# Patient Record
Sex: Male | Born: 1955 | Race: White | Hispanic: No | Smoking: Never smoker
Health system: Southern US, Community
[De-identification: ages and names within clinical notes are randomized; demographics above are authoritative.]

## PROBLEM LIST (undated history)

## (undated) DIAGNOSIS — Z8669 Personal history of other diseases of the nervous system and sense organs: Secondary | ICD-10-CM

## (undated) DIAGNOSIS — C73 Malignant neoplasm of thyroid gland: Secondary | ICD-10-CM

## (undated) DIAGNOSIS — I712 Thoracic aortic aneurysm, without rupture: Secondary | ICD-10-CM

## (undated) DIAGNOSIS — E119 Type 2 diabetes mellitus without complications: Secondary | ICD-10-CM

## (undated) DIAGNOSIS — K2970 Gastritis, unspecified, without bleeding: Secondary | ICD-10-CM

## (undated) DIAGNOSIS — B9681 Helicobacter pylori [H. pylori] as the cause of diseases classified elsewhere: Secondary | ICD-10-CM

## (undated) DIAGNOSIS — I10 Essential (primary) hypertension: Secondary | ICD-10-CM

## (undated) DIAGNOSIS — K259 Gastric ulcer, unspecified as acute or chronic, without hemorrhage or perforation: Secondary | ICD-10-CM

## (undated) DIAGNOSIS — L817 Pigmented purpuric dermatosis: Secondary | ICD-10-CM

## (undated) DIAGNOSIS — F410 Panic disorder [episodic paroxysmal anxiety] without agoraphobia: Secondary | ICD-10-CM

## (undated) DIAGNOSIS — M359 Systemic involvement of connective tissue, unspecified: Secondary | ICD-10-CM

## (undated) HISTORY — DX: Gastric ulcer, unspecified as acute or chronic, without hemorrhage or perforation: K25.9

## (undated) HISTORY — DX: Personal history of other diseases of the nervous system and sense organs: Z86.69

## (undated) HISTORY — DX: Panic disorder (episodic paroxysmal anxiety): F41.0

## (undated) HISTORY — DX: Type 2 diabetes mellitus without complications: E11.9

## (undated) HISTORY — DX: Malignant neoplasm of thyroid gland: C73

## (undated) HISTORY — DX: Essential (primary) hypertension: I10

## (undated) HISTORY — DX: Hypocalcemia: E83.51

## (undated) HISTORY — DX: Helicobacter pylori (H. pylori) as the cause of diseases classified elsewhere: B96.81

## (undated) HISTORY — DX: Gastritis, unspecified, without bleeding: K29.70

## (undated) HISTORY — DX: Thoracic aortic aneurysm, without rupture: I71.2

## (undated) HISTORY — DX: Pigmented purpuric dermatosis: L81.7

## (undated) HISTORY — PX: MENISCUS REPAIR: SHX5179

## (undated) HISTORY — PX: COLONOSCOPY: SHX174

---

## 2005-04-23 ENCOUNTER — Ambulatory Visit: Payer: Self-pay | Admitting: Internal Medicine

## 2005-09-22 ENCOUNTER — Ambulatory Visit: Payer: Self-pay | Admitting: Family Medicine

## 2006-11-22 DIAGNOSIS — Z720 Tobacco use: Secondary | ICD-10-CM | POA: Insufficient documentation

## 2006-11-26 DIAGNOSIS — K219 Gastro-esophageal reflux disease without esophagitis: Secondary | ICD-10-CM | POA: Insufficient documentation

## 2006-11-26 DIAGNOSIS — G43909 Migraine, unspecified, not intractable, without status migrainosus: Secondary | ICD-10-CM | POA: Insufficient documentation

## 2006-12-26 DIAGNOSIS — E119 Type 2 diabetes mellitus without complications: Secondary | ICD-10-CM | POA: Insufficient documentation

## 2007-08-30 ENCOUNTER — Ambulatory Visit: Payer: Self-pay | Admitting: Pulmonary Disease

## 2007-08-30 DIAGNOSIS — G610 Guillain-Barre syndrome: Secondary | ICD-10-CM

## 2007-08-30 DIAGNOSIS — Z8669 Personal history of other diseases of the nervous system and sense organs: Secondary | ICD-10-CM | POA: Insufficient documentation

## 2007-08-30 DIAGNOSIS — R51 Headache: Secondary | ICD-10-CM

## 2007-08-30 DIAGNOSIS — M129 Arthropathy, unspecified: Secondary | ICD-10-CM | POA: Insufficient documentation

## 2007-08-30 DIAGNOSIS — E785 Hyperlipidemia, unspecified: Secondary | ICD-10-CM

## 2007-08-30 DIAGNOSIS — E119 Type 2 diabetes mellitus without complications: Secondary | ICD-10-CM

## 2007-08-30 DIAGNOSIS — I10 Essential (primary) hypertension: Secondary | ICD-10-CM

## 2007-08-31 DIAGNOSIS — G2581 Restless legs syndrome: Secondary | ICD-10-CM

## 2007-09-15 DIAGNOSIS — C73 Malignant neoplasm of thyroid gland: Secondary | ICD-10-CM

## 2007-09-15 HISTORY — DX: Malignant neoplasm of thyroid gland: C73

## 2007-09-15 HISTORY — PX: THYROIDECTOMY: SHX17

## 2007-09-21 ENCOUNTER — Ambulatory Visit: Payer: Self-pay | Admitting: Pulmonary Disease

## 2008-01-12 ENCOUNTER — Ambulatory Visit: Payer: Self-pay | Admitting: Otolaryngology

## 2008-01-31 ENCOUNTER — Ambulatory Visit: Payer: Self-pay | Admitting: Unknown Physician Specialty

## 2008-01-31 ENCOUNTER — Other Ambulatory Visit: Payer: Self-pay

## 2008-02-03 ENCOUNTER — Other Ambulatory Visit: Payer: Self-pay

## 2008-02-03 ENCOUNTER — Inpatient Hospital Stay: Payer: Self-pay | Admitting: Internal Medicine

## 2008-02-12 ENCOUNTER — Other Ambulatory Visit: Payer: Self-pay

## 2008-02-12 ENCOUNTER — Emergency Department: Payer: Self-pay | Admitting: Emergency Medicine

## 2009-11-26 IMAGING — CT CT NECK WITH CONTRAST
1 of 2 series · 9 of 14 positions shown, 12 images · non-contrast
Comparison: none

REASON FOR EXAM: thyroid nodule
COMMENTS:

[Series 2: soft tissue · axial · 0.49mm/px · z∈[-178,+92]mm · 9 of 114 slices shown, 12 images]
[im 12/114  soft-tissue]
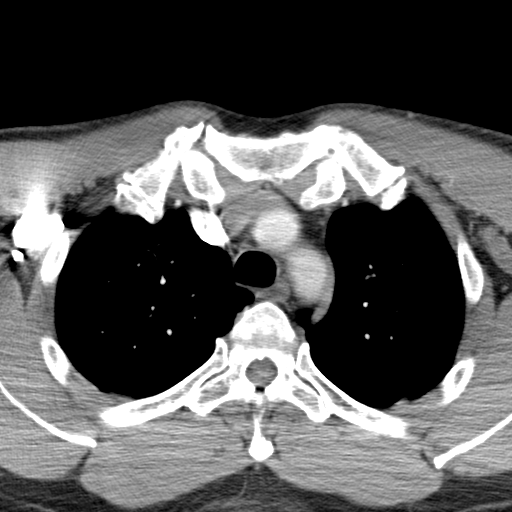
[im 12/114  bone]
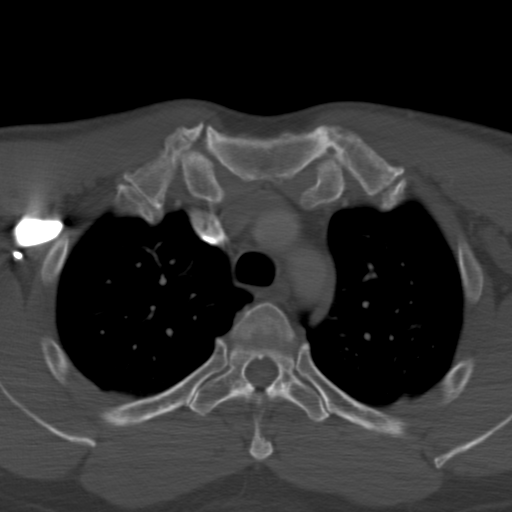
[im 23/114  bone]
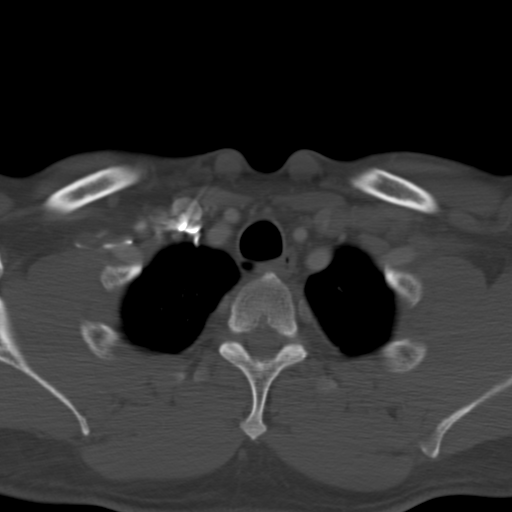
[im 34/114  bone]
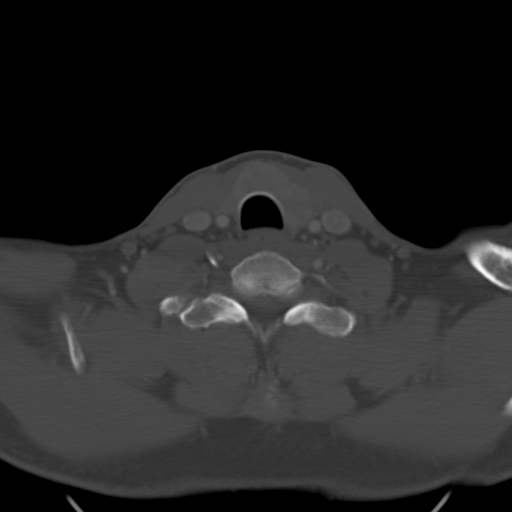
[im 46/114  bone]
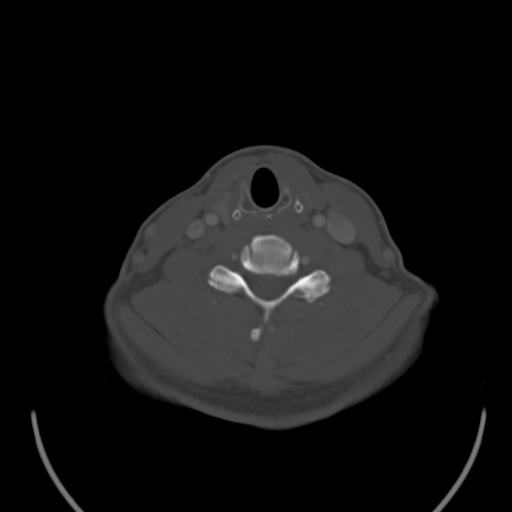
[im 57/114  soft-tissue]
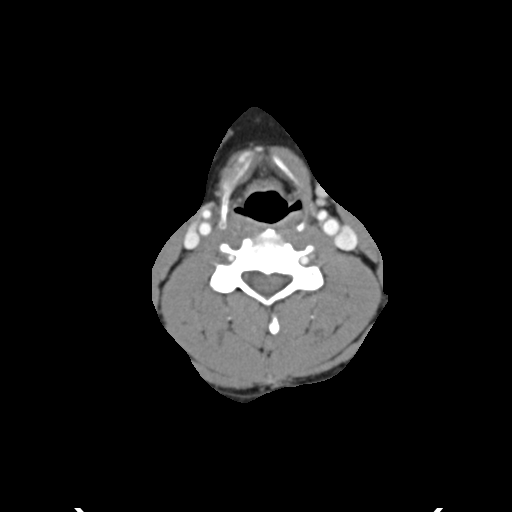
[im 57/114  bone]
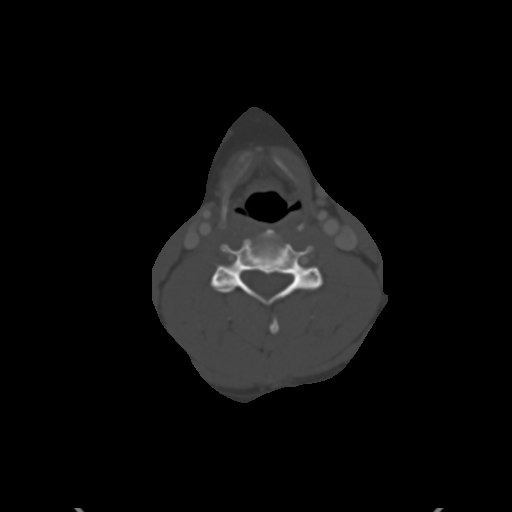
[im 68/114  bone]
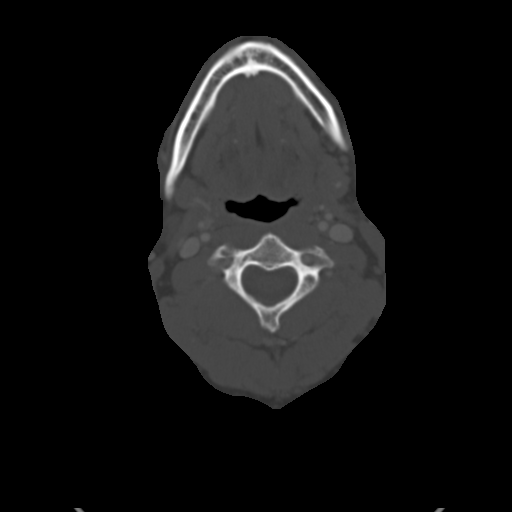
[im 80/114  bone]
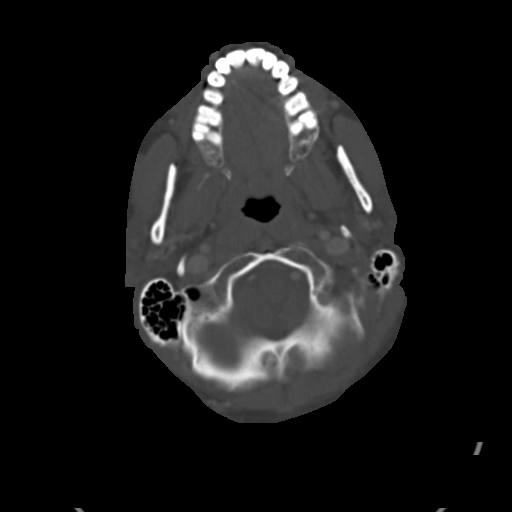
[im 91/114  bone]
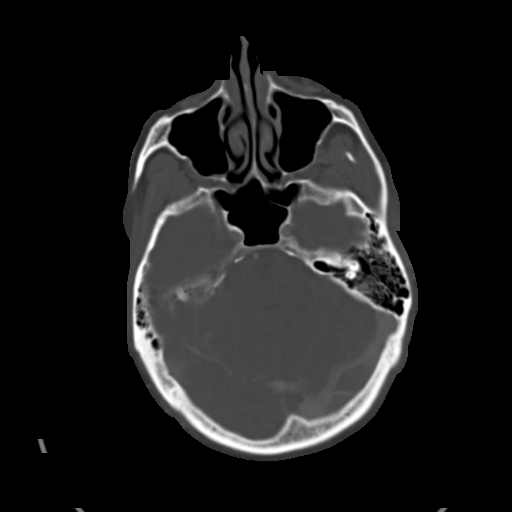
[im 102/114  soft-tissue]
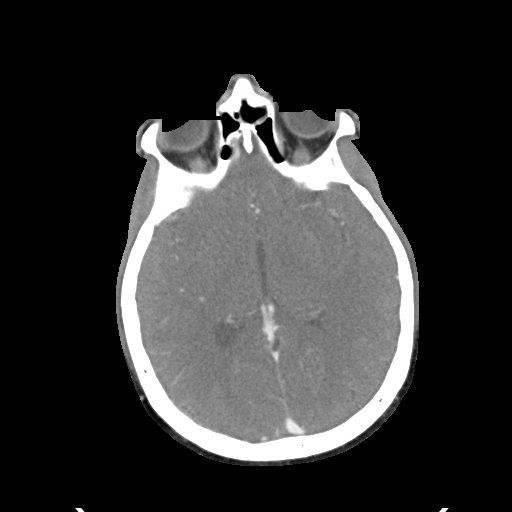
[im 102/114  bone]
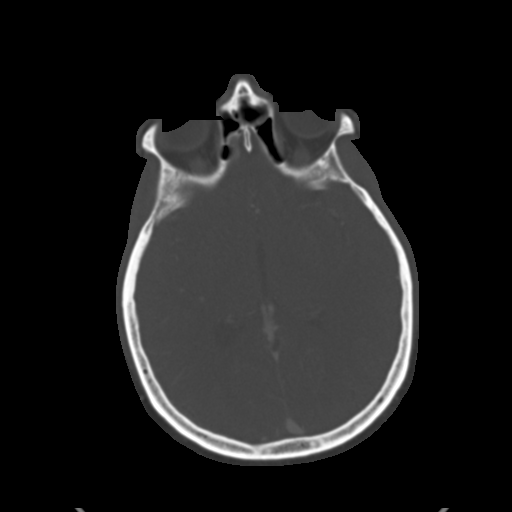

[9 of 14 positions shown; findings below may reference images not displayed]

PROCEDURE:     CT  - CT NECK WITH CONTRAST  - January 12, 2008  [DATE]

RESULT:     A radiodense marker has been placed in the lower anterior neck.
The patient received 75 ml of Wsovue-PYL.

Deep to the marker, the thyroid isthmus is seen to be enlarged. The isthmus
measures approximately 2.5 cm transversely x 1.4 cm AP. The LEFT thyroid
lobe is slightly larger than the RIGHT. There is no intrathoracic goiter.
There is no deviation of the trachea nor of the adjacent vasculature. The
density of the thyroid gland is heterogeneous. I do not see evidence of
cervical lymphadenopathy. The sternocleidomastoid muscles adjacent to the
thyroid gland are symmetric in appearance.

The parotid and submandibular glands exhibit no acute abnormality. The
nasopharyngeal structures and oropharyngeal structures are normal in
appearance. There is a small amount of mucoperiosteal thickening involving
the ethmoid sinuses. The laryngeal structures are normal in appearance.

The pulmonary apices are clear.
IMPRESSION: 1.     There is enlargement of the thyroid isthmus and an overall
heterogeneous appearance of the density of the thyroid gland. Correlation
with the patient's clinical and laboratory values would be of value. Thyroid
ultrasound may be useful as well,especially if biopsy is contemplated. No
discrete nodule is identified.
2.     I see no acute abnormality elsewhere within the neck.
3.     There is a small amount of mucoperiosteal thickening of the ethmoid
sinus cells.

## 2011-07-10 ENCOUNTER — Ambulatory Visit: Payer: Self-pay | Admitting: Orthopedic Surgery

## 2011-07-24 ENCOUNTER — Ambulatory Visit: Payer: Self-pay | Admitting: Orthopedic Surgery

## 2011-07-28 ENCOUNTER — Ambulatory Visit: Payer: Self-pay | Admitting: Orthopedic Surgery

## 2011-07-31 ENCOUNTER — Inpatient Hospital Stay: Payer: Self-pay | Admitting: Internal Medicine

## 2012-02-22 ENCOUNTER — Observation Stay: Payer: Self-pay | Admitting: Internal Medicine

## 2012-02-22 LAB — CBC
HCT: 40.8 % (ref 40.0–52.0)
MCHC: 33.5 g/dL (ref 32.0–36.0)
Platelet: 254 10*3/uL (ref 150–440)
RBC: 4.74 10*6/uL (ref 4.40–5.90)

## 2012-02-22 LAB — CK TOTAL AND CKMB (NOT AT ARMC): CK-MB: 2 ng/mL (ref 0.5–3.6)

## 2012-02-22 LAB — BASIC METABOLIC PANEL
BUN: 12 mg/dL (ref 7–18)
Co2: 29 mmol/L (ref 21–32)
Creatinine: 1.03 mg/dL (ref 0.60–1.30)
EGFR (Non-African Amer.): >60
Glucose: 133 mg/dL — ABNORMAL HIGH (ref 65–99)
Osmolality: 285 (ref 275–301)
Potassium: 4 mmol/L (ref 3.5–5.1)

## 2012-02-23 LAB — BASIC METABOLIC PANEL
Anion Gap: 10 (ref 7–16)
BUN: 16 mg/dL (ref 7–18)
Calcium, Total: 8.4 mg/dL — ABNORMAL LOW (ref 8.5–10.1)
Chloride: 104 mmol/L (ref 98–107)
EGFR (Non-African Amer.): >60
Osmolality: 284 (ref 275–301)
Potassium: 4.3 mmol/L (ref 3.5–5.1)
Sodium: 141 mmol/L (ref 136–145)

## 2012-02-23 LAB — LIPID PANEL
Cholesterol: 220 mg/dL — ABNORMAL HIGH (ref 0–200)
HDL Cholesterol: 36 mg/dL — ABNORMAL LOW (ref 40–60)
Ldl Cholesterol, Calc: 163 mg/dL — ABNORMAL HIGH (ref 0–100)
Triglycerides: 104 mg/dL (ref 0–200)
VLDL Cholesterol, Calc: 21 mg/dL (ref 5–40)

## 2012-02-23 LAB — TSH: Thyroid Stimulating Horm: 1.88 u[IU]/mL

## 2012-02-23 LAB — CK TOTAL AND CKMB (NOT AT ARMC)
CK, Total: 154 U/L (ref 35–232)
CK-MB: 1.9 ng/mL (ref 0.5–3.6)

## 2012-08-31 LAB — HM HEPATITIS C SCREENING LAB: HM Hepatitis Screen: NEGATIVE

## 2013-06-13 IMAGING — CR DG CHEST 1V PORT
1 series · 1 of 1 positions shown · non-contrast
Comparison: none

REASON FOR EXAM: Chest Pain
COMMENTS:

PROCEDURE:     DXR - DXR PORTABLE CHEST SINGLE VIEW  - July 30, 2011 [DATE]
RESULT:     Comparison is made to the prior exam of 02/05/2008. The lung
fields are clear. The heart, mediastinal and osseous structures reveal no
significant abnormalities.

[view not recorded]
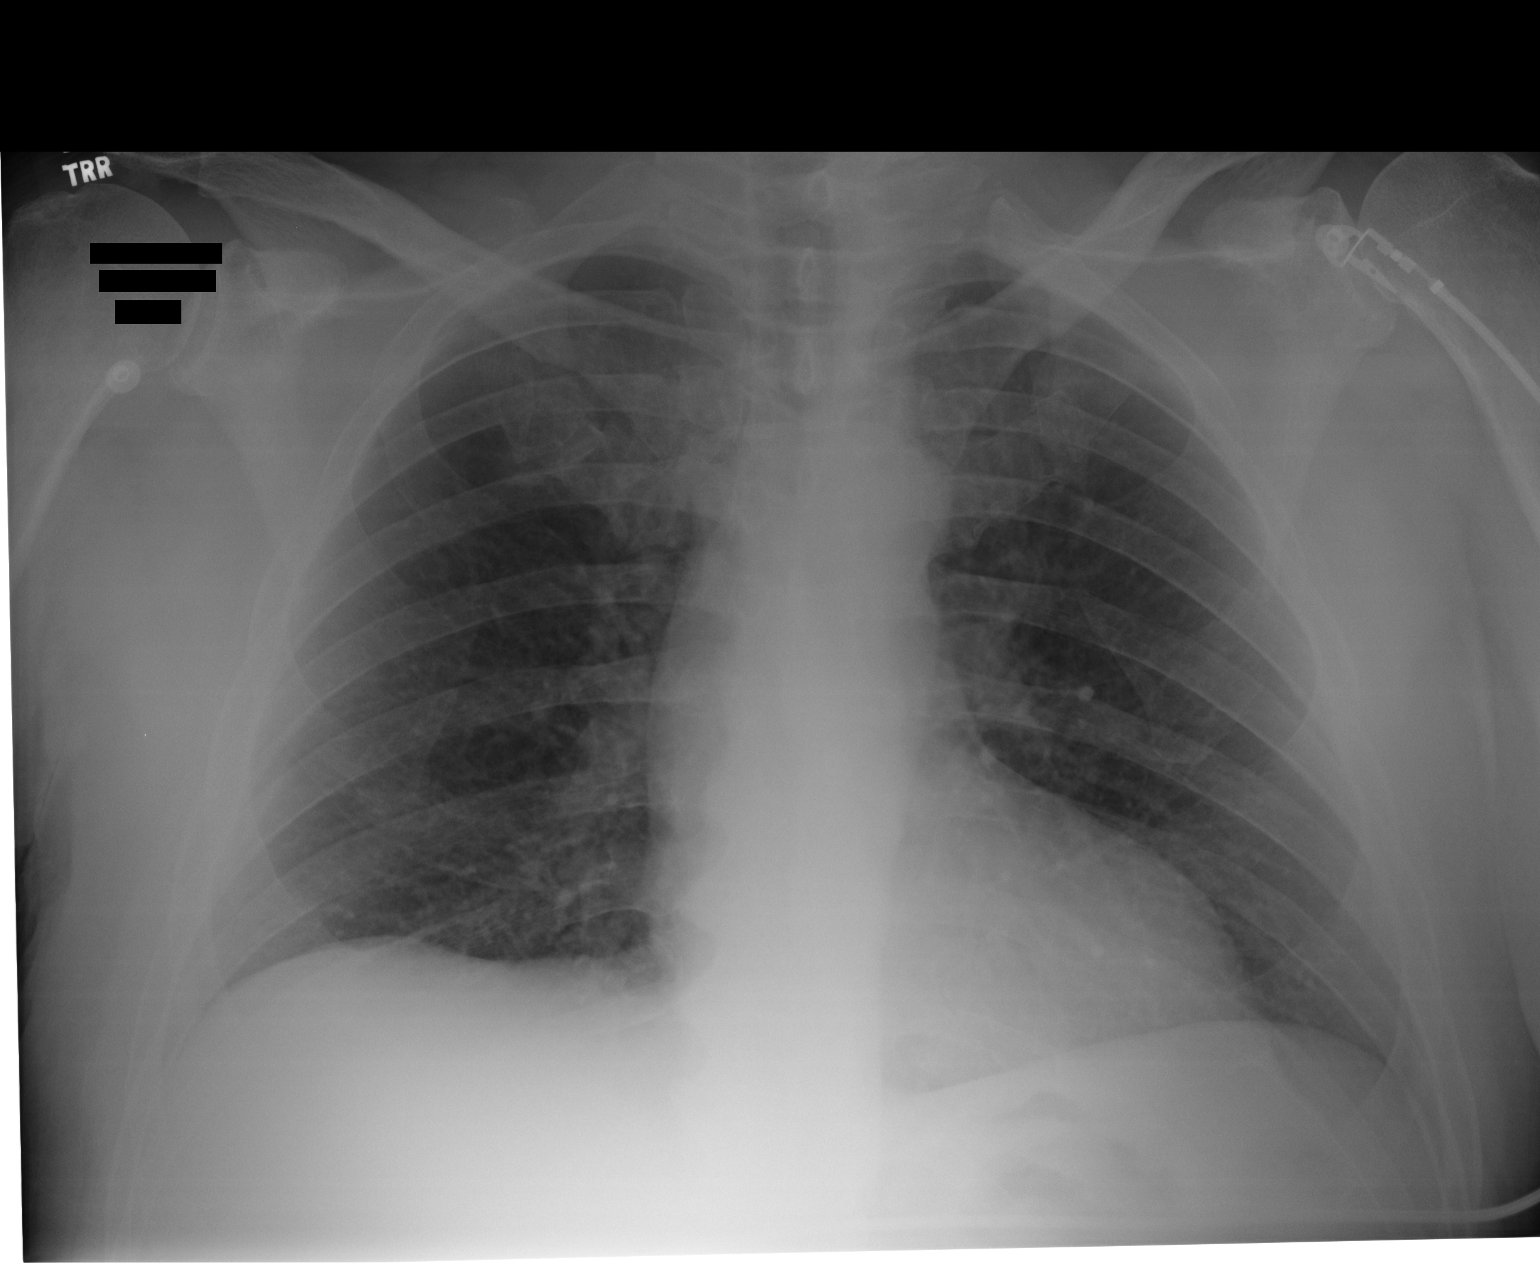

[1 of 1 positions shown; findings below may reference images not displayed]

IMPRESSION: 1.     No significant abnormalities are identified.

## 2014-04-14 DIAGNOSIS — I712 Thoracic aortic aneurysm, without rupture, unspecified: Secondary | ICD-10-CM

## 2014-04-14 HISTORY — DX: Thoracic aortic aneurysm, without rupture, unspecified: I71.20

## 2014-04-14 HISTORY — DX: Thoracic aortic aneurysm, without rupture: I71.2

## 2014-04-18 DIAGNOSIS — I7121 Aneurysm of the ascending aorta, without rupture: Secondary | ICD-10-CM | POA: Insufficient documentation

## 2014-04-18 DIAGNOSIS — R739 Hyperglycemia, unspecified: Secondary | ICD-10-CM | POA: Insufficient documentation

## 2014-04-18 DIAGNOSIS — I712 Thoracic aortic aneurysm, without rupture: Secondary | ICD-10-CM | POA: Insufficient documentation

## 2014-06-04 ENCOUNTER — Ambulatory Visit (INDEPENDENT_AMBULATORY_CARE_PROVIDER_SITE_OTHER): Payer: BC Managed Care – PPO | Admitting: Cardiovascular Disease

## 2014-06-04 ENCOUNTER — Encounter: Payer: Self-pay | Admitting: *Deleted

## 2014-06-04 ENCOUNTER — Encounter (INDEPENDENT_AMBULATORY_CARE_PROVIDER_SITE_OTHER): Payer: Self-pay

## 2014-06-04 ENCOUNTER — Encounter: Payer: Self-pay | Admitting: Cardiovascular Disease

## 2014-06-04 VITALS — BP 145/86 | HR 69 | Ht 71.0 in | Wt 236.8 lb

## 2014-06-04 DIAGNOSIS — R55 Syncope and collapse: Secondary | ICD-10-CM

## 2014-06-04 DIAGNOSIS — I208 Other forms of angina pectoris: Secondary | ICD-10-CM | POA: Insufficient documentation

## 2014-06-04 DIAGNOSIS — I7121 Aneurysm of the ascending aorta, without rupture: Secondary | ICD-10-CM | POA: Insufficient documentation

## 2014-06-04 DIAGNOSIS — I712 Thoracic aortic aneurysm, without rupture, unspecified: Secondary | ICD-10-CM

## 2014-06-04 DIAGNOSIS — I2089 Other forms of angina pectoris: Secondary | ICD-10-CM | POA: Insufficient documentation

## 2014-06-04 DIAGNOSIS — R079 Chest pain, unspecified: Secondary | ICD-10-CM

## 2014-06-04 DIAGNOSIS — Z01812 Encounter for preprocedural laboratory examination: Secondary | ICD-10-CM

## 2014-06-04 DIAGNOSIS — I209 Angina pectoris, unspecified: Secondary | ICD-10-CM

## 2014-06-04 DIAGNOSIS — I10 Essential (primary) hypertension: Secondary | ICD-10-CM

## 2014-06-04 MED ORDER — ASPIRIN EC 81 MG PO TBEC: 81.0000 mg | DELAYED_RELEASE_TABLET | Freq: Every day | ORAL | Status: DC

## 2014-06-04 NOTE — Assessment & Plan Note (Signed)
This is currently being followed at [facility].

## 2014-06-04 NOTE — Assessment & Plan Note (Signed)
The patient's symptoms are highly suggestive of class III angina which started in August. This has been progressive. Given his symptoms, I recommend proceeding with cardiac catheterization and possible coronary intervention. Risks, benefits and alternatives including stress testing were discussed with him.

## 2014-06-04 NOTE — Assessment & Plan Note (Signed)
Blood pressure is reasonably controlled but we might need to be more aggressive given his diagnosis of thoracic aortic aneurysm. I might consider increasing lisinopril or switching metoprolol to carvedilol.

## 2014-06-04 NOTE — Progress Notes (Signed)
Primary care physician: Dr. Venia Minks  HPI  This is a 58 year old man who was referred for evaluation of chest pain and shortness of breath. He reports no previous cardiac history. He has known history of hypertension, hyperlipidemia, newly diagnosed diabetes and Guillain -Barre syndrome. He was hospitalized at [facility] in early August with chest pain and syncope. He was found to be severely hypertensive on presentation with blood pressure 220/138 .  These records are not available but it appears that he ruled out for myocardial infarction. Echocardiogram showed dilated aorta and he was later confirmed to have a thoracic aortic aneurysm measuring 4.4 cm. He is now following up with Dr. Sammuel Hines for this. Since his hospitalization, he reports continued symptoms of substernal chest tightness radiating to his left arm and associated with significant shortness of breath. This is currently happening with any minimal activities and has been progressive. This is unusual for him as he has been active throughout his life. He is a Careers adviser. He denies any previous similar symptoms. He does have family history of coronary artery disease. He is not a smoker.   Allergies  Allergen Reactions  . Declomycin [Demeclocycline]   . Ginger   . Remicade [Infliximab]      No current outpatient prescriptions on file prior to visit.   No current facility-administered medications on file prior to visit.     Past Medical History  Diagnosis Date  . Schamberg disease   . Paresthesia of foot   . Nausea   . Hypocalcemia   . H/O Guillain-Barre syndrome   . Dysthymic disorder   . Psoriatic arthritis   . Hyperlipidemia   . Hypothyroidism   . Gastric ulcer   . Esophageal reflux   . Genital disorder, male   . Insomnia   . Panic disorder   . Colonic constipation   . Vitamin D deficiency   . Syncope and collapse   . Diabetes mellitus without complication   . Thoracic aortic aneurysm     4.4 cm followed at [facility]  .  Essential hypertension      Past Surgical History  Procedure Laterality Date  . Thyroidectomy    . Meniscus repair Right      Family History  Problem Relation Age of Onset  . Hyperlipidemia Mother   . Heart attack Father 4  . Hypertension Father   . Hyperlipidemia Father   . Heart failure Father      History   Social History  . Marital Status: Married    Spouse Name: N/A    Number of Children: N/A  . Years of Education: N/A   Occupational History  . Not on file.   Social History Main Topics  . Smoking status: Never Smoker   . Smokeless tobacco: Not on file  . Alcohol Use: No  . Drug Use: No  . Sexual Activity: Not on file   Other Topics Concern  . Not on file   Social History Narrative  . No narrative on file     ROS A 10 point review of system was performed. It is negative other than that mentioned in the history of present illness.   PHYSICAL EXAM   BP 145/86  Pulse 69  Ht 5\' 11"  (1.803 m)  Wt 236 lb 12 oz (107.389 kg)  BMI 33.03 kg/m2 Constitutional: He is oriented to person, place, and time. He appears well-developed and well-nourished. No distress.  HENT: No nasal discharge.  Head: Normocephalic and atraumatic.  Eyes: Pupils  are equal and round.  No discharge. Neck: Normal range of motion. Neck supple. No JVD present. No thyromegaly present.  Cardiovascular: Normal rate, regular rhythm, normal heart sounds. Exam reveals no gallop and no friction rub. No murmur heard.  Pulmonary/Chest: Effort normal and breath sounds normal. No stridor. No respiratory distress. He has no wheezes. He has no rales. He exhibits no tenderness.  Abdominal: Soft. Bowel sounds are normal. He exhibits no distension. There is no tenderness. There is no rebound and no guarding.  Musculoskeletal: Normal range of motion. He exhibits no edema and no tenderness.  Neurological: He is alert and oriented to person, place, and time. Coordination normal.  Skin: Skin is warm and  dry. No rash noted. He is not diaphoretic. No erythema. No pallor.  Psychiatric: He has a normal mood and affect. His behavior is normal. Judgment and thought content normal.       FXJ:OITGP  Rhythm  WITHIN NORMAL LIMITS   ASSESSMENT AND PLAN

## 2014-06-04 NOTE — Patient Instructions (Addendum)
[facility] Cardiac Cath Instructions   You are scheduled for a Cardiac Cath on:_________________________  Please arrive at _______am on the day of your procedure  You will need to pre-register prior to the day of your procedure.  Enter through the [employer] at [facility].  Registration is the first desk on your right.  Please take the procedure order we have given you in order to be registered appropriately  Do not eat/drink anything after midnight  Someone will need to drive you home  It is recommended someone be with you for the first 24 hours after your procedure  Wear clothes that are easy to get on/off and wear slip on shoes if possible   Medications bring a current list of all medications with you    Day of your procedure: Arrive at the [facility] entrance.  Free valet service is available.  After entering the [facility] please check-in at the registration desk (1st desk on your right) to receive your armband. After receiving your armband someone will escort you to the cardiac cath/special procedures waiting area.  The usual length of stay after your procedure is about 2 to 3 hours.  This can vary.  If you have any questions, please call our office at [phone], or you may call the cardiac cath lab at [facility] directly at [phone]  Your physician recommends that you have labs today:  BMP CBC INR  Your physician has recommended you make the following change in your medication:  Aspirin 81 mg once daily

## 2014-06-05 LAB — BASIC METABOLIC PANEL
BUN/Creatinine Ratio: 20 (ref 9–20)
BUN: 18 mg/dL (ref 6–24)
CHLORIDE: 96 mmol/L — AB (ref 97–108)
CO2: 24 mmol/L (ref 18–29)
Calcium: 8.9 mg/dL (ref 8.7–10.2)
Creatinine, Ser: 0.92 mg/dL (ref 0.76–1.27)
GFR calc Af Amer: 107 mL/min/{1.73_m2} (ref >59)
GFR calc non Af Amer: 93 mL/min/{1.73_m2} (ref >59)
Glucose: 222 mg/dL — ABNORMAL HIGH (ref 65–99)
Potassium: 5 mmol/L (ref 3.5–5.2)
SODIUM: 139 mmol/L (ref 134–144)

## 2014-06-05 LAB — CBC WITH DIFFERENTIAL
Basophils Absolute: 0 10*3/uL (ref 0.0–0.2)
Basos: 0 %
Eos: 3 %
Eosinophils Absolute: 0.2 10*3/uL (ref 0.0–0.4)
HCT: 40.7 % (ref 37.5–51.0)
Hemoglobin: 14.4 g/dL (ref 12.6–17.7)
IMMATURE GRANS (ABS): 0 10*3/uL (ref 0.0–0.1)
IMMATURE GRANULOCYTES: 0 %
Lymphocytes Absolute: 2.4 10*3/uL (ref 0.7–3.1)
Lymphs: 26 %
MCH: 29.6 pg (ref 26.6–33.0)
MCHC: 35.4 g/dL (ref 31.5–35.7)
MCV: 84 fL (ref 79–97)
MONOS ABS: 0.5 10*3/uL (ref 0.1–0.9)
Monocytes: 5 %
NEUTROS PCT: 66 %
Neutrophils Absolute: 6.2 10*3/uL (ref 1.4–7.0)
PLATELETS: 283 10*3/uL (ref 150–379)
RBC: 4.87 x10E6/uL (ref 4.14–5.80)
RDW: 13.3 % (ref 12.3–15.4)
WBC: 9.3 10*3/uL (ref 3.4–10.8)

## 2014-06-05 LAB — PROTIME-INR
INR: 1 (ref 0.8–1.2)
Prothrombin Time: 10.6 s (ref 9.1–12.0)

## 2014-06-06 ENCOUNTER — Telehealth: Payer: Self-pay | Admitting: *Deleted

## 2014-06-06 NOTE — Telephone Encounter (Signed)
Cardiac cath orders faxed  Ebony Hail confirmed receipt

## 2014-06-07 ENCOUNTER — Ambulatory Visit: Payer: Self-pay | Admitting: Cardiovascular Disease

## 2014-06-07 DIAGNOSIS — I209 Angina pectoris, unspecified: Secondary | ICD-10-CM

## 2014-06-07 HISTORY — PX: CARDIAC CATHETERIZATION: SHX172

## 2014-06-12 ENCOUNTER — Encounter: Payer: Self-pay | Admitting: Cardiovascular Disease

## 2014-06-21 ENCOUNTER — Encounter: Payer: Self-pay | Admitting: Cardiovascular Disease

## 2014-06-21 ENCOUNTER — Ambulatory Visit (INDEPENDENT_AMBULATORY_CARE_PROVIDER_SITE_OTHER): Payer: BC Managed Care – PPO | Admitting: Cardiovascular Disease

## 2014-06-21 VITALS — BP 113/76 | HR 61 | Ht 71.0 in | Wt 241.2 lb

## 2014-06-21 DIAGNOSIS — I712 Thoracic aortic aneurysm, without rupture, unspecified: Secondary | ICD-10-CM

## 2014-06-21 DIAGNOSIS — I10 Essential (primary) hypertension: Secondary | ICD-10-CM

## 2014-06-21 DIAGNOSIS — Z9889 Other specified postprocedural states: Secondary | ICD-10-CM

## 2014-06-21 DIAGNOSIS — R079 Chest pain, unspecified: Secondary | ICD-10-CM

## 2014-06-21 NOTE — Assessment & Plan Note (Signed)
This is being followed at [facility].

## 2014-06-21 NOTE — Progress Notes (Signed)
Primary care physician: Dr. Venia Minks  HPI  This is a 58 year old man who was dizzy today for a followup visit regarding  chest pain and shortness of breath.  He has known history of hypertension, hyperlipidemia, newly diagnosed diabetes and Guillain -Barre syndrome. He was hospitalized at [facility] in early August with chest pain and syncope. He was found to be severely hypertensive on presentation with blood pressure 220/138 .    Echocardiogram showed dilated aorta and he was later confirmed to have a thoracic aortic aneurysm measuring 4.4 cm. He is now following up with Dr. Sammuel Hines for this. He continued to have substernal chest pain and shortness of breath with activities. Thus, I proceeded with cardiac catheterization which showed normal coronary arteries and ejection fraction. He actually reports improvement in symptoms since that time with resolution of chest pain and significant improvement in dyspnea.   Allergies  Allergen Reactions  . Declomycin [Demeclocycline]   . Ginger   . Remicade [Infliximab]      Current Outpatient Prescriptions on File Prior to Visit  Medication Sig Dispense Refill  . amLODipine (NORVASC) 5 MG tablet Take 5 mg by mouth daily.      Marland Kitchen aspirin EC 81 MG tablet Take 1 tablet (81 mg total) by mouth daily.  90 tablet  3  . atorvastatin (LIPITOR) 10 MG tablet Take 10 mg by mouth daily.      . DULoxetine (CYMBALTA) 60 MG capsule Take 60 mg by mouth daily.      . fentaNYL (DURAGESIC - DOSED MCG/HR) 50 MCG/HR Place 50 mcg onto the skin every 3 (three) days.      Marland Kitchen gabapentin (NEURONTIN) 600 MG tablet Take 600 mg by mouth at bedtime.      Marland Kitchen HYDROcodone-acetaminophen (NORCO/VICODIN) 5-325 MG per tablet Take 1 tablet by mouth 2 (two) times daily as needed for moderate pain.      Marland Kitchen levothyroxine (SYNTHROID, LEVOTHROID) 137 MCG tablet Take 137 mcg by mouth daily before breakfast.      . lisinopril (PRINIVIL,ZESTRIL) 10 MG tablet Take 10 mg by mouth daily.      . metoprolol  (LOPRESSOR) 50 MG tablet Take 50 mg by mouth 2 (two) times daily.      . mirtazapine (REMERON) 30 MG tablet Take 30 mg by mouth at bedtime.      . nabumetone (RELAFEN) 500 MG tablet Take 500 mg by mouth 2 (two) times daily as needed.      . zolpidem (AMBIEN) 10 MG tablet Take 10 mg by mouth at bedtime.       No current facility-administered medications on file prior to visit.     Past Medical History  Diagnosis Date  . Schamberg disease   . Paresthesia of foot   . Nausea   . Hypocalcemia   . H/O Guillain-Barre syndrome   . Dysthymic disorder   . Psoriatic arthritis   . Hyperlipidemia   . Hypothyroidism   . Gastric ulcer   . Esophageal reflux   . Genital disorder, male   . Insomnia   . Panic disorder   . Colonic constipation   . Vitamin D deficiency   . Syncope and collapse   . Diabetes mellitus without complication   . Thoracic aortic aneurysm     4.4 cm followed at [facility]  . Essential hypertension      Past Surgical History  Procedure Laterality Date  . Thyroidectomy    . Meniscus repair Right   . Cardiac catheterization  06/07/2014  [facility]     Family History  Problem Relation Age of Onset  . Hyperlipidemia Mother   . Heart attack Father 64  . Hypertension Father   . Hyperlipidemia Father   . Heart failure Father      History   Social History  . Marital Status: Married    Spouse Name: N/A    Number of Children: N/A  . Years of Education: N/A   Occupational History  . Not on file.   Social History Main Topics  . Smoking status: Never Smoker   . Smokeless tobacco: Not on file  . Alcohol Use: No  . Drug Use: No  . Sexual Activity: Not on file   Other Topics Concern  . Not on file   Social History Narrative  . No narrative on file     ROS A 10 point review of system was performed. It is negative other than that mentioned in the history of present illness.   PHYSICAL EXAM   BP 113/76  Pulse 61  Ht 5\' 11"  (1.803 m)  Wt 241 lb 4 oz  (109.43 kg)  BMI 33.66 kg/m2 Constitutional: He is oriented to person, place, and time. He appears well-developed and well-nourished. No distress.  HENT: No nasal discharge.  Head: Normocephalic and atraumatic.  Eyes: Pupils are equal and round.  No discharge. Neck: Normal range of motion. Neck supple. No JVD present. No thyromegaly present.  Cardiovascular: Normal rate, regular rhythm, normal heart sounds. Exam reveals no gallop and no friction rub. No murmur heard.  Pulmonary/Chest: Effort normal and breath sounds normal. No stridor. No respiratory distress. He has no wheezes. He has no rales. He exhibits no tenderness.  Abdominal: Soft. Bowel sounds are normal. He exhibits no distension. There is no tenderness. There is no rebound and no guarding.  Musculoskeletal: Normal range of motion. He exhibits no edema and no tenderness.  Neurological: He is alert and oriented to person, place, and time. Coordination normal.  Skin: Skin is warm and dry. No rash noted. He is not diaphoretic. No erythema. No pallor.  Psychiatric: He has a normal mood and affect. His behavior is normal. Judgment and thought content normal.  Right radial pulse is normal with no hematoma     APO:LIDCV  Rhythm  -First degree A-V block  PRi = 224 -Anterolateral ST-elevation -repolarization variant.   BORDERLINE RHYTHM   ASSESSMENT AND PLAN

## 2014-06-21 NOTE — Assessment & Plan Note (Signed)
Cardiac catheterization showed normal coronary arteries. He reports complete resolution in symptoms. No further cardiac workup is recommended. He can followup as needed.

## 2014-06-21 NOTE — Assessment & Plan Note (Signed)
His blood pressure seems to be optimally controlled at the present time which is important given the presence of aortic aneurysm.

## 2014-06-21 NOTE — Patient Instructions (Signed)
Continue same medications.   Follow up as needed.  

## 2014-07-03 ENCOUNTER — Encounter: Payer: Self-pay | Admitting: Cardiovascular Disease

## 2014-07-17 ENCOUNTER — Telehealth: Payer: Self-pay | Admitting: Cardiovascular Disease

## 2014-07-17 NOTE — Telephone Encounter (Signed)
Pt had a aortic aneurism, wanted to know if they can do dental treatment on him, such as using epinephrine. Please advise

## 2014-07-17 NOTE — Telephone Encounter (Signed)
Yes, that's fine 

## 2014-07-18 NOTE — Telephone Encounter (Signed)
LVM 11/04

## 2014-07-18 NOTE — Telephone Encounter (Signed)
Informed patient of Dr. Jacklynn Ganong response  Patient verbalized understanding

## 2014-07-23 ENCOUNTER — Telehealth: Payer: Self-pay

## 2014-07-23 NOTE — Telephone Encounter (Signed)
Dental office needs fax clearance note for pt to have dental procedure. Please fax [phone]

## 2014-07-24 ENCOUNTER — Encounter: Payer: Self-pay | Admitting: *Deleted

## 2014-07-24 NOTE — Telephone Encounter (Signed)
He is low risk from a cardiac standpoint. No restrictions. No need for antibiotics.

## 2014-07-24 NOTE — Telephone Encounter (Signed)
Clearance faxed as ordered

## 2014-08-30 LAB — HEPATIC FUNCTION PANEL
ALT: 39 U/L (ref 10–40)
AST: 25 U/L (ref 14–40)

## 2014-08-30 LAB — BASIC METABOLIC PANEL
BUN: 17 mg/dL (ref 4–21)
Creatinine: 1 mg/dL (ref <=1.3)
Glucose: 128 mg/dL
Potassium: 4.9 mmol/L (ref 3.4–5.3)
SODIUM: 139 mmol/L (ref 137–147)

## 2014-12-05 LAB — HEMOGLOBIN A1C: Hgb A1c MFr Bld: 7.6 % — AB (ref 4.0–6.0)

## 2015-01-06 NOTE — H&P (Signed)
PATIENT NAME:  Keith Carpenter, Keith Carpenter MR#:  [id number] DATE OF BIRTH:  08/09/1956  DATE OF ADMISSION:  02/22/2012  REFERRING PHYSICIAN: Alger Simons, MD   PRIMARY CARE PHYSICIAN: Margarita Rana, MD   PRESENTING COMPLAINT: Chest pressure and elevated blood pressure.   HISTORY OF PRESENT ILLNESS: Keith Carpenter is a 59 year old gentleman with history of Guillain-Barre syndrome, psoriasis and psoriatic arthritis, history of hypothyroidism, hypertension, chronic pain, depression, anxiety, and panic disorder who presents from [facility] with reports of chest pressure, diaphoresis, nausea, and elevated blood pressure. He apparently reports that his symptoms of chest pressure began approximately one week ago, would occur at rest or on exertion. It would be intermittent. Symptoms would wax and wane. He had associated diaphoresis but thought that that was his usual hot flashes. He presented to Dr. Selina Cooley office today for evaluation of his feet arthritis and felt chest pressure, nausea, and diaphoresis and asked for his blood pressure to be checked. He was found to have an elevated blood pressure of 180's over 90's, was sent over to the ED for further evaluation. On presentation here, he was hypertensive with peak systolic pressure in the 086'V and peak diastolic in the 784'O. He reports still having some intermittent chest pressure. He also endorsed near syncopal spell but no syncope. No palpitations. Denies any lower extremity edema. No similar episodes in the past.   PAST MEDICAL HISTORY:  1. Admitted in May 2009 in the setting of acute respiratory failure requiring intubation with tetany hypercalcemia, hypomagnesemia.  2. Guillain-Barre syndrome with admission in November of 2012 for suspected flare.  3. Psoriasis with psoriatic arthritis.  4. Hypothyroidism. History of multinodular goiter status post thyroidectomy.  5. Hypertension.  6. Depression, anxiety, and panic disorder. 7. Neuropathy.  8. Chronic  pain.    PAST SURGICAL HISTORY:  1. Thyroidectomy.  2. Left thumb surgery.  3. Left foot surgery.  4. Right knee surgery.   ALLERGIES: Declomycin, Remicade, and anesthesia.   MEDICATIONS:  1. Acetaminophen/hydrocodone 5/500 1 tablet daily as needed.  2. Cymbalta 60 mg in the morning.  3. Fentanyl patch 50 mcg per hour every two days.  4. Lisinopril 5 mg daily.  5. Neurontin 600 mg at bedtime.  6. Relafen 1000 mg q.a.m.  7. Remeron 30 mg at bedtime.  8. Synthroid 150 mcg q.a.m.   SOCIAL HISTORY: Lives in [place] alone. Denies any tobacco, alcohol, or drug use. He is employed as a Pharmacist, hospital.   FAMILY HISTORY: Father died in his 4's of heart disease and diabetes. Mother died in her 67's, had brain cancer. Sister recently had a stroke and was in her 33's.   REVIEW OF SYSTEMS: CONSTITUTIONAL: No fever. He endorses nausea. EYES: No cataracts. ENT: No epistaxis, discharge. RESPIRATORY: No hemoptysis or cough. CARDIOVASCULAR: As per history of present illness. GI: Endorses nausea but no vomiting. No diarrhea, abdominal pain, hematemesis, or melena. GU: Reports loss of sensation for urination but has cues with sweating in his lower spine when he has to go to the bathroom. ENDOCRINE: No polyuria or polydipsia. HEME: No bleeding. SKIN: He has rash from his psoriasis on his elbow, buttocks, and groin. MUSCULOSKELETAL: He has chronic pain with his arthritis. NEUROLOGIC: No history of stroke or seizure. PSYCH: Denies any suicidal ideation. He does have history of depression and anxiety.   PERTINENT LABS AND STUDIES: Troponin less than 0.02. WBC 6.9, hemoglobin 13.7, hematocrit 40.8, platelets 254, MCV 86, glucose 133, BUN 12, creatinine 1.03, sodium 142, potassium 4, chloride 104,  carbon dioxide 29, calcium 8.6. CK 163. MB 2.   EKG with sinus rate of 74. No ST elevation or depression. There is an incomplete right bundle branch.   ASSESSMENT AND PLAN: Keith Carpenter is a 59 year old gentleman with history  of Guillain-Barre syndrome, psoriasis arthritis, chronic pain, hypothyroidism, depression, anxiety, hypertension, neuropathy, and esophagitis presenting with complaints of chest pressure, nausea, diaphoresis, elevated blood pressure, and some presyncope.  1. Chest pain with typical and atypical features. EKG without evidence of ischemia. First cardiac enzymes were negative. Will continue on tele. Cycle cardiac enzymes. He does have risk factors. Will obtain a Myoview. Start on beta-blocker, aspirin, and nitro. Continue oxygen. Morphine as needed. Will send fasting lipid panel, A1c, and TSH for risk stratification. His blood sugar is mildly elevated likely in the setting of stress.  2. Hypertension, accelerated, also likely perpetuated by pain, currently improved. Will be adding beta-blocker and continue nitro. Resume his lisinopril.  3. Hypothyroidism. Restart Synthroid. As above, send TSH. 4. Depression/anxiety also likely exacerbating symptoms. Restart his Cymbalta and Remeron.  5. Prophylaxis with aspirin and Protonix.   TIME SPENT: Approximately 50 minutes spent on patient care.   ____________________________ Rita Ohara, MD ap:drc D: 02/22/2012 23:07:14 ET T: 02/23/2012 07:08:15 ET JOB#: [id number]  cc: Brien Few Anne-Marie Genson, MD, <Dictator> Jerrell Belfast, MD Rita Ohara MD ELECTRONICALLY SIGNED 03/08/2012 5:59

## 2015-01-06 NOTE — Discharge Summary (Signed)
PATIENT NAME:  Keith Carpenter, Keith Carpenter MR#:  [id number] DATE OF BIRTH:  Nov 17, 1955  DATE OF ADMISSION:  02/22/2012 DATE OF DISCHARGE:  02/23/2012  ADMITTING DIAGNOSIS: Chest pain and elevated blood pressure.   DISCHARGE DIAGNOSES:  1. Chest pain, noncardiac in origin, status post stress test and Lexi MIBI which is negative.  2. Hyperlipidemia. The patient needs dietary control. If he continues to have elevated cholesterol, needs lipid-lowering regimen.  3. Accelerated hypertension. Blood pressure improved with metoprolol.  4. History of respiratory failure requiring intubation with tetany hypercalcemia and hypomagnesemia.  5. Guillain-Barre syndrome with admission November 2012 for suspected flare.  6. Psoriasis with psoriatic arthritis.  7. Hypothyroidism with history of multinodular goiter status post thyroidectomy.  8. Hypertension.  9. Depression/anxiety/panic disorder.  10. Neuropathy.  11. Chronic pain.  12. Status post thyroidectomy.  13. Status post left thumb surgery.  14. Status post left foot surgery.  15. Status post right knee surgery.   PERTINENT LABS/EVALUATIONS: Troponin was less than 0.02 x3. WBC 6.9, hemoglobin 13.7, AND platelet count 254. CPK 163 and CK-MB 2.   EKG: Sinus rhythm without any ST-T wave changes, incomplete right bundle branch block.  Fasting lipid panel: Total cholesterol 220, triglycerides 104, HDL 36.   Hemoglobin A1c 6.5.   Exercise MIBI shows no evidence of ischemia or infarct, normal ejection fraction.   HOSPITAL COURSE: Please refer to the history and physical done by the admitting physician. The patient is a 59 year old white male with the above stated medical history who presented to Dr. Selina Cooley office today for evaluation of his feet, arthritis, and was having chest pressure and nausea and diaphoresis. His blood pressure was elevated into the 180s over 90s. Therefore, he was referred to the ED. In the ER his evaluation was negative. His blood  pressure was a little elevated. He was placed under observation and a stress MIBI was done. Stress MIBI was negative. The patient's symptoms are atypical, possibly musculoskeletal in nature. His blood pressure is also improved with additional metoprolol. At this time, he is doing well and is stable for discharge.   DISCHARGE MEDICATIONS: 1. Lisinopril 5 mg daily.  2. Fentanyl transdermal patch 50 mcg, change every two days. 3. Synthroid 150 mcg daily.  4. Relafen 500 mg 2 tabs daily.  5. Remeron 30 mg daily.  6. Neurontin 600 mg daily.  7. Cymbalta 60 mg daily.  8. Acetaminophen/hydrocodone 500/5 mg as needed.  9. Prednisone 60 mg daily.  10. Metoprolol tartrate 50 mg p.o. every 12 hours.   HOME OXYGEN: None.   DIET: Low sodium, low fat.   ACTIVITY: As tolerated.   DISCHARGE FOLLOWUP: Follow up with primary MD, Dr. Margarita Rana, in 1 to 2 weeks.   TIME SPENT ON DISCHARGE: 25 minutes. ____________________________ Lafonda Mosses Posey Pronto, MD shp:slb D: 02/23/2012 15:45:54 ET     T: 02/24/2012 10:17:13 ET       JOB#: [id number] cc: Jerrell Belfast, MD Alric Seton MD ELECTRONICALLY SIGNED 03/02/2012 11:41

## 2015-01-06 NOTE — Discharge Summary (Signed)
PATIENT NAME:  Keith Carpenter, Keith Carpenter MR#:  [id number] DATE OF BIRTH:  Sep 24, 1955  DATE OF ADMISSION:  02/22/2012 DATE OF DISCHARGE:  02/23/2012  ADMITTING DIAGNOSES: Chest pain, elevated blood pressure.   DISCHARGE DIAGNOSES:  1. Chest pain, noncardiac in origin, status post stress MIBI with negative evidence of ischemia or infarct. Consultations none.   2. Accelerated hypertension, now improved with antihypertensives.  3. History of acute respiratory failure requiring intubation with tetany, hypercalcemia and hypomagnesium in May 2009.  4. Guillain-Barre syndrome with admission November 2012 for suspected flare.  5. Psoriasis with psoriatic arthritis.  6. Hypothyroidism with history of multinodular goiter status post thyroidectomy.  7. Hypertension.  8. Depression, anxiety, panic disorder.  9. Neuropathy.  10. Chronic pain.  11. Status post thyroidectomy.  12. Status post left thumb surgery.  13. Status post left foot surgery.  14. Status post right knee surgery.   LABORATORY, DIAGNOSTIC AND RADIOLOGICAL DATA: Troponin was less than 0.02 x 3. WBC 6.9, hemoglobin 13.7, platelets 254, BUN 12, creatinine 1.03, sodium 142, potassium 4. CPK 163, CK-MB 2. EKG with sinus rate of 74. No ST elevation or depression. Incomplete right bundle branch block. Stress MIBI showed no evidence of ischemia or infarct.   HOSPITAL COURSE: Please refer to history and physical done by the admitting physician. The patient is a 59 year old white male with history of Gullain-Barre syndrome, psoriasis, psoriatic arthritis, hypothyroidism, hypertension, chronic pain, depression, anxiety, panic disorder who presented from podiatry clinic with report of chest pressure, was noted to have elevated blood pressure in the 180s in the office. We were asked to admit the patient for these symptoms. His evaluation and the EKG was unrevealing due to his symptoms although they were atypical, they were concerning. Therefore, he was placed  on observation overnight, had a stress MIBI in the morning, which was negative for ischemia or infarct. The patient is currently chest pain free and is stable for discharge.   DISCHARGE MEDICATIONS:  1. Lisinopril 5 daily.  2. Fentanyl 50 mcg, change every 72 hours. 3. Synthroid 150 micrograms daily.  4. Relafen 500, two tabs daily.  5. Remeron 30, one tab p.o. at bedtime.  6. Neurontin 600, one tab p.o. at bedtime.  7. Cymbalta 60 daily.  8. Acetaminophen/hydrocodone one tablet daily as needed.  9. Prednisone 60 p.o. daily.  10. Metoprolol tartrate 25 p.o. every 12 hours.   HOME OXYGEN: None.   DIET: Low sodium.   ACTIVITY: As tolerated.   TIMEFRAME FOR FOLLOW-UP: 1 to 2 weeks with primary MD, Dr. Venia Minks.   TIME SPENT: 32 minutes.   ____________________________ Lafonda Mosses Posey Pronto, MD shp:ap D: 02/25/2012 08:11:01 ET T: 02/25/2012 13:17:49 ET JOB#: [id number]  cc: Mande Auvil H. Posey Pronto, MD, <Dictator> Jerrell Belfast, MD Alric Seton MD ELECTRONICALLY SIGNED 03/02/2012 11:41

## 2015-03-01 ENCOUNTER — Other Ambulatory Visit: Payer: Self-pay | Admitting: Family Medicine

## 2015-03-01 DIAGNOSIS — R51 Headache: Principal | ICD-10-CM

## 2015-03-01 DIAGNOSIS — R519 Headache, unspecified: Secondary | ICD-10-CM

## 2015-03-12 ENCOUNTER — Other Ambulatory Visit: Payer: Self-pay

## 2015-03-12 ENCOUNTER — Telehealth: Payer: Self-pay | Admitting: Family Medicine

## 2015-03-12 DIAGNOSIS — L4050 Arthropathic psoriasis, unspecified: Secondary | ICD-10-CM | POA: Insufficient documentation

## 2015-03-12 MED ORDER — FENTANYL 50 MCG/HR TD PT72: 50.0000 ug | MEDICATED_PATCH | TRANSDERMAL | Status: DC

## 2015-03-12 MED ORDER — FENTANYL 50 MCG/HR TD PT72
50.0000 ug | MEDICATED_PATCH | TRANSDERMAL | Status: DC
Start: 2015-03-12 — End: 2015-03-12

## 2015-03-12 NOTE — Telephone Encounter (Signed)
Informed pt as below. Keith Carpenter, CMA  

## 2015-03-12 NOTE — Telephone Encounter (Signed)
LMTCB

## 2015-03-12 NOTE — Telephone Encounter (Signed)
Pt would like a four month rx please.   Thanks,   -Mickel Baas

## 2015-03-15 ENCOUNTER — Other Ambulatory Visit: Payer: Self-pay | Admitting: Family Medicine

## 2015-03-15 DIAGNOSIS — L4050 Arthropathic psoriasis, unspecified: Secondary | ICD-10-CM

## 2015-03-28 ENCOUNTER — Other Ambulatory Visit: Payer: Self-pay

## 2015-03-28 DIAGNOSIS — L4050 Arthropathic psoriasis, unspecified: Secondary | ICD-10-CM

## 2015-03-28 MED ORDER — HYDROCODONE-ACETAMINOPHEN 5-325 MG PO TABS: 1.0000 | ORAL_TABLET | Freq: Two times a day (BID) | ORAL | Status: DC | PRN

## 2015-03-28 NOTE — Telephone Encounter (Signed)
Printed.  Please notify patient. Thanks.  

## 2015-04-05 ENCOUNTER — Other Ambulatory Visit: Payer: Self-pay | Admitting: Family Medicine

## 2015-04-05 DIAGNOSIS — E785 Hyperlipidemia, unspecified: Secondary | ICD-10-CM

## 2015-04-17 ENCOUNTER — Telehealth: Payer: Self-pay | Admitting: Family Medicine

## 2015-04-17 ENCOUNTER — Emergency Department
Admission: EM | Admit: 2015-04-17 | Discharge: 2015-04-17 | Disposition: A | Discharge status: Home / Self Care | Payer: BC Managed Care – PPO | Attending: Student | Admitting: Student

## 2015-04-17 DIAGNOSIS — I10 Essential (primary) hypertension: Secondary | ICD-10-CM | POA: Insufficient documentation

## 2015-04-17 DIAGNOSIS — Z79899 Other long term (current) drug therapy: Secondary | ICD-10-CM | POA: Insufficient documentation

## 2015-04-17 DIAGNOSIS — E119 Type 2 diabetes mellitus without complications: Secondary | ICD-10-CM | POA: Insufficient documentation

## 2015-04-17 DIAGNOSIS — Z7982 Long term (current) use of aspirin: Secondary | ICD-10-CM | POA: Insufficient documentation

## 2015-04-17 DIAGNOSIS — G610 Guillain-Barre syndrome: Secondary | ICD-10-CM | POA: Insufficient documentation

## 2015-04-17 DIAGNOSIS — Z23 Encounter for immunization: Secondary | ICD-10-CM | POA: Insufficient documentation

## 2015-04-17 MED ORDER — RABIES IMMUNE GLOBULIN 150 UNIT/ML IM INJ
20.0000 [IU]/kg | INJECTION | Freq: Once | INTRAMUSCULAR | Status: AC
  Administered 2015-04-17: 2100 [IU] via INTRAMUSCULAR
  Filled 2015-04-17: qty 14

## 2015-04-17 MED ORDER — RABIES VACCINE, PCEC IM SUSR
1.0000 mL | Freq: Once | INTRAMUSCULAR | Status: AC
  Administered 2015-04-17: 1 mL via INTRAMUSCULAR
  Filled 2015-04-17: qty 1

## 2015-04-17 NOTE — Telephone Encounter (Signed)
Please advise. Thanks.  

## 2015-04-17 NOTE — Telephone Encounter (Signed)
[facility]  [facility] Recommended rabies series. Notified patient and ER. Thanks.

## 2015-04-17 NOTE — ED Notes (Signed)
Pt sent here by PCP for rabies exposure, pt reports his dog was bit by a raccoon and pt attempted to do CPR on his pet. Vet called PCP and PCP sent here.

## 2015-04-17 NOTE — Telephone Encounter (Signed)
Pt called stating his dog was attacked by a racoon last night and pt states he did try to give his dog CPR.  The vet at [facility] [phone] advised pt to call and let us know.  Pt is asking if he will need to do any rabies testing at this time?  CB#[phone] or [phone]

## 2015-04-17 NOTE — Discharge Instructions (Signed)
As discussed, return to ER in 3, 7, and 14 days for remaining rabies immunizations.   Return to ER immediately for weakness, decreased sensation or tingling, off-balanced gait, fever, new or worsening concerns.   Rabies Vaccine What You Need to Know WHAT IS RABIES?  Rabies is a serious disease. It is caused by a virus.  Rabies is mainly a disease of animals. Humans get rabies when they are bitten by infected animals.  At first there might not be any symptoms. But weeks, or even years after a bite, rabies can cause pain, fatigue, headaches, fever, and irritability. These are followed by seizures, hallucinations, and paralysis. Human rabies is almost always fatal.  Wild animals, especially bats, are the most common source of human rabies infection in the [place]. Skunks, raccoons, dogs, cats, coyotes, foxes, and other mammals can also transmit the disease.  Human rabies is rare in the [place]. There have been only 16 cases diagnosed since 1990. However, between 16,000 and 39,000 people are vaccinated each year as a precaution after animal bites. Also, rabies is far more common in other parts of the world, with about 40,000 to 70,000 rabies-related deaths worldwide each year. Bites from unvaccinated dogs cause most of these cases. Rabies vaccine can prevent rabies. RABIES VACCINE  Rabies vaccine is given to people at high risk of rabies to protect them if they are exposed. It can also prevent the disease if it is given to a person after they have been exposed.  Rabies vaccine is made from killed rabies virus. It cannot cause rabies. WHO SHOULD GET RABIES VACCINE AND WHEN? Preventive Vaccination (No Exposure)  People at high risk of exposure to rabies, such as veterinarians, Insurance account manager, rabies laboratory workers, spelunkers, and rabies biologics production workers should be offered rabies vaccine.  The vaccine should also be considered for:  People whose activities bring  them into frequent contact with rabies virus or with possibly rabid animals.  International travelers who are likely to come in contact with animals in parts of the world where rabies is common.  The pre-exposure schedule for rabies vaccination is 3 doses, given at the following times:  Dose 1: As appropriate.  Dose 2: 7 days after Dose 1.  Dose 3: 21 days or 28 days after Dose 1.  For laboratory workers and others who may be repeatedly exposed to rabies virus, periodic testing for immunity is recommended and booster doses should be given as needed. (Testing or booster doses are not recommended for travelers). Ask your doctor for details. Vaccination After an Exposure Anyone who has been bitten by an animal, or who otherwise may have been exposed to rabies, should clean the wound and see a doctor immediately. The doctor will determine if they need to be vaccinated. A person who is exposed and has never been vaccinated against rabies should get 4 doses of rabies vaccine: one dose right away and additional doses on the 3rd, 7th, and 14th days. They should also get another shot called Rabies Immune Globulin at the same time as the first dose.  A person who has been previously vaccinated should get 2 doses of rabies vaccine: one right away and another on the 3rd day. Rabies Immune Globulin is not needed. TELL YOUR DOCTOR IF: Talk with a doctor before getting rabies vaccine if you:  Ever had a serious (life-threatening) allergic reaction to a previous dose of rabies vaccine or to any component of the vaccine; tell your doctor if you have  any severe allergies.  Have a weakened immune system because of:  HIV, AIDS, or another disease that affects the immune system.  Treatment with drugs that affect the immune system, such as steroids.  Cancer or cancer treatment with radiation or drugs. If you have a minor illness, such as a cold, you can be vaccinated. If you are moderately or severely ill,  you should probably wait until you recover before getting a routine (non-exposure) dose of rabies vaccine. If you have been exposed to rabies virus, you should get the vaccine regardless of any other illnesses you may have. WHAT ARE THE RISKS FROM RABIES VACCINE? A vaccine, like any medicine, is capable of causing serious problems, such as severe allergic reactions. The risk of a vaccine causing serious harm, or death, is extremely small. Serious problems from rabies vaccine are very rare.  Mild problems:  Soreness, redness, swelling, or itching where the shot was given (30% to 74%).  Headache, nausea, abdominal pain, muscle aches, or dizziness (5% to 40%). Moderate problems:  Hives, pain in the joints, or fever (about 6% of booster doses).  Other nervous system disorders, such as Guillain-Barr Syndrome (GBS), have been reported after rabies vaccine, but this happens so rarely that it is not known whether they are related to the vaccine. Note: Several brands of rabies vaccine are available in the [place], and reactions may vary between brands. Your provider can give you more information about a particular brand. WHAT IF THERE IS A SERIOUS REACTION? What should I look for? Look for anything that concerns you, such as signs of a severe allergic reaction, very high fever, or behavior changes.  Signs of a severe allergic reaction can include hives, swelling of the face and throat, difficulty breathing, a fast heartbeat, dizziness, and weakness. These would start a few minutes to a few hours after the vaccination. What should I do?  If you think it is a severe allergic reaction or other emergency that cannot wait, call 911 or get the person to the nearest hospital. Otherwise, call your doctor.  Afterward, the reaction should be reported to the Vaccine Adverse Event Reporting System (VAERS). Your doctor might file this report, or you can do it yourself through the VAERS website at  www.vaers.SamedayNews.es or by calling 934-884-2990. VAERS is only for reporting reactions. They do not give medical advice. HOW CAN I LEARN MORE?  Ask your doctor.  Call your local or state health department.  Contact the Centers for Disease Control and Prevention (CDC):  Visit the CDC rabies website at [place].no CDC Rabies Vaccine VIS (06/19/08) Document Released: 06/28/2006 Document Revised: 08/17/2012 Document Reviewed: 12/21/2012 [facility] Patient Information 2015 [place]. This information is not intended to replace advice given to you by your health care provider. Make sure you discuss any questions you have with your health care provider.

## 2015-04-17 NOTE — ED Notes (Signed)
Pt sent to ER for eval of rabies exposure.  Pt did CPR on his dog last night after it was attacked by a Presenter, broadcasting.   Pt does not have any bites, abrasions.  Pt has hx of Guillane-Barre Syndrome x 2.  Pt states he is unable to take  vaccines and is unsure what to do.

## 2015-04-17 NOTE — ED Notes (Signed)
Rabies injections given.   Tolerated well.

## 2015-04-17 NOTE — ED Provider Notes (Signed)
[facility] Emergency Department Provider Note  ____________________________________________  Time seen: Approximately 5:07 PM  I have reviewed the triage vital signs and the nursing notes.   HISTORY  Chief Complaint Rabies Injection   HPI Keith Carpenter is a 59 y.o. male presents the ER for potential rabies exposure. Patient reports that last night at approximately 9 PM his dog was attacked unprovoked by a raccoon. Patient reports that there were multiple bites between the raccoon and the dog back and forth and states that the dog sustained a fatal bite and tear to the neck. Patient states that the raccoon then ran away and he and his neighbor was unable to kill the raccoon. Patient states that he then proceeded to do CPR on his dog with mouth-to-mouth. Patient reports that there was blood as well as saliva in the patient in the dog's mouth (both from the dog and the racoon) and then he had blood in saliva at his mouth post CPR.   Patient reports that he was seen by his primary care physician this morning for the same and referred to the ER for rabies immunizations.  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 Patient reports that Guillain-Barr was not induced by immunizations. Patient reports that first episode of Guillain-Barr syndrome was induced due to Remicade and the second was due to general anesthesia. Patient repeated reports that these instances occurred in 2002 and 2012. Reports that he has had no further complications and no longer follows with neurology.   Patient denies any break in skin, cut or bite to himself. Denies recent sickness or other complaints. Denies fever.   Past Medical History  Diagnosis Date  . Schamberg disease   . Paresthesia of foot   . Nausea   . Hypocalcemia   . H/O Guillain-Barre syndrome   . Dysthymic disorder   . Psoriatic arthritis   . Hyperlipidemia   . Hypothyroidism   . Gastric  ulcer   . Esophageal reflux   . Genital disorder, male   . Insomnia   . Panic disorder   . Colonic constipation   . Vitamin D deficiency   . Syncope and collapse   . Diabetes mellitus without complication   . Thoracic aortic aneurysm     4.4 cm followed at [facility]  . Essential hypertension     Patient Active Problem List   Diagnosis Date Noted  . Psoriatic arthritis 03/12/2015  . Chest pain 06/21/2014  . Angina, class III 06/04/2014  . Thoracic aortic aneurysm   . Essential hypertension   . RESTLESS LEGS SYNDROME 08/31/2007  . Type II or unspecified type diabetes mellitus without mention of complication, not stated as uncontrolled 08/30/2007  . Hyperlipemia 08/30/2007  . PERSISTENT DISORDER INITIATING/MAINTAINING SLEEP 08/30/2007  . 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 HYPERTENSION 08/30/2007  . ARTHRITIS 08/30/2007  . Headache 08/30/2007    Past Surgical History  Procedure Laterality Date  . Thyroidectomy    . Meniscus repair Right   . Cardiac catheterization  06/07/2014    [facility]    Current Outpatient Rx  Name  Route  Sig  Dispense  Refill  . amLODipine (NORVASC) 5 MG tablet   Oral   Take 5 mg by mouth daily.         Marland Kitchen aspirin EC 81 MG tablet   Oral   Take 1 tablet (81 mg total) by mouth daily.   90 tablet   3   . atorvastatin (LIPITOR)  10 MG tablet      TAKE ONE (1) TABLET EACH DAY   90 tablet   3   . DULoxetine (CYMBALTA) 60 MG capsule   Oral   Take 60 mg by mouth daily.         . fentaNYL (DURAGESIC - DOSED MCG/HR) 50 MCG/HR   Transdermal   Place 1 patch (50 mcg total) onto the skin every other day.   15 patch   0     To be filled after 05/12/2015   . gabapentin (NEURONTIN) 600 MG tablet      TAKE ONE TABLET BY MOUTH EVERY MORNING, TAKE ONE TABLET BY MOUTH MIDDAY, TAKE TWO TABLETS BY MOUTH AT BEDTIME.   120 tablet   5   . HYDROcodone-acetaminophen (NORCO/VICODIN) 5-325 MG per tablet   Oral   Take 1 tablet by mouth 2 (two) times daily as  needed for moderate pain.   60 tablet   0   . levothyroxine (SYNTHROID, LEVOTHROID) 137 MCG tablet   Oral   Take 137 mcg by mouth daily before breakfast.         . lisinopril (PRINIVIL,ZESTRIL) 10 MG tablet   Oral   Take 10 mg by mouth daily.         . metoprolol (LOPRESSOR) 50 MG tablet   Oral   Take 50 mg by mouth 2 (two) times daily.         . mirtazapine (REMERON) 30 MG tablet   Oral   Take 30 mg by mouth at bedtime.         . nabumetone (RELAFEN) 500 MG tablet   Oral   Take 500 mg by mouth 2 (two) times daily as needed.         . nabumetone (RELAFEN) 500 MG tablet      TAKE 1 TABLET BY MOUTH TWICE A DAY AS NEEDED   60 tablet   5     PT NEEDS REFILLS   . zolpidem (AMBIEN) 10 MG tablet   Oral   Take 10 mg by mouth at bedtime.           Allergies Declomycin; Demeclocycline hcl; Ginger; and Remicade  Family History  Problem Relation Age of Onset  . Hyperlipidemia Mother   . Heart attack Father 76  . Hypertension Father   . Hyperlipidemia Father   . Heart failure Father     Social History History  Substance Use Topics  . Smoking status: Never Smoker   . Smokeless tobacco: Not on file  . Alcohol Use: No    Review of Systems Constitutional: No fever/chills Eyes: No visual changes. ENT: No sore throat. Cardiovascular: Denies chest pain. Respiratory: Denies shortness of breath. Gastrointestinal: No abdominal pain.  No nausea, no vomiting.  No diarrhea.  No constipation. Genitourinary: Negative for dysuria. Musculoskeletal: Negative for back pain. Skin: Negative for rash. Neurological: Negative for headaches, focal weakness or numbness.  10-point ROS otherwise negative.  ____________________________________________   PHYSICAL EXAM:  VITAL SIGNS: ED Triage Vitals  Enc Vitals Group     BP 04/17/15 1555 191/84 mmHg     Pulse Rate 04/17/15 1555 80     Resp 04/17/15 1555 16     Temp 04/17/15 1555 98.1 F (36.7 C)     Temp Source  04/17/15 1555 Oral     SpO2 04/17/15 1555 98 %     Weight 04/17/15 1555 228 lb (103.42 kg)     Height 04/17/15 1555 5\' 11"  (  1.803 m)     Head Cir --      Peak Flow --      Pain Score 04/17/15 1554 0     Pain Loc --      Pain Edu? --      Excl. in GC? --   Blood pressure 145/95, pulse 66, temperature 98.5 F (36.9 C), temperature source Oral, resp. rate 18, height 5\' 11"  (1.803 m), weight 228 lb (103.42 kg), SpO2 98 %.  Constitutional: Alert and oriented. Well appearing and in no acute distress. Eyes: Conjunctivae are normal. PERRL. EOMI. Head: Atraumatic.  Nose: No congestion/rhinnorhea.  Mouth/Throat: Mucous membranes are moist.  Oropharynx non-erythematous. Neck: No stridor.  No cervical spine tenderness to palpation. Hematological/Lymphatic/Immunilogical: No cervical lymphadenopathy. Cardiovascular: Normal rate, regular rhythm. Grossly normal heart sounds.  Good peripheral circulation. Respiratory: Normal respiratory effort.  No retractions. Lungs CTAB. Gastrointestinal: Soft and nontender. No distention. Normal Bowel sounds.  No abdominal bruits. No CVA tenderness. Musculoskeletal: No lower or upper extremity tenderness nor edema.  No joint effusions.  Neurologic:  Normal speech and language. No gross focal neurologic deficits are appreciated. No gait instability. Skin:  Skin is warm, dry and intact. No rash noted. Psychiatric: Mood and affect are normal. Speech and behavior are normal.  ____________________________________________   LABS (all labs ordered are listed, but only abnormal results are displayed)  Labs Reviewed - No data to display ____________________________________________  INITIAL IMPRESSION / ASSESSMENT AND PLAN / ED COURSE  Pertinent labs & imaging results that were available during my care of the patient were reviewed by me and considered in my medical decision making (see chart for details).  Very well-appearing patient. No acute distress. Presents the  ER for rabies immunization post potential rabies exposure. Due to reported exposure patient at high risk for rabies as patient's dog was attacked in an unprovoked attack by a raccoon. Patient then proceeded to do mouth-to-mouth during CPR on his dog with blood and saliva in dog's mouth and then in patient's mouth. Due to patient at high risk for rabies Will go ahead and proceed with rabies immunization at this time.  1700: discussed in detail with patient regarding immunizations and risks. Patient verbalized that he understands the risk as well as he and his primary physician Dr. Venia Minks also had a long discussion regarding need for rabies immunizations. Patient states that he wants to proceed with rabies immunizations due to risk of rabies over [facility].   Also discussed patient and plan with Dr Edd Fabian.    1700: Discussed in detail with Dr. Ola Spurr infectious disease physician regarding exposure as well as patient history. Due to not having raccoon available for testing and high risk exposure, recommends to proceed and initiate rabies immunization including vaccine and immunoglobulin. Recommends immunizations on days 0, 3, 7 and 14.   Also discussed with Dr Irish Elders neurology who also recommends to proceed with rabies immunization as Guillain Barre treatable and rabies not. Neurology does not recommend any further monitoring in ER post immunizations.   Well patient. No acute distress. High risk rabies exposure last night. History of Jossie Ng, per patient non immunization induced. Discussed very strict follow up and return parameters.Patient to return to ER immediately for weakness, gait changes, numbness/tingling or other concerns or changes. Patient verbalized and understanding and agreed to plan.  ____________________________________________   FINAL CLINICAL IMPRESSION(S) / ED DIAGNOSES  Final diagnoses:  Need for prophylactic vaccination against rabies       Marylene Land,  NP 04/17/15 1819  Marylene Land, NP 04/17/15 1911  Joanne Gavel, MD 04/18/15 [phone]

## 2015-04-17 NOTE — Telephone Encounter (Signed)
Patient did go to ER and started his rabies series. Please call and check on patient. Thanks.

## 2015-04-18 NOTE — Telephone Encounter (Signed)
Talked to pt regarding the above information, pt stated that he feels " pretty good".  Pt was advised to call if any questions or concerns.

## 2015-04-20 ENCOUNTER — Emergency Department
Admission: EM | Admit: 2015-04-20 | Discharge: 2015-04-20 | Disposition: A | Discharge status: Home / Self Care | Payer: BC Managed Care – PPO | Attending: Emergency Medicine | Admitting: Emergency Medicine

## 2015-04-20 ENCOUNTER — Encounter: Payer: Self-pay | Admitting: Emergency Medicine

## 2015-04-20 DIAGNOSIS — I10 Essential (primary) hypertension: Secondary | ICD-10-CM | POA: Diagnosis not present

## 2015-04-20 DIAGNOSIS — Z203 Contact with and (suspected) exposure to rabies: Secondary | ICD-10-CM | POA: Diagnosis not present

## 2015-04-20 DIAGNOSIS — Z79899 Other long term (current) drug therapy: Secondary | ICD-10-CM | POA: Diagnosis not present

## 2015-04-20 DIAGNOSIS — Z7982 Long term (current) use of aspirin: Secondary | ICD-10-CM | POA: Diagnosis not present

## 2015-04-20 DIAGNOSIS — Z23 Encounter for immunization: Secondary | ICD-10-CM | POA: Diagnosis present

## 2015-04-20 DIAGNOSIS — E119 Type 2 diabetes mellitus without complications: Secondary | ICD-10-CM | POA: Diagnosis not present

## 2015-04-20 MED ORDER — RABIES VACCINE, PCEC IM SUSR
1.0000 mL | Freq: Once | INTRAMUSCULAR | Status: AC
  Administered 2015-04-20: 1 mL via INTRAMUSCULAR
  Filled 2015-04-20: qty 1

## 2015-04-20 NOTE — ED Notes (Signed)
Patient here for 2nd rabies injection.

## 2015-04-20 NOTE — Discharge Instructions (Signed)
Rabies Vaccine What You Need to Know WHAT IS RABIES?  Rabies is a serious disease. It is caused by a virus.  Rabies is mainly a disease of animals. Humans get rabies when they are bitten by infected animals.  At first there might not be any symptoms. But weeks, or even years after a bite, rabies can cause pain, fatigue, headaches, fever, and irritability. These are followed by seizures, hallucinations, and paralysis. Human rabies is almost always fatal.  Wild animals, especially bats, are the most common source of human rabies infection in the [place]. Skunks, raccoons, dogs, cats, coyotes, foxes, and other mammals can also transmit the disease.  Human rabies is rare in the [place]. There have been only 36 cases diagnosed since 1990. However, between 16,000 and 39,000 people are vaccinated each year as a precaution after animal bites. Also, rabies is far more common in other parts of the world, with about 40,000 to 70,000 rabies-related deaths worldwide each year. Bites from unvaccinated dogs cause most of these cases. Rabies vaccine can prevent rabies. RABIES VACCINE  Rabies vaccine is given to people at high risk of rabies to protect them if they are exposed. It can also prevent the disease if it is given to a person after they have been exposed.  Rabies vaccine is made from killed rabies virus. It cannot cause rabies. WHO SHOULD GET RABIES VACCINE AND WHEN? Preventive Vaccination (No Exposure)  People at high risk of exposure to rabies, such as veterinarians, Insurance account manager, rabies laboratory workers, spelunkers, and rabies biologics production workers should be offered rabies vaccine.  The vaccine should also be considered for:  People whose activities bring them into frequent contact with rabies virus or with possibly rabid animals.  International travelers who are likely to come in contact with animals in parts of the world where rabies is common.  The pre-exposure  schedule for rabies vaccination is 3 doses, given at the following times:  Dose 1: As appropriate.  Dose 2: 7 days after Dose 1.  Dose 3: 21 days or 28 days after Dose 1.  For laboratory workers and others who may be repeatedly exposed to rabies virus, periodic testing for immunity is recommended and booster doses should be given as needed. (Testing or booster doses are not recommended for travelers). Ask your doctor for details. Vaccination After an Exposure Anyone who has been bitten by an animal, or who otherwise may have been exposed to rabies, should clean the wound and see a doctor immediately. The doctor will determine if they need to be vaccinated. A person who is exposed and has never been vaccinated against rabies should get 4 doses of rabies vaccine: one dose right away and additional doses on the 3rd, 7th, and 14th days. They should also get another shot called Rabies Immune Globulin at the same time as the first dose.  A person who has been previously vaccinated should get 2 doses of rabies vaccine: one right away and another on the 3rd day. Rabies Immune Globulin is not needed. TELL YOUR DOCTOR IF: Talk with a doctor before getting rabies vaccine if you:  Ever had a serious (life-threatening) allergic reaction to a previous dose of rabies vaccine or to any component of the vaccine; tell your doctor if you have any severe allergies.  Have a weakened immune system because of:  HIV, AIDS, or another disease that affects the immune system.  Treatment with drugs that affect the immune system, such as steroids.  Cancer  or cancer treatment with radiation or drugs. If you have a minor illness, such as a cold, you can be vaccinated. If you are moderately or severely ill, you should probably wait until you recover before getting a routine (non-exposure) dose of rabies vaccine. If you have been exposed to rabies virus, you should get the vaccine regardless of any other illnesses you may  have. WHAT ARE THE RISKS FROM RABIES VACCINE? A vaccine, like any medicine, is capable of causing serious problems, such as severe allergic reactions. The risk of a vaccine causing serious harm, or death, is extremely small. Serious problems from rabies vaccine are very rare.  Mild problems:  Soreness, redness, swelling, or itching where the shot was given (30% to 74%).  Headache, nausea, abdominal pain, muscle aches, or dizziness (5% to 40%). Moderate problems:  Hives, pain in the joints, or fever (about 6% of booster doses).  Other nervous system disorders, such as Guillain-Barr Syndrome (GBS), have been reported after rabies vaccine, but this happens so rarely that it is not known whether they are related to the vaccine. Note: Several brands of rabies vaccine are available in the [place], and reactions may vary between brands. Your provider can give you more information about a particular brand. WHAT IF THERE IS A SERIOUS REACTION? What should I look for? Look for anything that concerns you, such as signs of a severe allergic reaction, very high fever, or behavior changes.  Signs of a severe allergic reaction can include hives, swelling of the face and throat, difficulty breathing, a fast heartbeat, dizziness, and weakness. These would start a few minutes to a few hours after the vaccination. What should I do?  If you think it is a severe allergic reaction or other emergency that cannot wait, call 911 or get the person to the nearest hospital. Otherwise, call your doctor.  Afterward, the reaction should be reported to the Vaccine Adverse Event Reporting System (VAERS). Your doctor might file this report, or you can do it yourself through the VAERS website at www.vaers.SamedayNews.es or by calling 252-181-1355. VAERS is only for reporting reactions. They do not give medical advice. HOW CAN I LEARN MORE?  Ask your doctor.  Call your local or state health department.  Contact the  Centers for Disease Control and Prevention (CDC):  Visit the CDC rabies website at [place].no CDC Rabies Vaccine VIS (06/19/08) Document Released: 06/28/2006 Document Revised: 08/17/2012 Document Reviewed: 12/21/2012 [facility] Patient Information 2015 [place]. This information is not intended to replace advice given to you by your health care provider. Make sure you discuss any questions you have with your health care provider.  Return as scheduled for vaccine 3 of 4.

## 2015-04-20 NOTE — ED Provider Notes (Signed)
[facility] Emergency Department Provider Note ____________________________________________  Time seen: 1013  I have reviewed the triage vital signs and the nursing notes.  HISTORY  Chief Complaint  Rabies Injection  HPI Keith Carpenter is a 59 y.o. male who reports that 04/16/15 his dog was attacked, unprovoked by a raccoon. Patient reports that there were multiple bites between the raccoon and the dog back and forth and states that the dog sustained a fatal bite and tear to the neck. Patient states that he then proceeded to do CPR on his dog, including mouth-to-mouth. He was begun on the rabies exposure protocol due to blood & saliva contact with his mucous membranes. Of concern, was his history of Guillian-Barre x 2 following Remicade infusion and general anesthesia, respectively. He reports today for rabies vaccine # 2 of 4. He is without complaints from the previous injection, including muscle weakness or paresthesias. He reports only local injection site discomfort.   Past Medical History  Diagnosis Date  . Schamberg disease   . Paresthesia of foot   . Nausea   . Hypocalcemia   . H/O Guillain-Barre syndrome   . Dysthymic disorder   . Psoriatic arthritis   . Hyperlipidemia   . Hypothyroidism   . Gastric ulcer   . Esophageal reflux   . Genital disorder, male   . Insomnia   . Panic disorder   . Colonic constipation   . Vitamin D deficiency   . Syncope and collapse   . Diabetes mellitus without complication   . Thoracic aortic aneurysm     4.4 cm [facility]  . Essential hypertension     Patient Active Problem List   Diagnosis Date Noted  . Psoriatic arthritis 03/12/2015  . Chest pain 06/21/2014  . Angina, class III 06/04/2014  . Thoracic aortic aneurysm   . Essential hypertension   . RESTLESS LEGS SYNDROME 08/31/2007  . Type II or unspecified type diabetes mellitus without mention of complication, not stated as uncontrolled 08/30/2007   . Hyperlipemia 08/30/2007  . PERSISTENT DISORDER INITIATING/MAINTAINING SLEEP 08/30/2007  . 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 HYPERTENSION 08/30/2007  . ARTHRITIS 08/30/2007  . Headache 08/30/2007    Past Surgical History  Procedure Laterality Date  . Thyroidectomy    . Meniscus repair Right   . Cardiac catheterization  06/07/2014    [facility]    Current Outpatient Rx  Name  Route  Sig  Dispense  Refill  . amLODipine (NORVASC) 5 MG tablet   Oral   Take 5 mg by mouth daily.         Marland Kitchen aspirin EC 81 MG tablet   Oral   Take 1 tablet (81 mg total) by mouth daily.   90 tablet   3   . atorvastatin (LIPITOR) 10 MG tablet      TAKE ONE (1) TABLET EACH DAY   90 tablet   3   . DULoxetine (CYMBALTA) 60 MG capsule   Oral   Take 60 mg by mouth daily.         . fentaNYL (DURAGESIC - DOSED MCG/HR) 50 MCG/HR   Transdermal   Place 1 patch (50 mcg total) onto the skin every other day.   15 patch   0     To be filled after 05/12/2015   . gabapentin (NEURONTIN) 600 MG tablet      TAKE ONE TABLET BY MOUTH EVERY MORNING, TAKE ONE TABLET BY MOUTH MIDDAY, TAKE TWO TABLETS BY MOUTH AT  BEDTIME.   120 tablet   5   . HYDROcodone-acetaminophen (NORCO/VICODIN) 5-325 MG per tablet   Oral   Take 1 tablet by mouth 2 (two) times daily as needed for moderate pain.   60 tablet   0   . levothyroxine (SYNTHROID, LEVOTHROID) 137 MCG tablet   Oral   Take 137 mcg by mouth daily before breakfast.         . lisinopril (PRINIVIL,ZESTRIL) 10 MG tablet   Oral   Take 10 mg by mouth daily.         . metoprolol (LOPRESSOR) 50 MG tablet   Oral   Take 50 mg by mouth 2 (two) times daily.         . mirtazapine (REMERON) 30 MG tablet   Oral   Take 30 mg by mouth at bedtime.         . nabumetone (RELAFEN) 500 MG tablet   Oral   Take 500 mg by mouth 2 (two) times daily as needed.         . nabumetone (RELAFEN) 500 MG tablet      TAKE 1 TABLET BY MOUTH TWICE A DAY AS NEEDED   60  tablet   5     PT NEEDS REFILLS   . zolpidem (AMBIEN) 10 MG tablet   Oral   Take 10 mg by mouth at bedtime.          Allergies Declomycin; Demeclocycline hcl; Ginger; and Remicade  Family History  Problem Relation Age of Onset  . Hyperlipidemia Mother   . Heart attack Father 18  . Hypertension Father   . Hyperlipidemia Father   . Heart failure Father     Social History History  Substance Use Topics  . Smoking status: Never Smoker   . Smokeless tobacco: Not on file  . Alcohol Use: No   Review of Systems  Constitutional: Negative for fever. Eyes: Negative for visual changes. ENT: Negative for sore throat. Cardiovascular: Negative for chest pain. Respiratory: Negative for shortness of breath. Gastrointestinal: Negative for abdominal pain, vomiting and diarrhea. Genitourinary: Negative for dysuria. Musculoskeletal: Negative for back pain. Skin: Negative for rash. Neurological: Negative for headaches, focal weakness or numbness. ____________________________________________  PHYSICAL EXAM:  VITAL SIGNS: ED Triage Vitals  Enc Vitals Group     BP 04/20/15 1004 135/76 mmHg     Pulse Rate 04/20/15 1004 66     Resp 04/20/15 1004 18     Temp 04/20/15 1004 98.4 F (36.9 C)     Temp Source 04/20/15 1004 Oral     SpO2 04/20/15 1004 99 %     Weight --      Height --      Head Cir --      Peak Flow --      Pain Score --      Pain Loc --      Pain Edu? --      Excl. in [place]? --    Constitutional: Alert and oriented. Well appearing and in no distress. Eyes: Conjunctivae are normal. PERRL. Normal extraocular movements. ENT   Head: Normocephalic and atraumatic.   Nose: No congestion/rhinnorhea.   Mouth/Throat: Mucous membranes are moist.   Neck: Supple. No thyromegaly. Hematological/Lymphatic/Immunilogical: No cervical lymphadenopathy. Cardiovascular: Normal rate, regular rhythm.  Respiratory: Normal respiratory effort. No  wheezes/rales/rhonchi. Gastrointestinal: Soft and nontender. No distention. Musculoskeletal: Nontender with normal range of motion in all extremities.  Neurologic:  Normal gait without ataxia. Normal speech and language.  No gross focal neurologic deficits are appreciated. Skin:  Skin is warm, dry and intact. No rash noted. Psychiatric: Mood and affect are normal. Patient exhibits appropriate insight and judgment. ____________________________________________  PROCEDURES  Rabavert 1 ml IM ____________________________________________  INITIAL IMPRESSION / ASSESSMENT AND PLAN / ED COURSE  Rabies vaccine # 2 of 4. Return on Wednesday (Day 7) for #3 of 4.  ____________________________________________  FINAL CLINICAL IMPRESSION(S) / ED DIAGNOSES  Final diagnoses:  Rabies exposure     Melvenia Needles, PA-C 04/20/15 1034  Earleen Newport, MD 04/20/15 [phone]

## 2015-04-20 NOTE — ED Notes (Signed)
Here for 2nd rabies vaccine. Denies any complaints

## 2015-04-24 ENCOUNTER — Emergency Department
Admission: EM | Admit: 2015-04-24 | Discharge: 2015-04-24 | Disposition: A | Discharge status: Home / Self Care | Payer: BC Managed Care – PPO | Attending: Emergency Medicine | Admitting: Emergency Medicine

## 2015-04-24 ENCOUNTER — Encounter: Payer: Self-pay | Admitting: Emergency Medicine

## 2015-04-24 DIAGNOSIS — I10 Essential (primary) hypertension: Secondary | ICD-10-CM | POA: Insufficient documentation

## 2015-04-24 DIAGNOSIS — Z79899 Other long term (current) drug therapy: Secondary | ICD-10-CM | POA: Insufficient documentation

## 2015-04-24 DIAGNOSIS — E039 Hypothyroidism, unspecified: Secondary | ICD-10-CM | POA: Diagnosis not present

## 2015-04-24 DIAGNOSIS — E119 Type 2 diabetes mellitus without complications: Secondary | ICD-10-CM | POA: Diagnosis not present

## 2015-04-24 DIAGNOSIS — E876 Hypokalemia: Secondary | ICD-10-CM | POA: Diagnosis not present

## 2015-04-24 DIAGNOSIS — Z23 Encounter for immunization: Secondary | ICD-10-CM | POA: Diagnosis present

## 2015-04-24 DIAGNOSIS — F410 Panic disorder [episodic paroxysmal anxiety] without agoraphobia: Secondary | ICD-10-CM | POA: Diagnosis not present

## 2015-04-24 DIAGNOSIS — Z7982 Long term (current) use of aspirin: Secondary | ICD-10-CM | POA: Diagnosis not present

## 2015-04-24 DIAGNOSIS — E785 Hyperlipidemia, unspecified: Secondary | ICD-10-CM | POA: Diagnosis not present

## 2015-04-24 MED ORDER — RABIES VACCINE, PCEC IM SUSR
1.0000 mL | Freq: Once | INTRAMUSCULAR | Status: AC
  Administered 2015-04-24: 1 mL via INTRAMUSCULAR
  Filled 2015-04-24: qty 1

## 2015-04-24 NOTE — ED Provider Notes (Signed)
[facility] Emergency Department Provider Note  ____________________________________________  Time seen: Approximately 12:25 PM  I have reviewed the triage vital signs and the nursing notes.   HISTORY  Chief Complaint Rabies Injection    HPI Keith Carpenter is a 59 y.o. male patient here today for his third series of rabies shots. Patient status post exposure to blood and fluids are trying to do CPR when his dog which was attacked by a raccoon. Patient is being monitored secondary to his history Guillian-Barre file and Remicade infusion. Patient has tolerated the rabies vaccine and prophylactic without any adverse reactions.  Past Medical History  Diagnosis Date  . Schamberg disease   . Paresthesia of foot   . Nausea   . Hypocalcemia   . H/O Guillain-Barre syndrome   . Dysthymic disorder   . Psoriatic arthritis   . Hyperlipidemia   . Hypothyroidism   . Gastric ulcer   . Esophageal reflux   . Genital disorder, male   . Insomnia   . Panic disorder   . Colonic constipation   . Vitamin D deficiency   . Syncope and collapse   . Diabetes mellitus without complication   . Thoracic aortic aneurysm     4.4 cm followed at [facility]  . Essential hypertension     Patient Active Problem List   Diagnosis Date Noted  . Psoriatic arthritis 03/12/2015  . Chest pain 06/21/2014  . Angina, class III 06/04/2014  . Thoracic aortic aneurysm   . Essential hypertension   . RESTLESS LEGS SYNDROME 08/31/2007  . Type II or unspecified type diabetes mellitus without mention of complication, not stated as uncontrolled 08/30/2007  . Hyperlipemia 08/30/2007  . PERSISTENT DISORDER INITIATING/MAINTAINING SLEEP 08/30/2007  . 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 HYPERTENSION 08/30/2007  . ARTHRITIS 08/30/2007  . Headache 08/30/2007    Past Surgical History  Procedure Laterality Date  . Thyroidectomy    . Meniscus repair Right   . Cardiac catheterization  06/07/2014     [facility]    Current Outpatient Rx  Name  Route  Sig  Dispense  Refill  . amLODipine (NORVASC) 5 MG tablet   Oral   Take 5 mg by mouth daily.         Marland Kitchen aspirin EC 81 MG tablet   Oral   Take 1 tablet (81 mg total) by mouth daily.   90 tablet   3   . atorvastatin (LIPITOR) 10 MG tablet      TAKE ONE (1) TABLET EACH DAY   90 tablet   3   . DULoxetine (CYMBALTA) 60 MG capsule   Oral   Take 60 mg by mouth daily.         . fentaNYL (DURAGESIC - DOSED MCG/HR) 50 MCG/HR   Transdermal   Place 1 patch (50 mcg total) onto the skin every other day.   15 patch   0     To be filled after 05/12/2015   . gabapentin (NEURONTIN) 600 MG tablet      TAKE ONE TABLET BY MOUTH EVERY MORNING, TAKE ONE TABLET BY MOUTH MIDDAY, TAKE TWO TABLETS BY MOUTH AT BEDTIME.   120 tablet   5   . HYDROcodone-acetaminophen (NORCO/VICODIN) 5-325 MG per tablet   Oral   Take 1 tablet by mouth 2 (two) times daily as needed for moderate pain.   60 tablet   0   . levothyroxine (SYNTHROID, LEVOTHROID) 137 MCG tablet   Oral   Take 137  mcg by mouth daily before breakfast.         . lisinopril (PRINIVIL,ZESTRIL) 10 MG tablet   Oral   Take 10 mg by mouth daily.         . metoprolol (LOPRESSOR) 50 MG tablet   Oral   Take 50 mg by mouth 2 (two) times daily.         . mirtazapine (REMERON) 30 MG tablet   Oral   Take 30 mg by mouth at bedtime.         . nabumetone (RELAFEN) 500 MG tablet   Oral   Take 500 mg by mouth 2 (two) times daily as needed.         . nabumetone (RELAFEN) 500 MG tablet      TAKE 1 TABLET BY MOUTH TWICE A DAY AS NEEDED   60 tablet   5     PT NEEDS REFILLS   . zolpidem (AMBIEN) 10 MG tablet   Oral   Take 10 mg by mouth at bedtime.           Allergies Declomycin; Demeclocycline hcl; Ginger; and Remicade  Family History  Problem Relation Age of Onset  . Hyperlipidemia Mother   . Heart attack Father 77  . Hypertension Father   . Hyperlipidemia Father    . Heart failure Father     Social History Social History  Substance Use Topics  . Smoking status: Never Smoker   . Smokeless tobacco: None  . Alcohol Use: No    Review of Systems Constitutional: No fever/chills Eyes: No visual changes. ENT: No sore throat. Cardiovascular: Denies chest pain. Respiratory: Denies shortness of breath. Gastrointestinal: No abdominal pain.  No nausea, no vomiting.  No diarrhea.  No constipation. Genitourinary: Negative for dysuria. Musculoskeletal: Negative for back pain. Skin: Negative for rash. Neurological: Negative for headaches, focal weakness or numbness. Psychiatric:Panic disorder Endocrine:Hypokalemia, hyperlipidemia, hypothyroidism, hypertension, and diabetes. Allergic/Immunilogical: The medication list  10-point ROS otherwise negative.  ____________________________________________   PHYSICAL EXAM:  VITAL SIGNS: ED Triage Vitals  Enc Vitals Group     BP 04/24/15 1140 135/89 mmHg     Pulse Rate 04/24/15 1140 82     Resp 04/24/15 1140 18     Temp 04/24/15 1140 98.5 F (36.9 C)     Temp Source 04/24/15 1140 Oral     SpO2 04/24/15 1140 99 %     Weight --      Height --      Head Cir --      Peak Flow --      Pain Score 04/24/15 1140 0     Pain Loc --      Pain Edu? --      Excl. in [place]? --     Constitutional: Alert and oriented. Well appearing and in no acute distress. Eyes: Conjunctivae are normal. PERRL. EOMI. Head: Atraumatic. Nose: No congestion/rhinnorhea. Mouth/Throat: Mucous membranes are moist.  Oropharynx non-erythematous. Neck: No stridor. No cervical spine tenderness to palpation. Hematological/Lymphatic/Immunilogical: No cervical lymphadenopathy. Cardiovascular: Normal rate, regular rhythm. Grossly normal heart sounds.  Good peripheral circulation. Respiratory: Normal respiratory effort.  No retractions. Lungs CTAB. Gastrointestinal: Soft and nontender. No distention. No abdominal bruits. No CVA  tenderness. Musculoskeletal: No lower extremity tenderness nor edema.  No joint effusions. Neurologic:  Normal speech and language. No gross focal neurologic deficits are appreciated. No gait instability. Skin:  Skin is warm, dry and intact. No rash noted. Psychiatric: Mood and affect are normal. Speech  and behavior are normal.  ____________________________________________   LABS (all labs ordered are listed, but only abnormal results are displayed)  Labs Reviewed - No data to display ____________________________________________  EKG   ____________________________________________  RADIOLOGY   ____________________________________________   PROCEDURES  Procedure(s) performed: None  Critical Care performed: No  ____________________________________________   INITIAL IMPRESSION / ASSESSMENT AND PLAN / ED COURSE  Pertinent labs & imaging results that were available during my care of the patient were reviewed by me and considered in my medical decision making (see chart for details).  Rabies vaccine #3. Advised to continue follow-up for rabies completion series ____________________________________________   FINAL CLINICAL IMPRESSION(S) / ED DIAGNOSES  Final diagnoses:  Need for prophylactic vaccination against rabies      Sable Feil, PA-C 04/24/15 1234  Lisa Roca, MD 04/24/15 [phone]

## 2015-04-24 NOTE — ED Notes (Signed)
Here for 3rd rabies vaccine.

## 2015-05-01 ENCOUNTER — Emergency Department
Admission: EM | Admit: 2015-05-01 | Discharge: 2015-05-01 | Disposition: A | Discharge status: Home / Self Care | Payer: BC Managed Care – PPO | Attending: Emergency Medicine | Admitting: Emergency Medicine

## 2015-05-01 DIAGNOSIS — Z7982 Long term (current) use of aspirin: Secondary | ICD-10-CM | POA: Insufficient documentation

## 2015-05-01 DIAGNOSIS — E119 Type 2 diabetes mellitus without complications: Secondary | ICD-10-CM | POA: Insufficient documentation

## 2015-05-01 DIAGNOSIS — Z23 Encounter for immunization: Secondary | ICD-10-CM | POA: Diagnosis not present

## 2015-05-01 DIAGNOSIS — Z79899 Other long term (current) drug therapy: Secondary | ICD-10-CM | POA: Insufficient documentation

## 2015-05-01 DIAGNOSIS — I10 Essential (primary) hypertension: Secondary | ICD-10-CM | POA: Insufficient documentation

## 2015-05-01 MED ORDER — RABIES VACCINE, PCEC IM SUSR
1.0000 mL | Freq: Once | INTRAMUSCULAR | Status: AC
  Administered 2015-05-01: 1 mL via INTRAMUSCULAR
  Filled 2015-05-01: qty 1

## 2015-05-01 NOTE — ED Notes (Signed)
Patient here for rabies vaccine.

## 2015-05-01 NOTE — ED Provider Notes (Signed)
[facility] Emergency Department Provider Note  ____________________________________________  Time seen: Approximately 11:55 AM  I have reviewed the triage vital signs and the nursing notes.   HISTORY  Chief Complaint Rabies Injection  HPI Keith Carpenter is a 59 y.o. male presents to the ER for the complaint of rabies immunization. Patient reports that he is here for the fourth of 4 rabies immunizations. Patient was seen here on 04/17/2015 and at that time rabies immunizations was initiated as the night before his dog was attacked by a raccoon unprovoked. His his dog was fatally injured and he proceeded to give CPR to his dog with mouth-to-mouth on. At that time patient had saliva as well as blood in his mouth from the dog. Patient reports that the dog had bitten the raccoon as well several times and the concern was that the blood in saliva may have been from the raccoon as well. Patient was receiving rabies immunizations post high risk rabies exposure.   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 Patient reports that first episode of Guillain-Barr syndrome was induced due to Remicade and the second was due to general anesthesia. Patient repeated reports that these instances occurred in 2002 and 2012. Reports that he has had no further complications.   Patient reports that he has been tolerating immunizations well. Denies any complaints. Denies any fever, weakness, urinary or bowel changes, chest pain or shortness of breath. Patient states that he has been tolerating very well and wants to continue with the fourth immunization.  Reports immunocompetent. Reports normal immune system.   Past Medical History  Diagnosis Date  . Schamberg disease   . Paresthesia of foot   . Nausea   . Hypocalcemia   . H/O Guillain-Barre syndrome   . Dysthymic disorder   . Psoriatic arthritis   . Hyperlipidemia   . Hypothyroidism   . Gastric ulcer    . Esophageal reflux   . Genital disorder, male   . Insomnia   . Panic disorder   . Colonic constipation   . Vitamin D deficiency   . Syncope and collapse   . Diabetes mellitus without complication   . Thoracic aortic aneurysm     4.4 cm followed at [facility]  . Essential hypertension     Patient Active Problem List   Diagnosis Date Noted  . Psoriatic arthritis 03/12/2015  . Chest pain 06/21/2014  . Angina, class III 06/04/2014  . Thoracic aortic aneurysm   . Essential hypertension   . RESTLESS LEGS SYNDROME 08/31/2007  . Type II or unspecified type diabetes mellitus without mention of complication, not stated as uncontrolled 08/30/2007  . Hyperlipemia 08/30/2007  . PERSISTENT DISORDER INITIATING/MAINTAINING SLEEP 08/30/2007  . 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 HYPERTENSION 08/30/2007  . ARTHRITIS 08/30/2007  . Headache 08/30/2007    Past Surgical History  Procedure Laterality Date  . Thyroidectomy    . Meniscus repair Right   . Cardiac catheterization  06/07/2014    [facility]    Current Outpatient Rx  Name  Route  Sig  Dispense  Refill  . amLODipine (NORVASC) 5 MG tablet   Oral   Take 5 mg by mouth daily.         Marland Kitchen aspirin EC 81 MG tablet   Oral   Take 1 tablet (81 mg total) by mouth daily.   90 tablet   3   . atorvastatin (LIPITOR) 10 MG tablet      TAKE ONE (  1) TABLET EACH DAY   90 tablet   3   . DULoxetine (CYMBALTA) 60 MG capsule   Oral   Take 60 mg by mouth daily.         . fentaNYL (DURAGESIC - DOSED MCG/HR) 50 MCG/HR   Transdermal   Place 1 patch (50 mcg total) onto the skin every other day.   15 patch   0     To be filled after 05/12/2015   . gabapentin (NEURONTIN) 600 MG tablet      TAKE ONE TABLET BY MOUTH EVERY MORNING, TAKE ONE TABLET BY MOUTH MIDDAY, TAKE TWO TABLETS BY MOUTH AT BEDTIME.   120 tablet   5   . HYDROcodone-acetaminophen (NORCO/VICODIN) 5-325 MG per tablet   Oral   Take 1 tablet by mouth 2 (two) times daily as needed  for moderate pain.   60 tablet   0   . levothyroxine (SYNTHROID, LEVOTHROID) 137 MCG tablet   Oral   Take 137 mcg by mouth daily before breakfast.         . lisinopril (PRINIVIL,ZESTRIL) 10 MG tablet   Oral   Take 10 mg by mouth daily.         . metoprolol (LOPRESSOR) 50 MG tablet   Oral   Take 50 mg by mouth 2 (two) times daily.         . mirtazapine (REMERON) 30 MG tablet   Oral   Take 30 mg by mouth at bedtime.         . nabumetone (RELAFEN) 500 MG tablet   Oral   Take 500 mg by mouth 2 (two) times daily as needed.         . nabumetone (RELAFEN) 500 MG tablet      TAKE 1 TABLET BY MOUTH TWICE A DAY AS NEEDED   60 tablet   5     PT NEEDS REFILLS   . zolpidem (AMBIEN) 10 MG tablet   Oral   Take 10 mg by mouth at bedtime.           Allergies Declomycin; Demeclocycline hcl; Ginger; and Remicade  Family History  Problem Relation Age of Onset  . Hyperlipidemia Mother   . Heart attack Father 60  . Hypertension Father   . Hyperlipidemia Father   . Heart failure Father     Social History Social History  Substance Use Topics  . Smoking status: Never Smoker   . Smokeless tobacco: Not on file  . Alcohol Use: No    Review of Systems Constitutional: No fever/chills Eyes: No visual changes. ENT: No sore throat. Cardiovascular: Denies chest pain. Respiratory: Denies shortness of breath. Gastrointestinal: No abdominal pain.  No nausea, no vomiting.  No diarrhea.  No constipation. Genitourinary: Negative for dysuria. Musculoskeletal: Negative for back pain. Skin: Negative for rash. Neurological: Negative for headaches, focal weakness or numbness.  10-point ROS otherwise negative.  ____________________________________________   PHYSICAL EXAM:  VITAL SIGNS: ED Triage Vitals  Enc Vitals Group     BP 05/01/15 1146 116/66 mmHg     Pulse Rate 05/01/15 1146 58     Resp 05/01/15 1146 20     Temp 05/01/15 1146 98.1 F (36.7 C)     Temp Source  05/01/15 1146 Oral     SpO2 05/01/15 1146 98 %     Weight --      Height --      Head Cir --      Peak Flow --  Pain Score --      Pain Loc --      Pain Edu? --      Excl. in [place]? --     Constitutional: Alert and oriented. Well appearing and in no acute distress. Eyes: Conjunctivae are normal. PERRL. EOMI. Head: Atraumatic.  Nose: No congestion/rhinnorhea.  Mouth/Throat: Mucous membranes are moist.   Neck: No stridor.  No cervical spine tenderness to palpation. Hematological/Lymphatic/Immunilogical: No cervical lymphadenopathy. Cardiovascular: Normal rate, regular rhythm. Grossly normal heart sounds.  Good peripheral circulation. Respiratory: Normal respiratory effort.  No retractions. Lungs CTAB. Gastrointestinal: Soft and nontender. No distention. Normal Bowel sounds.   Musculoskeletal: No lower or upper extremity tenderness nor edema.  No joint effusions. Bilateral pedal pulses equal and easily palpated.  Neurologic:  Normal speech and language. No gross focal neurologic deficits are appreciated. No gait instability. Skin:  Skin is warm, dry and intact. No rash noted. Psychiatric: Mood and affect are normal. Speech and behavior are normal.  ____________________________________________   LABS (all labs ordered are listed, but only abnormal results are displayed)  Labs Reviewed - No data to display   INITIAL IMPRESSION / ASSESSMENT AND PLAN / ED COURSE  Pertinent labs & imaging results that were available during my care of the patient were reviewed by me and considered in my medical decision making (see chart for details).  Very well-appearing patient. No acute distress. Presents to ER for the complaint of rabies immunization. Patient presents for fourth of 4 rabies immunizations. Patient reports tolerating immunizations well. We'll proceed with fourth immunizations. Patient to continue to follow up with primary care physician. Discussed return parameters. Patient  verbalized understanding and agreed to plan. ____________________________________________   FINAL CLINICAL IMPRESSION(S) / ED DIAGNOSES  Final diagnoses:  Need for prophylactic vaccination against rabies       Marylene Land, NP 05/01/15 1207  Orbie Pyo, MD 05/01/15 2241

## 2015-05-01 NOTE — Discharge Instructions (Signed)
Continue to follow up with your primary care physician. Return to the ER for new or worsening concerns.  Rabies Vaccine What You Need to Know WHAT IS RABIES?  Rabies is a serious disease. It is caused by a virus.  Rabies is mainly a disease of animals. Humans get rabies when they are bitten by infected animals.  At first there might not be any symptoms. But weeks, or even years after a bite, rabies can cause pain, fatigue, headaches, fever, and irritability. These are followed by seizures, hallucinations, and paralysis. Human rabies is almost always fatal.  Wild animals, especially bats, are the most common source of human rabies infection in the [place]. Skunks, raccoons, dogs, cats, coyotes, foxes, and other mammals can also transmit the disease.  Human rabies is rare in the [place]. There have been only 57 cases diagnosed since 1990. However, between 16,000 and 39,000 people are vaccinated each year as a precaution after animal bites. Also, rabies is far more common in other parts of the world, with about 40,000 to 70,000 rabies-related deaths worldwide each year. Bites from unvaccinated dogs cause most of these cases. Rabies vaccine can prevent rabies. RABIES VACCINE  Rabies vaccine is given to people at high risk of rabies to protect them if they are exposed. It can also prevent the disease if it is given to a person after they have been exposed.  Rabies vaccine is made from killed rabies virus. It cannot cause rabies. WHO SHOULD GET RABIES VACCINE AND WHEN? Preventive Vaccination (No Exposure)  People at high risk of exposure to rabies, such as veterinarians, Insurance account manager, rabies laboratory workers, spelunkers, and rabies biologics production workers should be offered rabies vaccine.  The vaccine should also be considered for:  People whose activities bring them into frequent contact with rabies virus or with possibly rabid animals.  International travelers who are  likely to come in contact with animals in parts of the world where rabies is common.  The pre-exposure schedule for rabies vaccination is 3 doses, given at the following times:  Dose 1: As appropriate.  Dose 2: 7 days after Dose 1.  Dose 3: 21 days or 28 days after Dose 1.  For laboratory workers and others who may be repeatedly exposed to rabies virus, periodic testing for immunity is recommended and booster doses should be given as needed. (Testing or booster doses are not recommended for travelers). Ask your doctor for details. Vaccination After an Exposure Anyone who has been bitten by an animal, or who otherwise may have been exposed to rabies, should clean the wound and see a doctor immediately. The doctor will determine if they need to be vaccinated. A person who is exposed and has never been vaccinated against rabies should get 4 doses of rabies vaccine: one dose right away and additional doses on the 3rd, 7th, and 14th days. They should also get another shot called Rabies Immune Globulin at the same time as the first dose.  A person who has been previously vaccinated should get 2 doses of rabies vaccine: one right away and another on the 3rd day. Rabies Immune Globulin is not needed. TELL YOUR DOCTOR IF: Talk with a doctor before getting rabies vaccine if you:  Ever had a serious (life-threatening) allergic reaction to a previous dose of rabies vaccine or to any component of the vaccine; tell your doctor if you have any severe allergies.  Have a weakened immune system because of:  HIV, AIDS, or another disease  that affects the immune system.  Treatment with drugs that affect the immune system, such as steroids.  Cancer or cancer treatment with radiation or drugs. If you have a minor illness, such as a cold, you can be vaccinated. If you are moderately or severely ill, you should probably wait until you recover before getting a routine (non-exposure) dose of rabies vaccine. If you  have been exposed to rabies virus, you should get the vaccine regardless of any other illnesses you may have. WHAT ARE THE RISKS FROM RABIES VACCINE? A vaccine, like any medicine, is capable of causing serious problems, such as severe allergic reactions. The risk of a vaccine causing serious harm, or death, is extremely small. Serious problems from rabies vaccine are very rare.  Mild problems:  Soreness, redness, swelling, or itching where the shot was given (30% to 74%).  Headache, nausea, abdominal pain, muscle aches, or dizziness (5% to 40%). Moderate problems:  Hives, pain in the joints, or fever (about 6% of booster doses).  Other nervous system disorders, such as Guillain-Barr Syndrome (GBS), have been reported after rabies vaccine, but this happens so rarely that it is not known whether they are related to the vaccine. Note: Several brands of rabies vaccine are available in the [place], and reactions may vary between brands. Your provider can give you more information about a particular brand. WHAT IF THERE IS A SERIOUS REACTION? What should I look for? Look for anything that concerns you, such as signs of a severe allergic reaction, very high fever, or behavior changes.  Signs of a severe allergic reaction can include hives, swelling of the face and throat, difficulty breathing, a fast heartbeat, dizziness, and weakness. These would start a few minutes to a few hours after the vaccination. What should I do?  If you think it is a severe allergic reaction or other emergency that cannot wait, call 911 or get the person to the nearest hospital. Otherwise, call your doctor.  Afterward, the reaction should be reported to the Vaccine Adverse Event Reporting System (VAERS). Your doctor might file this report, or you can do it yourself through the VAERS website at www.vaers.SamedayNews.es or by calling 931-695-0788. VAERS is only for reporting reactions. They do not give medical advice. HOW  CAN I LEARN MORE?  Ask your doctor.  Call your local or state health department.  Contact the Centers for Disease Control and Prevention (CDC):  Visit the CDC rabies website at [place].no CDC Rabies Vaccine VIS (06/19/08) Document Released: 06/28/2006 Document Revised: 08/17/2012 Document Reviewed: 12/21/2012 [facility] Patient Information 2015 [place]. This information is not intended to replace advice given to you by your health care provider. Make sure you discuss any questions you have with your health care provider.

## 2015-05-17 ENCOUNTER — Other Ambulatory Visit: Payer: Self-pay | Admitting: Family Medicine

## 2015-05-17 DIAGNOSIS — F329 Major depressive disorder, single episode, unspecified: Secondary | ICD-10-CM

## 2015-05-17 DIAGNOSIS — F32A Depression, unspecified: Secondary | ICD-10-CM | POA: Insufficient documentation

## 2015-05-17 DIAGNOSIS — E785 Hyperlipidemia, unspecified: Secondary | ICD-10-CM

## 2015-05-24 ENCOUNTER — Other Ambulatory Visit: Payer: Self-pay | Admitting: Family Medicine

## 2015-05-24 DIAGNOSIS — G4700 Insomnia, unspecified: Secondary | ICD-10-CM

## 2015-05-24 NOTE — Telephone Encounter (Signed)
Last ov was on 12/05/2014. Last prescription refill was on 11/30/2014.  Thanks,

## 2015-05-24 NOTE — Telephone Encounter (Signed)
Ok to call in rx.  Thanks.  

## 2015-05-24 NOTE — Telephone Encounter (Signed)
Called prescription in.  SF

## 2015-05-31 ENCOUNTER — Other Ambulatory Visit: Payer: Self-pay | Admitting: Family Medicine

## 2015-05-31 DIAGNOSIS — I10 Essential (primary) hypertension: Secondary | ICD-10-CM

## 2015-06-13 ENCOUNTER — Other Ambulatory Visit: Payer: Self-pay

## 2015-06-13 ENCOUNTER — Other Ambulatory Visit: Payer: Self-pay | Admitting: Family Medicine

## 2015-06-13 DIAGNOSIS — L4050 Arthropathic psoriasis, unspecified: Secondary | ICD-10-CM

## 2015-06-13 MED ORDER — FENTANYL 50 MCG/HR TD PT72: 50.0000 ug | MEDICATED_PATCH | TRANSDERMAL | Status: DC

## 2015-06-13 NOTE — Telephone Encounter (Signed)
Printed.  Thanks.  

## 2015-06-13 NOTE — Telephone Encounter (Signed)
Pt would like to pick up Three prescriptions.  Contact Number [phone]  Thanks,   -Mickel Baas

## 2015-07-12 ENCOUNTER — Other Ambulatory Visit: Payer: Self-pay | Admitting: Family Medicine

## 2015-07-12 DIAGNOSIS — I719 Aortic aneurysm of unspecified site, without rupture: Secondary | ICD-10-CM | POA: Insufficient documentation

## 2015-07-12 DIAGNOSIS — I10 Essential (primary) hypertension: Secondary | ICD-10-CM

## 2015-07-12 DIAGNOSIS — L817 Pigmented purpuric dermatosis: Secondary | ICD-10-CM | POA: Insufficient documentation

## 2015-07-12 DIAGNOSIS — Z87898 Personal history of other specified conditions: Secondary | ICD-10-CM

## 2015-07-12 DIAGNOSIS — R55 Syncope and collapse: Secondary | ICD-10-CM | POA: Insufficient documentation

## 2015-07-12 DIAGNOSIS — R208 Other disturbances of skin sensation: Secondary | ICD-10-CM

## 2015-07-15 ENCOUNTER — Encounter: Payer: Self-pay | Admitting: Family Medicine

## 2015-07-15 ENCOUNTER — Ambulatory Visit (INDEPENDENT_AMBULATORY_CARE_PROVIDER_SITE_OTHER): Payer: BC Managed Care – PPO | Admitting: Family Medicine

## 2015-07-15 VITALS — BP 116/70 | HR 68 | Temp 98.5°F | Resp 16 | Ht 71.0 in | Wt 226.4 lb

## 2015-07-15 DIAGNOSIS — R55 Syncope and collapse: Secondary | ICD-10-CM | POA: Diagnosis not present

## 2015-07-15 DIAGNOSIS — R0789 Other chest pain: Secondary | ICD-10-CM

## 2015-07-15 DIAGNOSIS — I10 Essential (primary) hypertension: Secondary | ICD-10-CM

## 2015-07-15 DIAGNOSIS — E119 Type 2 diabetes mellitus without complications: Secondary | ICD-10-CM

## 2015-07-15 NOTE — Progress Notes (Signed)
Subjective:    Patient ID: Keith Carpenter, male    DOB: 1955-12-01, 59 y.o.   MRN: [id number]  Dizziness This is a new (Pt has noticed dizziness alongside elevated BP and elevated BS. Had episode of nause, vomtiing,  diarrhea and  syncope. ) problem. The current episode started in the past 7 days. The problem occurs intermittently. The problem has been waxing and waning. Associated symptoms include fatigue, headaches, nausea, vertigo and vomiting. Pertinent negatives include no abdominal pain, anorexia, arthralgias, chills, diaphoresis, fever, myalgias, neck pain, numbness, visual change or weakness. Chest pain: "tight chest" with dizziness. Nothing aggravates the symptoms. He has tried nothing for the symptoms. The treatment provided no relief.  Hypertension The problem has been waxing and waning since onset. The problem is uncontrolled. Associated symptoms include headaches, malaise/fatigue and peripheral edema. Pertinent negatives include no anxiety, blurred vision, neck pain, orthopnea, palpitations or sweats. Chest pain: "tight chest" with dizziness. Shortness of breath: with dizziness. Treatments tried: Amlodipine 5 mg, Metoprolol 50 mg BID, Lisinopril 10 mg. There are no compliance problems.   Highest BP has been was 186/114 on Monday, 07/08/2015. Just not stable for last week.  Balance has been off.   Just not stable. Feels like has to careful when stepping. Room not spinning. Is feeling better.  Is working as a Oceanographer 3 days a week.     Review of Systems  Constitutional: Positive for malaise/fatigue and fatigue. Negative for fever, chills and diaphoresis.  Eyes: Negative for blurred vision.  Respiratory: Shortness of breath: with dizziness.   Cardiovascular: Negative for palpitations and orthopnea. Chest pain: "tight chest" with dizziness.  Gastrointestinal: Positive for nausea, vomiting and diarrhea. Negative for abdominal pain and anorexia.  Musculoskeletal: Negative  for myalgias, arthralgias and neck pain.  Neurological: Positive for dizziness, vertigo, syncope and headaches. Negative for weakness and numbness.   BP 116/70 mmHg  Pulse 68  Temp(Src) 98.5 F (36.9 C) (Oral)  Resp 16  Ht 5\' 11"  (1.803 m)  Wt 226 lb 6.4 oz (102.694 kg)  BMI 31.59 kg/m2   Patient Active Problem List   Diagnosis Date Noted  . AA (aortic aneurysm) ([place]) 07/12/2015  . H/O disease 07/12/2015  . Calcium deficiency disease 07/12/2015  . Burning sensation of the foot 07/12/2015  . 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 Neurocardiogenic syncope 07/12/2015  . Clinical depression 05/17/2015  . Psoriatic arthritis ([place]) 03/12/2015  . Aneurysm of thoracic aorta ([place]) 01/28/2015  . Chest pain 06/21/2014  . Angina, class III ([place]) 06/04/2014  . Angina effort ([place]) 06/04/2014  . Exercise-induced angina ([place]) 06/04/2014  . Thoracic aortic aneurysm ([place])   . Essential hypertension   . Aneurysm of ascending aorta (HCC) 04/18/2014  . Blood glucose elevated 04/18/2014  . Adynamia 04/18/2014  . Ascending aortic aneurysm ([place]) 04/18/2014  . Personal history of other diseases of the nervous system and sense organs 04/18/2014  . Avitaminosis D 07/15/2009  . Colonic constipation 02/24/2008  . Deficiency of parathyrin ([place]) 02/22/2008  . Episodic paroxysmal anxiety disorder 02/22/2008  . RESTLESS LEGS SYNDROME 08/31/2007  . Type II or unspecified type diabetes mellitus without mention of complication, not stated as uncontrolled 08/30/2007  . Hyperlipemia 08/30/2007  . PERSISTENT DISORDER INITIATING/MAINTAINING SLEEP 08/30/2007  . 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 Essential hypertension 08/30/2007  . ARTHRITIS 08/30/2007  . Headache 08/30/2007  . Cannot sleep 08/17/2007  . Disorder of male genital organ 07/08/2007  . Diabetes ([place]) 12/26/2006  . Acid reflux  11/26/2006  . Gastric ulcer 11/26/2006  . Hypercholesteremia 11/26/2006  . Headache, migraine  11/26/2006  . Depression, neurotic 11/25/2006  . Arthritis with psoriasis ([place]) 11/25/2006  . Current tobacco use 11/22/2006   Past Medical History  Diagnosis Date  . Schamberg disease   . Paresthesia of foot   . Nausea   . Hypocalcemia   . H/O Guillain-Barre syndrome   . Dysthymic disorder   . Psoriatic arthritis ([place])   . Hyperlipidemia   . Hypothyroidism   . Gastric ulcer   . Esophageal reflux   . Genital disorder, male   . Insomnia   . Panic disorder   . Colonic constipation   . Vitamin D deficiency   . Syncope and collapse   . Diabetes mellitus without complication ([place])   . Thoracic aortic aneurysm (HCC)     4.4 cm followed at [facility]  . Essential hypertension    Current Outpatient Prescriptions on File Prior to Visit  Medication Sig  . amLODipine (NORVASC) 5 MG tablet TAKE ONE (1) TABLET EACH DAY  . atorvastatin (LIPITOR) 10 MG tablet TAKE ONE (1) TABLET EACH DAY  . DULoxetine (CYMBALTA) 60 MG capsule TAKE ONE CAPSULE BY MOUTH DAILY  . fentaNYL (DURAGESIC - DOSED MCG/HR) 50 MCG/HR Place 1 patch (50 mcg total) onto the skin every other day. To be filled after 08/13/15  . gabapentin (NEURONTIN) 600 MG tablet TAKE ONE TABLET BY MOUTH EVERY MORNING, TAKE ONE TABLET BY MOUTH MIDDAY, TAKE TWO TABLETS BY MOUTH AT BEDTIME.  Marland Kitchen HYDROcodone-acetaminophen (NORCO/VICODIN) 5-325 MG per tablet Take 1 tablet by mouth 2 (two) times daily as needed for moderate pain.  Marland Kitchen levothyroxine (SYNTHROID, LEVOTHROID) 137 MCG tablet Take 137 mcg by mouth daily before breakfast.  . lisinopril (PRINIVIL,ZESTRIL) 10 MG tablet Take 10 mg by mouth daily.  . metoprolol (LOPRESSOR) 50 MG tablet TAKE ONE (1) TABLET BY MOUTH TWO (2) TIMES DAILY  . mirtazapine (REMERON) 30 MG tablet Take 30 mg by mouth at bedtime.  . nabumetone (RELAFEN) 500 MG tablet TAKE 1 TABLET BY MOUTH TWICE A DAY AS NEEDED  . zolpidem (AMBIEN) 10 MG tablet TAKE ONE TABLET AT BEDTIME  . aspirin EC 81 MG tablet Take 1 tablet (81 mg total)  by mouth daily. (Patient not taking: Reported on 07/15/2015)   No current facility-administered medications on file prior to visit.   Allergies  Allergen Reactions  . Declomycin [Demeclocycline]   . Demeclocycline Hcl   . Ginger   . Remicade [Infliximab]   . Sulfa Antibiotics   . Suvorexant Other (See Comments)    vision changes   Past Surgical History  Procedure Laterality Date  . Thyroidectomy    . Meniscus repair Right   . Cardiac catheterization  06/07/2014    [facility]   Social History   Social History  . Marital Status: Divorced    Spouse Name: N/A  . Number of Children: N/A  . Years of Education: N/A   Occupational History  . Not on file.   Social History Main Topics  . Smoking status: Never Smoker   . Smokeless tobacco: Never Used  . Alcohol Use: No  . Drug Use: No  . Sexual Activity: Not on file   Other Topics Concern  . Not on file   Social History Narrative   Family History  Problem Relation Age of Onset  . Hyperlipidemia Mother   . Heart attack Father 71  . Hypertension Father   . Hyperlipidemia Father   .  Heart failure Father       Objective:   Physical Exam  Constitutional: He is oriented to person, place, and time. He appears well-developed and well-nourished.  Cardiovascular: Normal rate and regular rhythm.   Pulmonary/Chest: Effort normal and breath sounds normal.  Neurological: He is alert and oriented to person, place, and time. No cranial nerve deficit. He displays a negative Romberg sign. Coordination normal.  Psychiatric: He has a normal mood and affect. His behavior is normal. Judgment and thought content normal.   BP 116/70 mmHg  Pulse 68  Temp(Src) 98.5 F (36.9 C) (Oral)  Resp 16  Ht 5\' 11"  (1.803 m)  Wt 226 lb 6.4 oz (102.694 kg)  BMI 31.59 kg/m2     Assessment & Plan:  1. Atypical chest pain New problem. Has had negative work up in past. Does have aneurysm. EKG unchanged. Will refer to cardiology to evaluate and treat.  Continue ASA and statin.  ER if has pain that does not resolve.  - EKG 12-Lead - CBC with Differential/Platelet - Comprehensive metabolic panel - Ambulatory referral to Cardiology  2. Type 2 diabetes mellitus without complication, without long-term current use of insulin (HCC) BS improved now. Suspect elevated secondary to viral syndrome. Continue to monitor.    3. Essential hypertension Improved also. Suspect elevated after stressful syncopal event. Continue to monitor.   4. Neurocardiogenic syncope Has had previously. Suspect vasovagal event. Was over a week ago. Improving. Referral to cardiology as noted.   Margarita Rana, MD

## 2015-07-16 LAB — COMPREHENSIVE METABOLIC PANEL
ALT: 41 IU/L (ref 0–44)
AST: 27 IU/L (ref 0–40)
Albumin/Globulin Ratio: 1.9 (ref 1.1–2.5)
Albumin: 4.3 g/dL (ref 3.5–5.5)
Alkaline Phosphatase: 94 IU/L (ref 39–117)
BUN/Creatinine Ratio: 20 (ref 9–20)
BUN: 18 mg/dL (ref 6–24)
Bilirubin Total: 0.3 mg/dL (ref 0.0–1.2)
CO2: 25 mmol/L (ref 18–29)
Calcium: 8.5 mg/dL — ABNORMAL LOW (ref 8.7–10.2)
Chloride: 97 mmol/L (ref 97–106)
Creatinine, Ser: 0.9 mg/dL (ref 0.76–1.27)
GFR, EST AFRICAN AMERICAN: 109 mL/min/{1.73_m2} (ref >59)
GFR, EST NON AFRICAN AMERICAN: 94 mL/min/{1.73_m2} (ref >59)
Globulin, Total: 2.3 g/dL (ref 1.5–4.5)
Glucose: 119 mg/dL — ABNORMAL HIGH (ref 65–99)
Potassium: 4.9 mmol/L (ref 3.5–5.2)
Sodium: 139 mmol/L (ref 136–144)
TOTAL PROTEIN: 6.6 g/dL (ref 6.0–8.5)

## 2015-07-16 LAB — CBC WITH DIFFERENTIAL/PLATELET
Basophils Absolute: 0 10*3/uL (ref 0.0–0.2)
Basos: 0 %
EOS (ABSOLUTE): 0.2 10*3/uL (ref 0.0–0.4)
EOS: 3 %
HEMATOCRIT: 39.8 % (ref 37.5–51.0)
Hemoglobin: 13.8 g/dL (ref 12.6–17.7)
IMMATURE GRANS (ABS): 0 10*3/uL (ref 0.0–0.1)
Immature Granulocytes: 0 %
LYMPHS: 25 %
Lymphocytes Absolute: 1.9 10*3/uL (ref 0.7–3.1)
MCH: 29.6 pg (ref 26.6–33.0)
MCHC: 34.7 g/dL (ref 31.5–35.7)
MCV: 85 fL (ref 79–97)
MONOCYTES: 7 %
MONOS ABS: 0.5 10*3/uL (ref 0.1–0.9)
NEUTROS PCT: 65 %
Neutrophils Absolute: 5.1 10*3/uL (ref 1.4–7.0)
Platelets: 276 10*3/uL (ref 150–379)
RBC: 4.66 x10E6/uL (ref 4.14–5.80)
RDW: 13.1 % (ref 12.3–15.4)
WBC: 7.9 10*3/uL (ref 3.4–10.8)

## 2015-07-17 ENCOUNTER — Telehealth: Payer: Self-pay

## 2015-07-17 NOTE — Telephone Encounter (Signed)
-----   Message from Margarita Rana, MD sent at 07/16/2015  7:03 AM EDT ----- Labs stable except for slightly low calcium. Make sure taking his calcium supplements regularly. Thanks.

## 2015-07-17 NOTE — Telephone Encounter (Signed)
Left message to call back  

## 2015-07-17 NOTE — Telephone Encounter (Signed)
Left detailed message on VM, per DPR on file. sd

## 2015-07-23 ENCOUNTER — Encounter: Payer: Self-pay | Admitting: Cardiovascular Disease

## 2015-07-23 ENCOUNTER — Ambulatory Visit (INDEPENDENT_AMBULATORY_CARE_PROVIDER_SITE_OTHER): Payer: BC Managed Care – PPO | Admitting: Cardiovascular Disease

## 2015-07-23 VITALS — BP 100/64 | HR 53 | Ht 71.0 in | Wt 219.8 lb

## 2015-07-23 DIAGNOSIS — R079 Chest pain, unspecified: Secondary | ICD-10-CM | POA: Diagnosis not present

## 2015-07-23 DIAGNOSIS — I712 Thoracic aortic aneurysm, without rupture: Secondary | ICD-10-CM | POA: Diagnosis not present

## 2015-07-23 DIAGNOSIS — R0602 Shortness of breath: Secondary | ICD-10-CM | POA: Diagnosis not present

## 2015-07-23 DIAGNOSIS — I10 Essential (primary) hypertension: Secondary | ICD-10-CM | POA: Diagnosis not present

## 2015-07-23 DIAGNOSIS — I7121 Aneurysm of the ascending aorta, without rupture: Secondary | ICD-10-CM

## 2015-07-23 NOTE — Assessment & Plan Note (Signed)
His blood pressure is now optimally controlled. He is mildly bradycardic but overall is asymptomatic.

## 2015-07-23 NOTE — Assessment & Plan Note (Signed)
This is being followed at [facility].

## 2015-07-23 NOTE — Progress Notes (Signed)
Primary care physician: Dr. Venia Minks  HPI  This is a 59 year old man who is here today for reevaluation of chest pain.  He has known history of hypertension, hyperlipidemia,  diabetes and Guillain -Barre syndrome. He was hospitalized at [facility] in August 2015 with chest pain and syncope. He was found to be severely hypertensive on presentation with blood pressure 220/138 .    Echocardiogram showed dilated aorta and he was later confirmed to have a thoracic aortic aneurysm measuring 4.4 cm. He is now followed by Dr. Sammuel Hines for this. Most recent evaluation showed stable size at 4.5 cm. I proceeded with cardiac catheterization in November 2015 due to recurrent chest pain. The catheterization showed normal coronary arteries and ejection fraction.  Recently, he became ill with vomiting and diarrhea. He became hypertensive with a blood pressure of 180/100. This was followed by substernal chest tightness and shortness of breath associated with blurred vision and numbness. The symptoms did not last long overall. He subsequently improved without intervention and he reports feeling back to his waistline with no recurrent symptoms.  Allergies  Allergen Reactions  . Declomycin [Demeclocycline]   . Demeclocycline Hcl   . Ginger   . Remicade [Infliximab]   . Suvorexant Other (See Comments)    vision changes     Current Outpatient Prescriptions on File Prior to Visit  Medication Sig Dispense Refill  . amLODipine (NORVASC) 5 MG tablet TAKE ONE (1) TABLET EACH DAY 30 tablet 5  . aspirin EC 81 MG tablet Take 1 tablet (81 mg total) by mouth daily. 90 tablet 3  . atorvastatin (LIPITOR) 10 MG tablet TAKE ONE (1) TABLET EACH DAY 90 tablet 3  . DULoxetine (CYMBALTA) 60 MG capsule TAKE ONE CAPSULE BY MOUTH DAILY 90 capsule 3  . fentaNYL (DURAGESIC - DOSED MCG/HR) 50 MCG/HR Place 1 patch (50 mcg total) onto the skin every other day. To be filled after 08/13/15 15 patch 0  . gabapentin (NEURONTIN) 600 MG tablet TAKE  ONE TABLET BY MOUTH EVERY MORNING, TAKE ONE TABLET BY MOUTH MIDDAY, TAKE TWO TABLETS BY MOUTH AT BEDTIME. 120 tablet 5  . HYDROcodone-acetaminophen (NORCO/VICODIN) 5-325 MG per tablet Take 1 tablet by mouth 2 (two) times daily as needed for moderate pain. 60 tablet 0  . levothyroxine (SYNTHROID, LEVOTHROID) 137 MCG tablet Take 137 mcg by mouth daily before breakfast.    . lisinopril (PRINIVIL,ZESTRIL) 10 MG tablet Take 10 mg by mouth daily.    . metoprolol (LOPRESSOR) 50 MG tablet TAKE ONE (1) TABLET BY MOUTH TWO (2) TIMES DAILY 60 tablet 5  . mirtazapine (REMERON) 30 MG tablet Take 30 mg by mouth at bedtime.    . nabumetone (RELAFEN) 500 MG tablet TAKE 1 TABLET BY MOUTH TWICE A DAY AS NEEDED 60 tablet 5  . zolpidem (AMBIEN) 10 MG tablet TAKE ONE TABLET AT BEDTIME 30 tablet 5   No current facility-administered medications on file prior to visit.     Past Medical History  Diagnosis Date  . Schamberg disease   . Paresthesia of foot   . Nausea   . Hypocalcemia   . H/O Guillain-Barre syndrome   . Dysthymic disorder   . Psoriatic arthritis ([place])   . Hyperlipidemia   . Hypothyroidism   . Gastric ulcer   . Esophageal reflux   . Genital disorder, male   . Insomnia   . Panic disorder   . Colonic constipation   . Vitamin D deficiency   . Syncope and collapse   .  Diabetes mellitus without complication ([place])   . Thoracic aortic aneurysm (HCC)     4.4 cm followed at [facility]  . Essential hypertension      Past Surgical History  Procedure Laterality Date  . Thyroidectomy    . Meniscus repair Right   . Cardiac catheterization  06/07/2014    [facility]     Family History  Problem Relation Age of Onset  . Hyperlipidemia Mother   . Heart attack Father 83  . Hypertension Father   . Hyperlipidemia Father   . Heart failure Father      Social History   Social History  . Marital Status: Divorced    Spouse Name: N/A  . Number of Children: N/A  . Years of Education: N/A   Occupational  History  . Not on file.   Social History Main Topics  . Smoking status: Never Smoker   . Smokeless tobacco: Never Used  . Alcohol Use: No  . Drug Use: No  . Sexual Activity: Not on file   Other Topics Concern  . Not on file   Social History Narrative     ROS A 10 point review of system was performed. It is negative other than that mentioned in the history of present illness.   PHYSICAL EXAM   BP 100/64 mmHg  Pulse 53  Ht 5\' 11"  (1.803 m)  Wt 219 lb 12 oz (99.678 kg)  BMI 30.66 kg/m2 Constitutional: He is oriented to person, place, and time. He appears well-developed and well-nourished. No distress.  HENT: No nasal discharge.  Head: Normocephalic and atraumatic.  Eyes: Pupils are equal and round.  No discharge. Neck: Normal range of motion. Neck supple. No JVD present. No thyromegaly present.  Cardiovascular: Normal rate, regular rhythm, normal heart sounds. Exam reveals no gallop and no friction rub. No murmur heard.  Pulmonary/Chest: Effort normal and breath sounds normal. No stridor. No respiratory distress. He has no wheezes. He has no rales. He exhibits no tenderness.  Abdominal: Soft. Bowel sounds are normal. He exhibits no distension. There is no tenderness. There is no rebound and no guarding.  Musculoskeletal: Normal range of motion. He exhibits no edema and no tenderness.  Neurological: He is alert and oriented to person, place, and time. Coordination normal.  Skin: Skin is warm and dry. No rash noted. He is not diaphoretic. No erythema. No pallor.  Psychiatric: He has a normal mood and affect. His behavior is normal. Judgment and thought content normal.  Right radial pulse is normal with no hematoma     [id number]  Rhythm  -First degree A-V block  PRi = 224  BORDERLINE RHYTHM   ASSESSMENT AND PLAN

## 2015-07-23 NOTE — Patient Instructions (Signed)
Medication Instructions: Continue same medications.   Labwork: None.   Procedures/Testing: None.   Follow-Up:  as needed with Dr. Fletcher Anon.   Any Additional Special Instructions Will Be Listed Below (If Applicable).

## 2015-07-23 NOTE — Assessment & Plan Note (Signed)
I suspect that this was likely due to his elevated blood pressure which was related to a viral illness or food poisoning. He reports complete resolution of symptoms. Cardiac catheterization last year showed normal coronary arteries. His aortic aneurysm has been stable in size. His EKG today does not show any acute changes. Due to all of the above, I do not recommend further testing at the present time unless he has recurrent symptoms.

## 2015-08-02 ENCOUNTER — Other Ambulatory Visit: Payer: Self-pay | Admitting: Family Medicine

## 2015-08-09 ENCOUNTER — Other Ambulatory Visit: Payer: Self-pay | Admitting: Family Medicine

## 2015-08-09 DIAGNOSIS — E039 Hypothyroidism, unspecified: Secondary | ICD-10-CM

## 2015-08-12 DIAGNOSIS — E039 Hypothyroidism, unspecified: Secondary | ICD-10-CM | POA: Insufficient documentation

## 2015-08-30 ENCOUNTER — Other Ambulatory Visit: Payer: Self-pay | Admitting: Family Medicine

## 2015-08-30 DIAGNOSIS — G2581 Restless legs syndrome: Secondary | ICD-10-CM

## 2015-08-30 DIAGNOSIS — L4050 Arthropathic psoriasis, unspecified: Secondary | ICD-10-CM

## 2015-09-10 ENCOUNTER — Other Ambulatory Visit: Payer: Self-pay

## 2015-09-10 DIAGNOSIS — L4050 Arthropathic psoriasis, unspecified: Secondary | ICD-10-CM

## 2015-09-10 MED ORDER — FENTANYL 50 MCG/HR TD PT72: 50.0000 ug | MEDICATED_PATCH | TRANSDERMAL | Status: DC

## 2015-09-10 NOTE — Telephone Encounter (Signed)
Last ov 07/15/15. sd

## 2015-09-10 NOTE — Telephone Encounter (Signed)
Prescription printed. Please notify patient it is ready for pick up. Thanks- Dr. Darionna Banke.  

## 2015-09-11 ENCOUNTER — Other Ambulatory Visit: Payer: Self-pay

## 2015-09-11 DIAGNOSIS — L4050 Arthropathic psoriasis, unspecified: Secondary | ICD-10-CM

## 2015-09-11 MED ORDER — FENTANYL 50 MCG/HR TD PT72: 50.0000 ug | MEDICATED_PATCH | TRANSDERMAL | Status: DC

## 2015-10-03 ENCOUNTER — Other Ambulatory Visit: Payer: Self-pay | Admitting: Family Medicine

## 2015-10-03 DIAGNOSIS — L4050 Arthropathic psoriasis, unspecified: Secondary | ICD-10-CM

## 2015-10-04 NOTE — Telephone Encounter (Signed)
Prescription printed. Please notify patient it is ready for pick up. Thanks- Dr. Enaya Howze.  

## 2015-10-10 ENCOUNTER — Other Ambulatory Visit: Payer: Self-pay | Admitting: Family Medicine

## 2015-10-10 DIAGNOSIS — L4050 Arthropathic psoriasis, unspecified: Secondary | ICD-10-CM

## 2015-10-14 NOTE — Telephone Encounter (Signed)
Prescription printed. Please notify patient it is ready for pick up. Thanks- Dr. Alleen Kehm.  

## 2015-10-31 ENCOUNTER — Other Ambulatory Visit: Payer: Self-pay | Admitting: Family Medicine

## 2015-10-31 DIAGNOSIS — G4700 Insomnia, unspecified: Secondary | ICD-10-CM

## 2015-10-31 NOTE — Telephone Encounter (Signed)
Printed, please fax or call in to pharmacy. Thank you.   

## 2015-12-06 ENCOUNTER — Other Ambulatory Visit: Payer: Self-pay

## 2015-12-06 ENCOUNTER — Telehealth: Payer: Self-pay | Admitting: Family Medicine

## 2015-12-06 ENCOUNTER — Other Ambulatory Visit: Payer: Self-pay | Admitting: Family Medicine

## 2015-12-06 DIAGNOSIS — L4050 Arthropathic psoriasis, unspecified: Secondary | ICD-10-CM

## 2015-12-06 DIAGNOSIS — I10 Essential (primary) hypertension: Secondary | ICD-10-CM

## 2015-12-06 MED ORDER — FENTANYL 50 MCG/HR TD PT72: 50.0000 ug | MEDICATED_PATCH | TRANSDERMAL | Status: DC

## 2015-12-06 NOTE — Telephone Encounter (Signed)
Needs ov if not scheduled. Thanks.

## 2015-12-06 NOTE — Telephone Encounter (Signed)
Pt advised apt made for 01/01/2016  Thanks,   -Mickel Baas

## 2016-01-01 ENCOUNTER — Ambulatory Visit: Payer: Self-pay | Admitting: Family Medicine

## 2016-01-06 ENCOUNTER — Encounter: Payer: Self-pay | Admitting: Family Medicine

## 2016-01-06 ENCOUNTER — Ambulatory Visit (INDEPENDENT_AMBULATORY_CARE_PROVIDER_SITE_OTHER): Payer: BC Managed Care – PPO | Admitting: Family Medicine

## 2016-01-06 VITALS — BP 140/82 | HR 64 | Temp 98.2°F | Resp 16 | Wt 231.0 lb

## 2016-01-06 DIAGNOSIS — L4050 Arthropathic psoriasis, unspecified: Secondary | ICD-10-CM | POA: Diagnosis not present

## 2016-01-06 DIAGNOSIS — E208 Other hypoparathyroidism: Secondary | ICD-10-CM | POA: Diagnosis not present

## 2016-01-06 DIAGNOSIS — M79671 Pain in right foot: Secondary | ICD-10-CM | POA: Diagnosis not present

## 2016-01-06 DIAGNOSIS — G4700 Insomnia, unspecified: Secondary | ICD-10-CM

## 2016-01-06 DIAGNOSIS — E785 Hyperlipidemia, unspecified: Secondary | ICD-10-CM

## 2016-01-06 DIAGNOSIS — I10 Essential (primary) hypertension: Secondary | ICD-10-CM

## 2016-01-06 DIAGNOSIS — Z8639 Personal history of other endocrine, nutritional and metabolic disease: Secondary | ICD-10-CM | POA: Insufficient documentation

## 2016-01-06 DIAGNOSIS — E039 Hypothyroidism, unspecified: Secondary | ICD-10-CM

## 2016-01-06 DIAGNOSIS — G610 Guillain-Barre syndrome: Secondary | ICD-10-CM

## 2016-01-06 DIAGNOSIS — E119 Type 2 diabetes mellitus without complications: Secondary | ICD-10-CM

## 2016-01-06 DIAGNOSIS — E209 Hypoparathyroidism, unspecified: Secondary | ICD-10-CM

## 2016-01-06 LAB — POCT GLYCOSYLATED HEMOGLOBIN (HGB A1C)
Est. average glucose Bld gHb Est-mCnc: 154
Hemoglobin A1C: 7

## 2016-01-06 MED ORDER — FENTANYL 50 MCG/HR TD PT72: 50.0000 ug | MEDICATED_PATCH | TRANSDERMAL | Status: DC

## 2016-01-06 MED ORDER — LEVOTHYROXINE SODIUM 137 MCG PO TABS: 137.0000 ug | ORAL_TABLET | Freq: Every day | ORAL | Status: DC

## 2016-01-06 MED ORDER — HYDROCODONE-ACETAMINOPHEN 5-325 MG PO TABS: 1.0000 | ORAL_TABLET | Freq: Two times a day (BID) | ORAL | Status: DC | PRN

## 2016-01-06 MED ORDER — ATORVASTATIN CALCIUM 10 MG PO TABS: 10.0000 mg | ORAL_TABLET | Freq: Every evening | ORAL | Status: DC

## 2016-01-06 NOTE — Progress Notes (Signed)
Subjective:    Patient ID: Keith Carpenter, male    DOB: [birth year], 60 y.o.   MRN: [id number]  Diabetes He presents for his follow-up (Last A1C was 12/05/2014 and was 7.6%) diabetic visit. He has type 2 diabetes mellitus. (Hypoglycemic sx include presyncope. This happens rarely.) Associated symptoms include chest pain ("tightness"; sees cardiology), fatigue, foot paresthesias, polydipsia and polyuria. Pertinent negatives for diabetes include no blurred vision, no foot ulcerations, no polyphagia, no visual change, no weakness and no weight loss. Symptoms are worsening. Diabetic complications include autonomic neuropathy. When asked about current treatments, none (has tried 2 different medications (pt could not remember names). Pt could not tolerate these due to GI upset.) were reported. He is following a generally unhealthy (pt admits diet has worsened since starting back to work) diet. He participates in exercise daily (walks daily for 2 miles). His home blood glucose trend is fluctuating minimally. An ACE inhibitor/angiotensin II receptor blocker is being taken. Eye exam is current.  Arthritis Presents for follow-up visit. The disease course has been stable. He complains of pain, stiffness and joint swelling. Associated symptoms include fatigue. Pertinent negatives include no weight loss. Past treatments include an opioid. The treatment provided moderate relief.      Review of Systems  Constitutional: Positive for fatigue. Negative for weight loss.  Eyes: Negative for blurred vision.  Cardiovascular: Positive for chest pain ("tightness"; sees cardiology).  Endocrine: Positive for polydipsia and polyuria. Negative for polyphagia.  Musculoskeletal: Positive for joint swelling, arthritis and stiffness.  Neurological: Negative for weakness.   BP 140/82 mmHg  Pulse 64  Temp(Src) 98.2 F (36.8 C) (Oral)  Resp 16  Wt 231 lb (104.781 kg)   Patient Active Problem List   Diagnosis Date  Noted  . Hypothyroidism 08/12/2015  . H/O disease 07/12/2015  . Calcium deficiency disease 07/12/2015  . Burning sensation of the foot 07/12/2015  . 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 Neurocardiogenic syncope 07/12/2015  . Clinical depression 05/17/2015  . Psoriatic arthritis ([place]) 03/12/2015  . Aneurysm of thoracic aorta ([place]) 01/28/2015  . Chest pain 06/21/2014  . Thoracic aortic aneurysm ([place])   . Essential hypertension   . Aneurysm of ascending aorta (HCC) 04/18/2014  . Blood glucose elevated 04/18/2014  . Adynamia 04/18/2014  . Personal history of other diseases of the nervous system and sense organs 04/18/2014  . Avitaminosis D 07/15/2009  . Colonic constipation 02/24/2008  . 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 Episodic paroxysmal anxiety disorder 02/22/2008  . RESTLESS LEGS SYNDROME 08/31/2007  . Type 2 diabetes mellitus ([place]) 08/30/2007  . Hyperlipemia 08/30/2007  . PERSISTENT DISORDER INITIATING/MAINTAINING SLEEP 08/30/2007  . 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 Headache 08/30/2007  . Cannot sleep 08/17/2007  . Disorder of male genital organ 07/08/2007  . Diabetes ([place]) 12/26/2006  . Acid reflux 11/26/2006  . Gastric ulcer 11/26/2006  . Hypercholesteremia 11/26/2006  . Headache, migraine 11/26/2006  . Depression, neurotic 11/25/2006  . Current tobacco use 11/22/2006   Past Medical History  Diagnosis Date  . Schamberg disease   . Paresthesia of foot   . Nausea   . Hypocalcemia   . H/O Guillain-Barre syndrome   . Dysthymic disorder   . Psoriatic arthritis ([place])   . Hyperlipidemia   . Hypothyroidism   . Gastric ulcer   . Esophageal reflux   . Genital disorder, male   . Insomnia   . Panic disorder   . Colonic constipation   . Vitamin D deficiency   .  Syncope and collapse   . Diabetes mellitus without complication ([place])   . Thoracic aortic aneurysm (HCC)     4.4 cm followed at [facility]  . Essential hypertension     Current Outpatient Prescriptions on File Prior to Visit  Medication Sig  . amLODipine (NORVASC) 5 MG tablet TAKE ONE (1) TABLET EACH DAY  . atorvastatin (LIPITOR) 10 MG tablet TAKE ONE (1) TABLET EACH DAY  . DULoxetine (CYMBALTA) 60 MG capsule TAKE ONE CAPSULE BY MOUTH DAILY  . fentaNYL (DURAGESIC - DOSED MCG/HR) 50 MCG/HR Place 1 patch (50 mcg total) onto the skin every other day. To be filled after 02/05/16  . gabapentin (NEURONTIN) 600 MG tablet TAKE ONE TABLET BY MOUTH EVERY MORNING, TAKE ONE TABLET BY MOUTH MIDDAY, TAKE TWO TABLETS BY MOUTH AT BEDTIME.  Marland Kitchen HYDROcodone-acetaminophen (NORCO/VICODIN) 5-325 MG tablet TAKE ONE TABLET TWICE DAILY AS NEEDED FOR MODERATE PAIN  . levothyroxine (SYNTHROID, LEVOTHROID) 137 MCG tablet TAKE 1 TABLET BY MOUTH EVERY DAY  . lisinopril (PRINIVIL,ZESTRIL) 10 MG tablet Take 10 mg by mouth daily.  . metoprolol (LOPRESSOR) 50 MG tablet TAKE ONE TABLET TWICE DAILY  . mirtazapine (REMERON) 15 MG tablet TAKE ONE (1) TABLET EACH DAY  . nabumetone (RELAFEN) 500 MG tablet TAKE ONE TABLET TWICE DAILY AS NEEDED  . zolpidem (AMBIEN) 10 MG tablet TAKE ONE (1) TABLET AT BEDTIME  . aspirin EC 81 MG tablet Take 1 tablet (81 mg total) by mouth daily. (Patient not taking: Reported on 01/06/2016)   No current facility-administered medications on file prior to visit.   Allergies  Allergen Reactions  . Influenza Vaccines Other (See Comments)    Guillain Barre  . Declomycin [Demeclocycline]   . Demeclocycline Hcl   . Ginger   . Remicade [Infliximab]   . Suvorexant Other (See Comments)    vision changes   Past Surgical History  Procedure Laterality Date  . Thyroidectomy    . Meniscus repair Right   . Cardiac catheterization  06/07/2014    [facility]   Social History   Social History  . Marital Status: Divorced    Spouse Name: N/A  . Number of Children: N/A  . Years of Education: N/A   Occupational History  . Not on file.   Social History Main Topics  . Smoking  status: Never Smoker   . Smokeless tobacco: Never Used  . Alcohol Use: No  . Drug Use: No  . Sexual Activity: Not on file   Other Topics Concern  . Not on file   Social History Narrative   Family History  Problem Relation Age of Onset  . Hyperlipidemia Mother   . Heart attack Father 20  . Hypertension Father   . Hyperlipidemia Father   . Heart failure Father        Objective:   Physical Exam  Constitutional: He is oriented to person, place, and time. He appears well-developed and well-nourished.  Neurological: He is alert and oriented to person, place, and time.  Psychiatric: He has a normal mood and affect. His behavior is normal. Judgment and thought content normal.   BP 140/82 mmHg  Pulse 64  Temp(Src) 98.2 F (36.8 C) (Oral)  Resp 16  Wt 231 lb (104.781 kg)      Assessment & Plan:  1. Type 2 diabetes mellitus without complication, without long-term current use of insulin (HCC) Stable. Continue to eat healthy and stay as active as tolerated. Recheck in 3 months.   - POCT glycosylated hemoglobin (Hb  A1C) - TSH  2. Psoriatic arthritis ([place]) Stable. Continue current medication. Has not history of abuse. Can not tolerate other medication secondary to history of Guillain  Barre.  - HYDROcodone-acetaminophen (NORCO/VICODIN) 5-325 MG tablet; Take 1 tablet by mouth 2 (two) times daily as needed for moderate pain.  Dispense: 60 tablet; Refill: 0 - fentaNYL (DURAGESIC - DOSED MCG/HR) 50 MCG/HR; Place 1 patch (50 mcg total) onto the skin every other day. To be filled after 04/06/16  Dispense: 15 patch; Refill: 0  3. Essential hypertension Condition is stable. Please continue current medication and  plan of care as noted.    4. Cannot sleep Unchanged.    5. Hyperlipemia Condition is stable. Please continue current medication and  plan of care as noted.  Will check labs.   - Lipid panel - atorvastatin (LIPITOR) 10 MG tablet; Take 1 tablet (10 mg total) by mouth every  evening.  Dispense: 90 tablet; Refill: 3  6. Other hypoparathyroidism ([place]) [place] labs. Stable.   - Comprehensive Metabolic Panel (CMET)  7. Hypothyroidism, unspecified hypothyroidism type Will check labs.  - CBC - levothyroxine (SYNTHROID, LEVOTHROID) 137 MCG tablet; Take 1 tablet (137 mcg total) by mouth daily.  Dispense: 90 tablet; Refill: 3  8. GUILLAIN-BARRE SYNDROME History of Guillain-Barre.    Patient was seen and examined by Jerrell Belfast, MD, and note scribed by Renaldo Fiddler, CMA. I have reviewed the document for accuracy and completeness and I agree with above. Jerrell Belfast, MD   Margarita Rana, MD   Margarita Rana, MD

## 2016-01-11 LAB — COMPREHENSIVE METABOLIC PANEL
ALBUMIN: 4.4 g/dL (ref 3.5–5.5)
ALK PHOS: 100 IU/L (ref 39–117)
ALT: 27 IU/L (ref 0–44)
AST: 23 IU/L (ref 0–40)
Albumin/Globulin Ratio: 1.6 (ref 1.2–2.2)
BILIRUBIN TOTAL: 0.3 mg/dL (ref 0.0–1.2)
BUN/Creatinine Ratio: 16 (ref 9–20)
BUN: 16 mg/dL (ref 6–24)
CHLORIDE: 95 mmol/L — AB (ref 96–106)
CO2: 26 mmol/L (ref 18–29)
Calcium: 8.5 mg/dL — ABNORMAL LOW (ref 8.7–10.2)
Creatinine, Ser: 1.01 mg/dL (ref 0.76–1.27)
GFR calc Af Amer: 95 mL/min/{1.73_m2} (ref >59)
GFR calc non Af Amer: 82 mL/min/{1.73_m2} (ref >59)
GLUCOSE: 118 mg/dL — AB (ref 65–99)
Globulin, Total: 2.8 g/dL (ref 1.5–4.5)
Potassium: 4.5 mmol/L (ref 3.5–5.2)
SODIUM: 138 mmol/L (ref 134–144)
Total Protein: 7.2 g/dL (ref 6.0–8.5)

## 2016-01-11 LAB — CBC
Hematocrit: 38.8 % (ref 37.5–51.0)
Hemoglobin: 13.3 g/dL (ref 12.6–17.7)
MCH: 29.8 pg (ref 26.6–33.0)
MCHC: 34.3 g/dL (ref 31.5–35.7)
MCV: 87 fL (ref 79–97)
Platelets: 265 10*3/uL (ref 150–379)
RBC: 4.46 x10E6/uL (ref 4.14–5.80)
RDW: 12.7 % (ref 12.3–15.4)
WBC: 7.3 10*3/uL (ref 3.4–10.8)

## 2016-01-11 LAB — LIPID PANEL
Chol/HDL Ratio: 4 ratio units (ref 0.0–5.0)
Cholesterol, Total: 168 mg/dL (ref 100–199)
HDL: 42 mg/dL (ref >39)
LDL CALC: 103 mg/dL — AB (ref 0–99)
Triglycerides: 116 mg/dL (ref 0–149)
VLDL CHOLESTEROL CAL: 23 mg/dL (ref 5–40)

## 2016-01-11 LAB — TSH: TSH: 4.46 u[IU]/mL (ref 0.450–4.500)

## 2016-01-13 ENCOUNTER — Telehealth: Payer: Self-pay

## 2016-01-13 NOTE — Telephone Encounter (Signed)
LMTCB Hiyab Nhem Drozdowski, CMA  

## 2016-01-13 NOTE — Telephone Encounter (Signed)
-----   Message from Margarita Rana, MD sent at 01/11/2016  1:46 PM EDT ----- Labs stable except for low calcium.  Please clarify if taking supplement. Thanks.

## 2016-01-17 NOTE — Telephone Encounter (Signed)
Encourage patient to take and use stool softener or Miralax daily. Thanks.

## 2016-01-17 NOTE — Telephone Encounter (Signed)
Patient advised as directed below. Patient verbalized understanding.  

## 2016-01-17 NOTE — Telephone Encounter (Signed)
Advised patient as below. Patient reports that he does not take the calcium supplement regularly because it makes him constipated.

## 2016-02-04 ENCOUNTER — Other Ambulatory Visit: Payer: Self-pay | Admitting: Family Medicine

## 2016-02-29 ENCOUNTER — Other Ambulatory Visit: Payer: Self-pay | Admitting: Family Medicine

## 2016-02-29 DIAGNOSIS — L4050 Arthropathic psoriasis, unspecified: Secondary | ICD-10-CM

## 2016-03-13 ENCOUNTER — Other Ambulatory Visit: Payer: Self-pay | Admitting: Family Medicine

## 2016-03-13 DIAGNOSIS — L4050 Arthropathic psoriasis, unspecified: Secondary | ICD-10-CM

## 2016-04-17 NOTE — Telephone Encounter (Signed)
error 

## 2016-04-24 ENCOUNTER — Other Ambulatory Visit: Payer: Self-pay

## 2016-04-24 DIAGNOSIS — G4700 Insomnia, unspecified: Secondary | ICD-10-CM

## 2016-04-24 MED ORDER — ZOLPIDEM TARTRATE 10 MG PO TABS: ORAL_TABLET | ORAL | 5 refills | Status: DC

## 2016-04-24 NOTE — Telephone Encounter (Signed)
Rx called in to pharmacy. 

## 2016-04-24 NOTE — Telephone Encounter (Signed)
Please call in zolpidem  

## 2016-05-05 ENCOUNTER — Emergency Department
Admission: EM | Admit: 2016-05-05 | Discharge: 2016-05-05 | Discharge status: Against Medical Advice | Payer: BC Managed Care – PPO | Attending: Student | Admitting: Student

## 2016-05-05 ENCOUNTER — Emergency Department: Payer: BC Managed Care – PPO

## 2016-05-05 ENCOUNTER — Encounter: Payer: Self-pay | Admitting: Emergency Medicine

## 2016-05-05 DIAGNOSIS — Z79899 Other long term (current) drug therapy: Secondary | ICD-10-CM | POA: Diagnosis not present

## 2016-05-05 DIAGNOSIS — E119 Type 2 diabetes mellitus without complications: Secondary | ICD-10-CM | POA: Diagnosis not present

## 2016-05-05 DIAGNOSIS — R079 Chest pain, unspecified: Secondary | ICD-10-CM | POA: Insufficient documentation

## 2016-05-05 DIAGNOSIS — Z7982 Long term (current) use of aspirin: Secondary | ICD-10-CM | POA: Diagnosis not present

## 2016-05-05 DIAGNOSIS — E039 Hypothyroidism, unspecified: Secondary | ICD-10-CM | POA: Insufficient documentation

## 2016-05-05 DIAGNOSIS — R0602 Shortness of breath: Secondary | ICD-10-CM | POA: Diagnosis not present

## 2016-05-05 DIAGNOSIS — I10 Essential (primary) hypertension: Secondary | ICD-10-CM | POA: Diagnosis not present

## 2016-05-05 LAB — BASIC METABOLIC PANEL
ANION GAP: 7 (ref 5–15)
BUN: 18 mg/dL (ref 6–20)
CALCIUM: 8.6 mg/dL — AB (ref 8.9–10.3)
CHLORIDE: 102 mmol/L (ref 101–111)
CO2: 29 mmol/L (ref 22–32)
Creatinine, Ser: 0.8 mg/dL (ref 0.61–1.24)
GFR calc non Af Amer: >60 mL/min (ref >60)
GLUCOSE: 111 mg/dL — AB (ref 65–99)
Potassium: 4.3 mmol/L (ref 3.5–5.1)
Sodium: 138 mmol/L (ref 135–145)

## 2016-05-05 LAB — CBC
HEMATOCRIT: 40.2 % (ref 40.0–52.0)
HEMOGLOBIN: 14.4 g/dL (ref 13.0–18.0)
MCH: 30.5 pg (ref 26.0–34.0)
MCHC: 35.8 g/dL (ref 32.0–36.0)
MCV: 85.3 fL (ref 80.0–100.0)
Platelets: 262 10*3/uL (ref 150–440)
RBC: 4.72 MIL/uL (ref 4.40–5.90)
RDW: 12.8 % (ref 11.5–14.5)
WBC: 7.7 10*3/uL (ref 3.8–10.6)

## 2016-05-05 LAB — TROPONIN I: Troponin I: <0.03 ng/mL (ref <0.03)

## 2016-05-05 MED ORDER — SODIUM CHLORIDE 0.9 % IV BOLUS (SEPSIS): 500.0000 mL | Freq: Once | INTRAVENOUS | Status: DC

## 2016-05-05 NOTE — ED Notes (Signed)
Pt left AMA, pt informed of running a risk of aortic dissection if left without being evaluated. Pt persisted that he understands but still wood liked to leave.

## 2016-05-05 NOTE — ED Provider Notes (Addendum)
[facility] Emergency Department Provider Note   ____________________________________________   First MD Initiated Contact with Patient 05/05/16 2239     (approximate)  I have reviewed the triage vital signs and the nursing notes.   HISTORY  Chief Complaint Chest Pain    HPI Keith Carpenter is a 60 y.o. male with history of Guillain-Barre syndrome, known ascending thoracic aneurysm, psoriasis and psoriatic arthritis, history of hypothyroidism, hypertension, chronic pain, depression, anxiety and panic disorder who presents for evaluation of of one week of  intermittent left-sided chest pain radiating to the left shoulder, gradual onset, modifying factors, severe. He does have some associated shortness of breath but no diaphoresis. No fevers or chills. No vomiting or diarrhea. On chart review he did have a cardiac catheterization done in 2015 which showed normal coronary arteries as well as normal EF.   Past Medical History:  Diagnosis Date  . Colonic constipation   . Diabetes mellitus without complication ([place])   . Dysthymic disorder   . Esophageal reflux   . Essential hypertension   . Gastric ulcer   . Genital disorder, male   . H/O Guillain-Barre syndrome   . Hyperlipidemia   . Hypocalcemia   . Hypothyroidism   . Insomnia   . Nausea   . Panic disorder   . Paresthesia of foot   . Psoriatic arthritis ([place])   . Schamberg disease   . Syncope and collapse   . Thoracic aortic aneurysm (HCC)    4.4 cm followed at [facility]  . Vitamin D deficiency     Patient Active Problem List   Diagnosis Date Noted  . 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 Hypothyroidism 08/12/2015  . H/O disease 07/12/2015  . Calcium deficiency disease 07/12/2015  . Burning sensation of the foot 07/12/2015  . 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 Neurocardiogenic syncope 07/12/2015  . Clinical depression 05/17/2015  . Psoriatic arthritis  ([place]) 03/12/2015  . Aneurysm of thoracic aorta ([place]) 01/28/2015  . Chest pain 06/21/2014  . Thoracic aortic aneurysm ([place])   . Essential hypertension   . Aneurysm of ascending aorta (HCC) 04/18/2014  . Adynamia 04/18/2014  . Personal history of other diseases of the nervous system and sense organs 04/18/2014  . Avitaminosis D 07/15/2009  . Colonic constipation 02/24/2008  . Episodic paroxysmal anxiety disorder 02/22/2008  . RESTLESS LEGS SYNDROME 08/31/2007  . Type 2 diabetes mellitus ([place]) 08/30/2007  . Hyperlipemia 08/30/2007  . PERSISTENT DISORDER INITIATING/MAINTAINING SLEEP 08/30/2007  . 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 Headache 08/30/2007  . Cannot sleep 08/17/2007  . Disorder of male genital organ 07/08/2007  . Acid reflux 11/26/2006  . Gastric ulcer 11/26/2006  . Headache, migraine 11/26/2006  . Depression, neurotic 11/25/2006    Past Surgical History:  Procedure Laterality Date  . CARDIAC CATHETERIZATION  06/07/2014   [facility]  . CARDIAC CATHETERIZATION  2015  . MENISCUS REPAIR Right   . THYROIDECTOMY      Prior to Admission medications   Medication Sig Start Date End Date Taking? Authorizing Provider  amLODipine (NORVASC) 5 MG tablet TAKE ONE (1) TABLET EACH DAY 07/12/15   Margarita Rana, MD  aspirin EC 81 MG tablet Take 1 tablet (81 mg total) by mouth daily. Patient not taking: Reported on 01/06/2016 06/04/14   Wellington Hampshire, MD  atorvastatin (LIPITOR) 10 MG tablet Take 1 tablet (10 mg total) by mouth every evening. 01/06/16   Margarita Rana, MD  CUTIVATE 0.05 % LOTN  APPLY TO AFFECTED AREA TWICE A DAY AS NEEDED FOR RASH 02/04/16   Margarita Rana, MD  DULoxetine (CYMBALTA) 60 MG capsule TAKE ONE CAPSULE BY MOUTH DAILY 05/17/15   Margarita Rana, MD  fentaNYL (DURAGESIC - DOSED MCG/HR) 50 MCG/HR Place 1 patch (50 mcg total) onto the skin every other day. To be filled after 04/06/16 01/06/16   Margarita Rana, MD  gabapentin (NEURONTIN) 600 MG tablet TAKE 1 TABLET EVERY  MORNING, 1 TABLET MIDDAY AND 2 TABLETS AT BEDTIME 03/16/16   Birdie Sons, MD  HYDROcodone-acetaminophen (NORCO/VICODIN) 5-325 MG tablet Take 1 tablet by mouth 2 (two) times daily as needed for moderate pain. 01/06/16   Margarita Rana, MD  levothyroxine (SYNTHROID, LEVOTHROID) 137 MCG tablet Take 1 tablet (137 mcg total) by mouth daily. 01/06/16   Margarita Rana, MD  lisinopril (PRINIVIL,ZESTRIL) 10 MG tablet Take 10 mg by mouth daily.    Historical Provider, MD  metoprolol (LOPRESSOR) 50 MG tablet TAKE ONE TABLET TWICE DAILY 12/06/15   Margarita Rana, MD  mirtazapine (REMERON) 15 MG tablet TAKE ONE (1) TABLET EACH DAY 08/05/15   Margarita Rana, MD  nabumetone (RELAFEN) 500 MG tablet TAKE ONE TABLET TWICE DAILY AS NEEDED 03/02/16   Margarita Rana, MD  zolpidem (AMBIEN) 10 MG tablet TAKE ONE (1) TABLET AT BEDTIME 04/24/16   Birdie Sons, MD    Allergies Influenza vaccines; Declomycin [demeclocycline]; Demeclocycline hcl; Ginger; Remicade [infliximab]; and Suvorexant  Family History  Problem Relation Age of Onset  . Hyperlipidemia Mother   . Heart attack Father 40  . Hypertension Father   . Hyperlipidemia Father   . Heart failure Father     Social History Social History  Substance Use Topics  . Smoking status: Never Smoker  . Smokeless tobacco: Never Used  . Alcohol use No    Review of Systems Constitutional: No fever/chills Eyes: No visual changes. ENT: No sore throat. Cardiovascular: + chest pain. Respiratory: + shortness of breath. Gastrointestinal: No abdominal pain.  No nausea, no vomiting.  No diarrhea.  No constipation. Genitourinary: Negative for dysuria. Musculoskeletal: Negative for back pain. Skin: Negative for rash. Neurological: Negative for headaches, focal weakness or numbness.  10-point ROS otherwise negative.  ____________________________________________   PHYSICAL EXAM:  Vitals:   05/05/16 1834 05/05/16 2127 05/05/16 2200 05/05/16 2230  BP: 135/84 (!)  156/92 (!) 114/96 129/78  Pulse: 69 60 61 63  Resp: 20 11 10 10   Temp: 98.4 F (36.9 C)     TempSrc: Oral     SpO2: 93% 96% 95% 96%  Weight: 225 lb (102.1 kg)     Height: 5\' 11"  (1.803 m)       VITAL SIGNS: ED Triage Vitals [05/05/16 1834]  Enc Vitals Group     BP 135/84     Pulse Rate 69     Resp 20     Temp 98.4 F (36.9 C)     Temp Source Oral     SpO2 93 %     Weight 225 lb (102.1 kg)     Height 5\' 11"  (1.803 m)     Head Circumference      Peak Flow      Pain Score 7     Pain Loc      Pain Edu?      Excl. in [place]?     Constitutional: Alert and oriented. Well appearing and in no acute distress. Eyes: Conjunctivae are normal. Extra-ocular muscles appear intact. Head: Atraumatic. Nose: No congestion/rhinnorhea.  Mouth/Throat: Mucous membranes are moist.   Neck: No stridor. Supple without meningismus.  Cardiovascular: Normal rate, regular rhythm. Respiratory: Normal respiratory effort.  No retractions.  Gastrointestinal: No obvious distention.  Genitourinary: deferred Musculoskeletal: No lower extremity edema. Neurologic:  Normal speech and language. Moves All extremities spontaneously and equally. No gait instability. Skin:  Skin is warm and intact. No rash noted. Psychiatric: Mood and affect are normal. Speech and behavior are normal.  ____________________________________________   LABS (all labs ordered are listed, but only abnormal results are displayed)  Labs Reviewed  BASIC METABOLIC PANEL - Abnormal; Notable for the following:       Result Value   Glucose, Bld 111 (*)    Calcium 8.6 (*)    All other components within normal limits  CBC  TROPONIN I   ____________________________________________  EKG  ED ECG REPORT I, Joanne Gavel, the attending physician, personally viewed and interpreted this ECG.   Date: 05/05/2016  EKG Time: 18:30  Rate: 70  Rhythm: sinus rhythm with 1st degree av block  Axis: normal  Intervals:right bundle branch block,  incomplete  ST&T Change: No acute ST elevation or acute ST depression. T-wave inversions in lead V3.  ____________________________________________  RADIOLOGY  CXR IMPRESSION:  Normal chest radiographs.      ____________________________________________   PROCEDURES  Procedure(s) performed: None  Procedures  Critical Care performed: No  ____________________________________________   INITIAL IMPRESSION / ASSESSMENT AND PLAN / ED COURSE  Pertinent labs & imaging results that were available during my care of the patient were reviewed by me and considered in my medical decision making (see chart for details).  Keith Carpenter is a 60 y.o. male with history of Guillain-Barre syndrome, known ascending thoracic aneurysm, psoriasis and psoriatic arthritis, history of hypothyroidism, hypertension, chronic pain, depression, anxiety and panic disorder who presents for evaluation of one week of intermittent left-sided chest pain radiating to the left shoulder. He refused the bulk of the examination, not allowing me to touch him but on observation, he is generally well-appearing and in no acute distress. His vitals signs are stable and he is afebrile. He is sitting at the edge of the bed and appears comfortable but informs me that he wants to leave immediately, he is requesting to leave [place]. I discussed with him that I am very concerned given his chest pain in the setting of known ascending aortic aneurysm. He says to me "it's not your fault, I've just been here waiting for too long. Please unhook me from the monitor and as soon as I can sign the papers, I will sign myself out. I need to go now". He will not allow me to talk to him any longer though I have tried to tell him that the remaining work-up should not take much longer as is chest x-ray is reassuring and his initial set of cardiac enzymes are negative. He repeatedly interrupts me and puts his right hand up to  attempt to silence anymore discussion on the matter. I did tell him that, at this point, we have not ruled out an acute life-threatening cause of his chest pain which includes a heart attack, heart failure or acute aortic dissection. I discussed with him that he is leaving [place], and that my  recommendation is that he stay, at minimum, for second troponin as well as a CT angio chest to rule out a dissection. I discussed with him that leaving [place] puts him at increased risk  for heart attack, worsening pain, heart failure, sudden death, neurologic deficits if he is having a dissection, loss of current lifestyle. He voices understanding of this, refuses to have anymore conversation with me. He has capacity to make this decision and to refuse any additional treatment or testing. He has signed AMA paperwork and refused to stay for printed end-of-visit instructions. I encouraged him to return at any time should he change his mind or develop new symptoms and told him that we would be happy to treat him if he returns.  Clinical Course     ____________________________________________   FINAL CLINICAL IMPRESSION(S) / ED DIAGNOSES  Final diagnoses:  Chest pain, unspecified chest pain type      NEW MEDICATIONS STARTED DURING THIS VISIT:  Discharge Medication List as of 05/05/2016 11:33 PM       Note:  This document was prepared using Dragon voice recognition software and may include unintentional dictation errors.    Joanne Gavel, MD 05/06/16 [id number]    Joanne Gavel, MD 05/08/16 [phone]

## 2016-05-05 NOTE — ED Triage Notes (Signed)
Pt reports chest pain x1 week, reports pain has gotten increasingly worse over the past week. Left sided chest pain with radiation into left shoulder, reports shortness of breath. Pt reports aortic aneurysm, has echo schedule at [facility] this week.

## 2016-05-06 ENCOUNTER — Telehealth: Payer: Self-pay

## 2016-05-06 NOTE — Telephone Encounter (Signed)
Called patient to make ED fu appointment Pt stated he was not interested in making an appointment

## 2016-05-07 ENCOUNTER — Ambulatory Visit: Payer: BC Managed Care – PPO | Admitting: Family Medicine

## 2016-05-11 ENCOUNTER — Ambulatory Visit (INDEPENDENT_AMBULATORY_CARE_PROVIDER_SITE_OTHER): Payer: BC Managed Care – PPO | Admitting: Family Medicine

## 2016-05-11 ENCOUNTER — Encounter: Payer: Self-pay | Admitting: Family Medicine

## 2016-05-11 VITALS — BP 138/76 | HR 60 | Temp 97.9°F | Resp 16 | Wt 236.0 lb

## 2016-05-11 DIAGNOSIS — E119 Type 2 diabetes mellitus without complications: Secondary | ICD-10-CM | POA: Diagnosis not present

## 2016-05-11 DIAGNOSIS — L4050 Arthropathic psoriasis, unspecified: Secondary | ICD-10-CM | POA: Diagnosis not present

## 2016-05-11 DIAGNOSIS — I10 Essential (primary) hypertension: Secondary | ICD-10-CM

## 2016-05-11 DIAGNOSIS — E039 Hypothyroidism, unspecified: Secondary | ICD-10-CM

## 2016-05-11 DIAGNOSIS — E785 Hyperlipidemia, unspecified: Secondary | ICD-10-CM

## 2016-05-11 DIAGNOSIS — G4700 Insomnia, unspecified: Secondary | ICD-10-CM | POA: Diagnosis not present

## 2016-05-11 LAB — POCT GLYCOSYLATED HEMOGLOBIN (HGB A1C): HEMOGLOBIN A1C: 7.8

## 2016-05-11 MED ORDER — FENTANYL 50 MCG/HR TD PT72: 50.0000 ug | MEDICATED_PATCH | TRANSDERMAL | 0 refills | Status: DC

## 2016-05-11 MED ORDER — ZOLPIDEM TARTRATE 10 MG PO TABS: ORAL_TABLET | ORAL | 5 refills | Status: DC

## 2016-05-11 MED ORDER — METOPROLOL TARTRATE 50 MG PO TABS: 50.0000 mg | ORAL_TABLET | Freq: Two times a day (BID) | ORAL | 12 refills | Status: DC

## 2016-05-11 NOTE — Progress Notes (Signed)
Patient: Keith Carpenter Male    DOB: 02-May-1956   60 y.o.   MRN: [id number] Visit Date: 05/11/2016  Today's Provider: Lelon Huh, MD   Chief Complaint  Patient presents with  . Hypertension  . Hyperlipidemia  . Hypothyroidism  . Diabetes  . Arthritis    Psoriatic arthritis   Subjective:     This is a previous patient of Dr. Venia Minks present today as new patient to me to establish care and follow up on chronic medical problems.   Arthritis  Presents for follow-up (Pt has a history of Psoriatic arthritis.) visit. He complains of pain, stiffness and joint swelling (Especially in his hands and feet.). The symptoms have been worsening. Affected location: Worse bilateral hands and feet.  Insomnia  Primary symptoms: sleep disturbance (Pt is unable to sleep. ).  The problem has been gradually worsening (Pt reports having a hard time falling asleep; even with Ambien.  Pt reports his "mind does not shut down.") since onset. How many beverages per day that contain caffeine: 0 - 1 (Does not drink caffiene).  Past treatments include meditation. The treatment provided no relief. Typical bedtime:  8-10 P.M..  How long after going to bed to you fall asleep: over an hour.    He has tried several medications for sleep in the past including multiple different benzodiazepines, and Belsomra which either didn't work or he did not tolerate. He states he will occasional take a second Ambien which usually works well and feel better rested the next morning with no adverse or lingering effects.    Diabetes Mellitus Type II, Follow-up:   Lab Results  Component Value Date   HGBA1C 7.0 01/06/2016   HGBA1C 7.6 (A) 12/05/2014   HGBA1C 6.5 (H) 02/23/2012   Last seen for diabetes 4 months ago.  Management since then includes None. He reports excellent compliance with treatment. He is not having side effects.  Current symptoms include none and have been stable. Home blood sugar records:  Generally runs in the low 100's until about two weeks now it has been running in the upper 100's to 200's.   Episodes of hypoglycemia? Yes; pt reports having "an episode about two weeks ago."   Most Recent Eye Exam: 08/2015 Weight trend: stable Prior visit with dietician: no Current diet: in general, a "healthy" diet   Current exercise: walking  ------------------------------------------------------------------------   Hypertension, follow-up:  BP Readings from Last 3 Encounters:  05/05/16 129/78  01/06/16 140/82  07/23/15 100/64    He was last seen for hypertension 4 months ago.  BP at that visit was 140/82. Management since that visit includes None.He reports excellent compliance with treatment. He is not having side effects.  He is not exercising. He is adherent to low salt diet.   Outside blood pressures are normal. He is experiencing none.  Patient denies chest pain, irregular heart beat, palpitations and syncope.   Cardiovascular risk factors include advanced age (older than 43 for men, 59 for women), diabetes mellitus, dyslipidemia, hypertension and male gender.   ------------------------------------------------------------------------    Lipid/Cholesterol, Follow-up:   Last seen for this 4 months ago.  Management since that visit includes None.  Last Lipid Panel:    Component Value Date/Time   CHOL 168 01/10/2016 0815   CHOL 220 (H) 02/23/2012 0416   TRIG 116 01/10/2016 0815   TRIG 104 02/23/2012 0416   HDL 42 01/10/2016 0815   HDL 36 (L) 02/23/2012 [id number]  CHOLHDL 4.0 01/10/2016 0815   VLDL 21 02/23/2012 0416   LDLCALC 103 (H) 01/10/2016 0815   LDLCALC 163 (H) 02/23/2012 0416    He reports excellent compliance with treatment. He is not having side effects.   Wt Readings from Last 3 Encounters:  05/05/16 225 lb (102.1 kg)  01/06/16 231 lb (104.8 kg)  07/23/15 219 lb 12 oz (99.7 kg)     ------------------------------------------------------------------------      Allergies  Allergen Reactions  . Influenza Vaccines Other (See Comments)    Guillain Barre  . Declomycin [Demeclocycline]   . Demeclocycline Hcl   . Ginger   . Remicade [Infliximab]   . Suvorexant Other (See Comments)    vision changes   Current Meds  Medication Sig  . amLODipine (NORVASC) 5 MG tablet TAKE ONE (1) TABLET EACH DAY  . aspirin EC 81 MG tablet Take 1 tablet (81 mg total) by mouth daily.  Marland Kitchen atorvastatin (LIPITOR) 10 MG tablet Take 1 tablet (10 mg total) by mouth every evening.  Marland Kitchen CUTIVATE 0.05 % LOTN APPLY TO AFFECTED AREA TWICE A DAY AS NEEDED FOR RASH  . DULoxetine (CYMBALTA) 60 MG capsule TAKE ONE CAPSULE BY MOUTH DAILY  . fentaNYL (DURAGESIC - DOSED MCG/HR) 50 MCG/HR Place 1 patch (50 mcg total) onto the skin every other day. To be filled after 10/22//17  . gabapentin (NEURONTIN) 600 MG tablet TAKE 1 TABLET EVERY MORNING, 1 TABLET MIDDAY AND 2 TABLETS AT BEDTIME  . HYDROcodone-acetaminophen (NORCO/VICODIN) 5-325 MG tablet Take 1 tablet by mouth 2 (two) times daily as needed for moderate pain.  Marland Kitchen levothyroxine (SYNTHROID, LEVOTHROID) 137 MCG tablet Take 1 tablet (137 mcg total) by mouth daily.  Marland Kitchen lisinopril (PRINIVIL,ZESTRIL) 10 MG tablet Take 10 mg by mouth daily.  . metoprolol (LOPRESSOR) 50 MG tablet Take 1 tablet (50 mg total) by mouth 2 (two) times daily.  . mirtazapine (REMERON) 15 MG tablet TAKE ONE (1) TABLET EACH DAY  . nabumetone (RELAFEN) 500 MG tablet TAKE ONE TABLET TWICE DAILY AS NEEDED  . zolpidem (AMBIEN) 10 MG tablet 1-2 at bedtime as needed for insomnia    Review of Systems  Constitutional: Negative.   Respiratory: Negative.   Cardiovascular: Negative.        Was in the ER last week for chest pains.  Pt reports it has resolved.   Gastrointestinal: Negative.   Endocrine: Negative.   Musculoskeletal: Positive for arthralgias, arthritis, joint swelling (Especially  in his hands and feet.) and stiffness. Negative for back pain, gait problem, myalgias, neck pain and neck stiffness.  Neurological: Negative for dizziness, light-headedness, numbness and headaches.  Psychiatric/Behavioral: Positive for sleep disturbance (Pt is unable to sleep. ). Negative for agitation, confusion, decreased concentration and dysphoric mood. The patient has insomnia. The patient is not nervous/anxious.     Social History  Substance Use Topics  . Smoking status: Never Smoker  . Smokeless tobacco: Never Used  . Alcohol use No   Objective:   BP 138/76 (BP Location: Left Arm, Patient Position: Sitting, Cuff Size: Large)   Pulse 60   Temp 97.9 F (36.6 C) (Oral)   Resp 16   Wt 236 lb (107 kg)   BMI 32.92 kg/m   Physical Exam   General Appearance:    Alert, cooperative, no distress  Eyes:    PERRL, conjunctiva/corneas clear, EOM's intact       Lungs:     Clear to auscultation bilaterally, respirations unlabored  Heart:    Regular rate  and rhythm  Neurologic:   Awake, alert, oriented x 3. No apparent focal neurological           defect.       Results for orders placed or performed in visit on 05/11/16  POCT glycosylated hemoglobin (Hb A1C)  Result Value Ref Range   Hemoglobin A1C 7.8        Assessment & Plan:     1. Essential hypertension Well controlled.  Continue current medications.   - metoprolol (LOPRESSOR) 50 MG tablet; Take 1 tablet (50 mg total) by mouth 2 (two) times daily.  Dispense: 60 tablet; Refill: 12  2. Type 2 diabetes mellitus without complication, without long-term current use of insulin (HCC) Stable. Work on diet to lose weight. Continue current medications.   - POCT glycosylated hemoglobin (Hb A1C)  3. Hypothyroidism, unspecified hypothyroidism type Lab Results  Component Value Date   TSH 4.460 01/10/2016     4. Psoriatic arthritis ([place]) Worsening. Has not seen rheumatology for years.  - Ambulatory referral to Rheumatology -  fentaNYL (DURAGESIC - DOSED MCG/HR) 50 MCG/HR; Place 1 patch (50 mcg total) onto the skin every other day. To be filled after 10/22//17  Dispense: 15 patch; Refill: 0  5. Hyperlipemia He is tolerating atorvastatin well with no adverse effects.    6. Insomnia   7. Cannot sleep 10mg  often but not always effective. Failed several other sleep aids. May take a second ambien occasionally.  - zolpidem (AMBIEN) 10 MG tablet; 1-2 at bedtime as needed for insomnia  Dispense: 30 tablet; Refill: 5  Return in about 3 months (around 08/11/2016).        Lelon Huh, MD  [facility]

## 2016-07-10 ENCOUNTER — Other Ambulatory Visit: Payer: Self-pay | Admitting: Family Medicine

## 2016-07-10 NOTE — Telephone Encounter (Signed)
last ov 05/11/16

## 2016-07-28 ENCOUNTER — Other Ambulatory Visit: Payer: Self-pay | Admitting: *Deleted

## 2016-07-28 DIAGNOSIS — L4050 Arthropathic psoriasis, unspecified: Secondary | ICD-10-CM

## 2016-07-28 MED ORDER — FENTANYL 50 MCG/HR TD PT72: 50.0000 ug | MEDICATED_PATCH | TRANSDERMAL | 0 refills | Status: DC

## 2016-07-29 ENCOUNTER — Other Ambulatory Visit: Payer: Self-pay | Admitting: Family Medicine

## 2016-07-29 DIAGNOSIS — L4050 Arthropathic psoriasis, unspecified: Secondary | ICD-10-CM

## 2016-07-29 MED ORDER — FENTANYL 50 MCG/HR TD PT72: 50.0000 ug | MEDICATED_PATCH | TRANSDERMAL | 0 refills | Status: DC

## 2016-08-12 ENCOUNTER — Ambulatory Visit: Payer: BC Managed Care – PPO | Admitting: Family Medicine

## 2016-08-18 ENCOUNTER — Ambulatory Visit (INDEPENDENT_AMBULATORY_CARE_PROVIDER_SITE_OTHER): Payer: BC Managed Care – PPO | Admitting: Family Medicine

## 2016-08-18 VITALS — BP 128/78 | HR 64 | Temp 99.2°F | Resp 16 | Wt 229.0 lb

## 2016-08-18 DIAGNOSIS — L4050 Arthropathic psoriasis, unspecified: Secondary | ICD-10-CM

## 2016-08-18 DIAGNOSIS — E119 Type 2 diabetes mellitus without complications: Secondary | ICD-10-CM | POA: Diagnosis not present

## 2016-08-18 DIAGNOSIS — N50812 Left testicular pain: Secondary | ICD-10-CM | POA: Diagnosis not present

## 2016-08-18 DIAGNOSIS — G4700 Insomnia, unspecified: Secondary | ICD-10-CM

## 2016-08-18 LAB — POCT GLYCOSYLATED HEMOGLOBIN (HGB A1C)
ESTIMATED AVERAGE GLUCOSE: 183
Hemoglobin A1C: 8

## 2016-08-18 MED ORDER — PIOGLITAZONE HCL 15 MG PO TABS: 15.0000 mg | ORAL_TABLET | Freq: Every day | ORAL | 5 refills | Status: DC

## 2016-08-18 MED ORDER — NABUMETONE 500 MG PO TABS: ORAL_TABLET | ORAL | 5 refills | Status: DC

## 2016-08-18 MED ORDER — ZOLPIDEM TARTRATE 10 MG PO TABS: ORAL_TABLET | ORAL | 5 refills | Status: DC

## 2016-08-18 NOTE — Progress Notes (Signed)
Patient: Keith Carpenter Male    DOB: [birth year]   60 y.o.   MRN: [id number] Visit Date: 08/18/2016  Today's Provider: Lelon Huh, MD   Chief Complaint  Patient presents with  . Follow-up  . Diabetes  . Hypertension  . Hyperlipidemia  . Insomnia  . Hypothyroidism   Subjective:    HPI   Diabetes Mellitus Type II, Follow-up:   Lab Results  Component Value Date   HGBA1C 8.0 08/18/2016   HGBA1C 7.8 05/11/2016   HGBA1C 7.0 01/06/2016   Last seen for diabetes 3 months ago.  Management since then includes diet and exercise. He reports good compliance with treatment. He is not having side effects.  Current symptoms include none and have been stable. Home blood sugar records: fasting range: 116 this morning.   Episodes of hypoglycemia? no   Current Insulin Regimen: none Most Recent Eye Exam: due Weight trend: stable Prior visit with dietician: no Current diet: well balanced Current exercise: walking    Hypertension, follow-up:  BP Readings from Last 3 Encounters:  08/18/16 128/78  05/11/16 138/76  05/05/16 129/78    He was last seen for hypertension 3 months ago.  BP at that visit was 138/76. Management since that visit includes no changes.He reports good compliance with treatment. He is not having side effects.  He is exercising. He is adherent to low salt diet.   Outside blood pressures are checked occasionally. Patient denies chest pain, exertional chest pressure/discomfort, palpitations and tachypnea.   Cardiovascular risk factors include diabetes mellitus and dyslipidemia.     Lipid/Cholesterol, Follow-up:   Last seen for this 3 months ago.  Management since that visit includes no changes.   Last Lipid Panel:    Component Value Date/Time   CHOL 168 01/10/2016 0815   CHOL 220 (H) 02/23/2012 0416   TRIG 116 01/10/2016 0815   TRIG 104 02/23/2012 0416   HDL 42 01/10/2016 0815   HDL 36 (L) 02/23/2012 0416   CHOLHDL 4.0 01/10/2016  0815   VLDL 21 02/23/2012 0416   LDLCALC 103 (H) 01/10/2016 0815   LDLCALC 163 (H) 02/23/2012 0416    He reports good compliance with treatment. He is not having side effects.   Wt Readings from Last 3 Encounters:  08/18/16 229 lb (103.9 kg)  05/11/16 236 lb (107 kg)  05/05/16 225 lb (102.1 kg)    Insomnia, follow up: Patient was last seen for this 3 months ago. Patient reports that he was instructed to take an additional dose of Ambien on a "bad night". Patient reports that he has not had to take an additional dose since last OV. He does report that he spoke with the pharmacist, and she told him that he would need a new prescription written so he would not run out too early.   He also states he has been having intermittent episodes of pain and swelling in his left testicle for a couple of months. Usually last 2-3 day and improves spontaneously.    Allergies  Allergen Reactions  . Influenza Vaccines Other (See Comments)    Guillain Barre  . Declomycin [Demeclocycline]   . Demeclocycline Hcl   . Ginger   . Remicade [Infliximab]   . Suvorexant Other (See Comments)    vision changes     Current Outpatient Prescriptions:  .  amLODipine (NORVASC) 5 MG tablet, TAKE ONE (1) TABLET EACH DAY, Disp: 30 tablet, Rfl: 5 .  aspirin EC 81 MG tablet,  Take 1 tablet (81 mg total) by mouth daily., Disp: 90 tablet, Rfl: 3 .  atorvastatin (LIPITOR) 10 MG tablet, Take 1 tablet (10 mg total) by mouth every evening., Disp: 90 tablet, Rfl: 3 .  CUTIVATE 0.05 % LOTN, APPLY TO AFFECTED AREA TWICE A DAY AS NEEDED FOR RASH, Disp: 120 mL, Rfl: 5 .  DULoxetine (CYMBALTA) 60 MG capsule, TAKE ONE CAPSULE BY MOUTH DAILY, Disp: 90 capsule, Rfl: 3 .  fentaNYL (DURAGESIC - DOSED MCG/HR) 50 MCG/HR, Place 1 patch (50 mcg total) onto the skin every other day., Disp: 15 patch, Rfl: 0 .  gabapentin (NEURONTIN) 600 MG tablet, TAKE 1 TABLET EVERY MORNING, 1 TABLET MIDDAY AND 2 TABLETS AT BEDTIME, Disp: 120 tablet, Rfl:  5 .  HYDROcodone-acetaminophen (NORCO/VICODIN) 5-325 MG tablet, Take 1 tablet by mouth 2 (two) times daily as needed for moderate pain., Disp: 60 tablet, Rfl: 0 .  levothyroxine (SYNTHROID, LEVOTHROID) 137 MCG tablet, Take 1 tablet (137 mcg total) by mouth daily., Disp: 90 tablet, Rfl: 3 .  lisinopril (PRINIVIL,ZESTRIL) 10 MG tablet, Take 10 mg by mouth daily., Disp: , Rfl:  .  metoprolol (LOPRESSOR) 50 MG tablet, Take 1 tablet (50 mg total) by mouth 2 (two) times daily., Disp: 60 tablet, Rfl: 12 .  mirtazapine (REMERON) 15 MG tablet, TAKE ONE (1) TABLET EACH DAY, Disp: 30 tablet, Rfl: 5 .  nabumetone (RELAFEN) 500 MG tablet, TAKE ONE TABLET TWICE DAILY AS NEEDED, Disp: 60 tablet, Rfl: 5 .  zolpidem (AMBIEN) 10 MG tablet, 1-2 at bedtime as needed for insomnia, Disp: 30 tablet, Rfl: 5  Review of Systems  Constitutional: Negative for appetite change, chills and fever.  Respiratory: Negative for chest tightness, shortness of breath and wheezing.   Cardiovascular: Negative for chest pain and palpitations.  Gastrointestinal: Negative for abdominal pain, nausea and vomiting.    Social History  Substance Use Topics  . Smoking status: Never Smoker  . Smokeless tobacco: Never Used  . Alcohol use No   Objective:   BP 128/78 (BP Location: Right Arm, Patient Position: Sitting, Cuff Size: Large)   Pulse 64   Temp 99.2 F (37.3 C)   Resp 16   Wt 229 lb (103.9 kg)   BMI 31.94 kg/m   Physical Exam    General Appearance:    Alert, cooperative, no distress  Eyes:    PERRL, conjunctiva/corneas clear, EOM's intact       Lungs:     Clear to auscultation bilaterally, respirations unlabored  Heart:    Regular rate and rhythm  Neurologic:   Awake, alert, oriented x 3. No apparent focal neurological           defect.        Results for orders placed or performed in visit on 08/18/16  POCT glycosylated hemoglobin (Hb A1C)  Result Value Ref Range   Hemoglobin A1C 8.0    Est. average glucose Bld  gHb Est-mCnc 183        Assessment & Plan:     1. Type 2 diabetes mellitus without complication, without long-term current use of insulin (HCC) Has never had specific dietary counseling for diabetes which we will refer him to. He did not tolerated IR metformin due to nausea. Will try low dose of pioglitazone and follow up in 3 months.   - POCT glycosylated hemoglobin (Hb A1C) - Ambulatory referral to Nutrition and Diabetic Education - pioglitazone (ACTOS) 15 MG tablet; Take 1 tablet (15 mg total) by mouth daily.  Dispense: 30 tablet; Refill: 5  2. Left testicular pain New - Ambulatory referral to Urology  3. Insomnia, unspecified type Needs refill to allow occasional second dose of Ambien. Counseled that this does exceed maximum recommended dose and risks of oversedation.  - zolpidem (AMBIEN) 10 MG tablet; 1-2 at bedtime as needed for insomnia  Dispense: 35 tablet; Refill: 5  4. Psoriatic arthritis ([place]) Needs refill Nabumetone.  - nabumetone (RELAFEN) 500 MG tablet; TAKE ONE TABLET TWICE DAILY AS NEEDED  Dispense: 60 tablet; Refill: [place], MD  [facility]

## 2016-08-20 ENCOUNTER — Telehealth: Payer: Self-pay | Admitting: *Deleted

## 2016-08-20 ENCOUNTER — Other Ambulatory Visit: Payer: Self-pay | Admitting: *Deleted

## 2016-08-20 DIAGNOSIS — N50819 Testicular pain, unspecified: Secondary | ICD-10-CM

## 2016-08-20 NOTE — Telephone Encounter (Signed)
Called and spoke to patient about the need to arrive 30 minutes early to his new patient appointment tomorrow in [place], so he can go to the lab before coming to his appointment. Directions given to patient on how to get to the lab and office. Patient ok with plan.

## 2016-08-21 ENCOUNTER — Other Ambulatory Visit: Payer: Self-pay | Admitting: *Deleted

## 2016-08-21 ENCOUNTER — Ambulatory Visit (INDEPENDENT_AMBULATORY_CARE_PROVIDER_SITE_OTHER): Payer: BC Managed Care – PPO | Admitting: Urology

## 2016-08-21 ENCOUNTER — Other Ambulatory Visit
Admission: RE | Admit: 2016-08-21 | Discharge: 2016-08-21 | Disposition: A | Discharge status: Home / Self Care | Payer: BC Managed Care – PPO | Source: Ambulatory Visit | Attending: Urology | Admitting: Urology

## 2016-08-21 ENCOUNTER — Encounter: Payer: Self-pay | Admitting: Urology

## 2016-08-21 VITALS — BP 138/79 | HR 57 | Ht 71.0 in | Wt 230.0 lb

## 2016-08-21 DIAGNOSIS — N401 Enlarged prostate with lower urinary tract symptoms: Secondary | ICD-10-CM | POA: Diagnosis not present

## 2016-08-21 DIAGNOSIS — R109 Unspecified abdominal pain: Secondary | ICD-10-CM

## 2016-08-21 DIAGNOSIS — N50819 Testicular pain, unspecified: Secondary | ICD-10-CM | POA: Diagnosis not present

## 2016-08-21 DIAGNOSIS — R3129 Other microscopic hematuria: Secondary | ICD-10-CM

## 2016-08-21 DIAGNOSIS — N50812 Left testicular pain: Secondary | ICD-10-CM

## 2016-08-21 DIAGNOSIS — N138 Other obstructive and reflux uropathy: Secondary | ICD-10-CM

## 2016-08-21 LAB — URINALYSIS, COMPLETE (UACMP) WITH MICROSCOPIC
Bacteria, UA: NONE SEEN
Bilirubin Urine: NEGATIVE
GLUCOSE, UA: NEGATIVE mg/dL
KETONES UR: NEGATIVE mg/dL
LEUKOCYTES UA: NEGATIVE
NITRITE: NEGATIVE
PH: 5.5 (ref 5.0–8.0)
Protein, ur: NEGATIVE mg/dL
SQUAMOUS EPITHELIAL / LPF: NONE SEEN
Specific Gravity, Urine: 1.02 (ref 1.005–1.030)

## 2016-08-21 LAB — PSA: PSA: 1.14 ng/mL (ref 0.00–4.00)

## 2016-08-21 LAB — CHLAMYDIA/NGC RT PCR (ARMC ONLY)
Chlamydia Tr: NOT DETECTED
N GONORRHOEAE: NOT DETECTED

## 2016-08-21 NOTE — Progress Notes (Signed)
08/21/2016 10:32 AM   Pryor Ochoa 1955-09-16 [id number]  Referring provider: Birdie Sons, MD [address] [place], [place] [address]  Chief Complaint  Patient presents with  . New Patient (Initial Visit)    left testicular pain referred by Dr. Caryn Section    HPI: Patient is a 60 year old Caucasian male who is referred to [place] by Dr. Caryn Section for left testicular pain.  For the last several months, he has been having left testicular pain.  At the end of October, the pain radiated into the left flank.  He states his testicle has been swollen at times.  He describes the pain as an uncomfortable feeling.  He is finding being active is making it worse and applying ice to the testicle seems to help with the pain.  The scrotum has been tender,  but no redness or heat.    He is not having testicular pain today.    He is not sexually active.    He is having nocturia, intermittency, hesitancy, straining to urinate and weak stream.  These urinary symptoms have been present for some time.    He is not having dysuria, gross hematuria or suprapubic pain.  He has not had any recent fevers, chills, nausea or vomiting.    PMH: Past Medical History:  Diagnosis Date  . Anxiety   . Arthritis   . Colonic constipation   . Depression   . Diabetes mellitus without complication ([place])   . Dysthymic disorder   . Esophageal reflux   . Essential hypertension   . Gastric ulcer   . Genital disorder, male   . H/O Guillain-Barre syndrome   . Hyperlipidemia   . Hypocalcemia   . Hypothyroidism   . Insomnia   . Nausea   . Panic disorder   . Paresthesia of foot   . Psoriatic arthritis ([place])   . Schamberg disease   . Syncope and collapse   . Thoracic aortic aneurysm (HCC)    4.4 cm followed at [facility]  . Thyroid cancer ([place])   . Vitamin D deficiency     Surgical History: Past Surgical History:  Procedure Laterality Date  . CARDIAC CATHETERIZATION  06/07/2014   [facility]  . CARDIAC  CATHETERIZATION  2015  . MENISCUS REPAIR Right   . THYROIDECTOMY      Home Medications:    Medication List       Accurate as of 08/21/16 10:32 AM. Always use your most recent med list.          amLODipine 5 MG tablet Commonly known as:  NORVASC TAKE ONE (1) TABLET EACH DAY   aspirin EC 81 MG tablet Take 1 tablet (81 mg total) by mouth daily.   atorvastatin 10 MG tablet Commonly known as:  LIPITOR Take 1 tablet (10 mg total) by mouth every evening.   CUTIVATE 0.05 % Lotn Generic drug:  Fluticasone Propionate APPLY TO AFFECTED AREA TWICE A DAY AS NEEDED FOR RASH   DULoxetine 60 MG capsule Commonly known as:  CYMBALTA TAKE ONE CAPSULE BY MOUTH DAILY   fentaNYL 50 MCG/HR Commonly known as:  DURAGESIC - dosed mcg/hr Place 1 patch (50 mcg total) onto the skin every other day.   gabapentin 600 MG tablet Commonly known as:  NEURONTIN TAKE 1 TABLET EVERY MORNING, 1 TABLET MIDDAY AND 2 TABLETS AT BEDTIME   HYDROcodone-acetaminophen 5-325 MG tablet Commonly known as:  NORCO/VICODIN Take 1 tablet by mouth 2 (two) times daily as  needed for moderate pain.   levothyroxine 137 MCG tablet Commonly known as:  SYNTHROID, LEVOTHROID Take 1 tablet (137 mcg total) by mouth daily.   lisinopril 10 MG tablet Commonly known as:  PRINIVIL,ZESTRIL Take 10 mg by mouth daily.   metoprolol 50 MG tablet Commonly known as:  LOPRESSOR Take 1 tablet (50 mg total) by mouth 2 (two) times daily.   mirtazapine 15 MG tablet Commonly known as:  REMERON TAKE ONE (1) TABLET EACH DAY   nabumetone 500 MG tablet Commonly known as:  RELAFEN TAKE ONE TABLET TWICE DAILY AS NEEDED   pioglitazone 15 MG tablet Commonly known as:  ACTOS Take 1 tablet (15 mg total) by mouth daily.   zolpidem 10 MG tablet Commonly known as:  AMBIEN 1-2 at bedtime as needed for insomnia       Allergies:  Allergies  Allergen Reactions  . Influenza Vaccines Other (See Comments)    Guillain Barre  . Declomycin  [Demeclocycline]   . Demeclocycline Hcl   . Ginger   . Metformin And Related Nausea Only    Metformin IR 500 BID  . Remicade [Infliximab]   . Suvorexant Other (See Comments)    vision changes    Family History: Family History  Problem Relation Age of Onset  . Hyperlipidemia Mother   . Heart attack Father 63  . Hypertension Father   . Hyperlipidemia Father   . Heart failure Father   . Prostate cancer Neg Hx   . Kidney cancer Neg Hx   . Bladder Cancer Neg Hx   . Kidney disease Neg Hx     Social History:  reports that he has never smoked. He has never used smokeless tobacco. He reports that he does not drink alcohol or use drugs.  ROS: UROLOGY Frequent Urination?: No Hard to postpone urination?: No Burning/pain with urination?: No Get up at night to urinate?: Yes Leakage of urine?: No Urine stream starts and stops?: Yes Trouble starting stream?: Yes Do you have to strain to urinate?: Yes Blood in urine?: No Urinary tract infection?: No Sexually transmitted disease?: No Injury to kidneys or bladder?: No Painful intercourse?: Yes Weak stream?: Yes Erection problems?: Yes Penile pain?: Yes  Gastrointestinal Nausea?: No Vomiting?: No Indigestion/heartburn?: No Diarrhea?: No Constipation?: Yes  Constitutional Fever: No Night sweats?: Yes Weight loss?: No Fatigue?: Yes  Skin Skin rash/lesions?: Yes Itching?: No  Eyes Blurred vision?: No Double vision?: No  Ears/Nose/Throat Sore throat?: No Sinus problems?: No  Hematologic/Lymphatic Swollen glands?: No Easy bruising?: No  Cardiovascular Leg swelling?: No Chest pain?: No  Respiratory Cough?: No Shortness of breath?: No  Endocrine Excessive thirst?: No  Musculoskeletal Back pain?: Yes Joint pain?: Yes  Neurological Headaches?: No Dizziness?: No  Psychologic Depression?: Yes Anxiety?: Yes  Physical Exam: BP 138/79   Pulse (!) 57   Ht 5\' 11"  (1.803 m)   Wt 230 lb (104.3 kg)   BMI  32.08 kg/m   Constitutional: Well nourished. Alert and oriented, No acute distress. HEENT: [place] AT, moist mucus membranes. Trachea midline, no masses. Cardiovascular: No clubbing, cyanosis, or edema. Respiratory: Normal respiratory effort, no increased work of breathing. GI: Abdomen is soft, non tender, non distended, no abdominal masses. Liver and spleen not palpable.  No hernias appreciated.  Stool sample for occult testing is not indicated.   GU: No CVA tenderness.  No bladder fullness or masses.  Patient with circumcised phallus.   Urethral meatus is patent.  No penile discharge. No penile lesions or  rashes. Scrotum without lesions, cysts, rashes and/or edema.  Testicles are located scrotally bilaterally. No masses are appreciated in the testicles. Left and right epididymis are normal. Rectal: Patient with  normal sphincter tone. Anus and perineum without scarring or rashes. No rectal masses are appreciated. Prostate is approximately 45 grams, no nodules are appreciated. Seminal vesicles are normal. Skin: No rashes, bruises or suspicious lesions. Lymph: No cervical or inguinal adenopathy. Neurologic: Grossly intact, no focal deficits, moving all 4 extremities. Psychiatric: Normal mood and affect.  Laboratory Data: Lab Results  Component Value Date   WBC 7.7 05/05/2016   HGB 14.4 05/05/2016   HCT 40.2 05/05/2016   MCV 85.3 05/05/2016   PLT 262 05/05/2016    Lab Results  Component Value Date   CREATININE 0.80 05/05/2016    Lab Results  Component Value Date   HGBA1C 8.0 08/18/2016    Lab Results  Component Value Date   TSH 4.460 01/10/2016       Component Value Date/Time   CHOL 168 01/10/2016 0815   CHOL 220 (H) 02/23/2012 0416   HDL 42 01/10/2016 0815   HDL 36 (L) 02/23/2012 0416   CHOLHDL 4.0 01/10/2016 0815   VLDL 21 02/23/2012 0416   LDLCALC 103 (H) 01/10/2016 0815   LDLCALC 163 (H) 02/23/2012 0416    Lab Results  Component Value Date   AST 23 01/10/2016    Lab Results  Component Value Date   ALT 27 01/10/2016     Urinalysis 0-5 RBC's and 0-5 WBC's.  See EPIC.     Assessment & Plan:    1. Left testicular pain  - patient will undergo a scrotal ultrasound for further evaluation  - patient will return for report  2. Microscopic hematuria  - recheck UA upon return for scrotal ultrasound report   3. Left flank pain  - if scrotal ultrasound does not identify any etiology for his scrotal symptoms, will pursue upper imaging studies as this may be referred pain  4. BPH with LUTS  - PSA drawn today  - will obtain IPSS upon return for scrotal ultrasound report  Return for scrotal ultrasound report.  These notes generated with voice recognition software. I apologize for typographical errors.  Zara Council, [facility] [address] [place],  [address] [phone]

## 2016-08-31 ENCOUNTER — Encounter: Payer: Self-pay | Admitting: Urology

## 2016-09-04 ENCOUNTER — Ambulatory Visit: Payer: BC Managed Care – PPO | Admitting: Urology

## 2016-09-05 ENCOUNTER — Other Ambulatory Visit: Payer: Self-pay | Admitting: Family Medicine

## 2016-09-05 DIAGNOSIS — F32A Depression, unspecified: Secondary | ICD-10-CM

## 2016-09-05 DIAGNOSIS — L4050 Arthropathic psoriasis, unspecified: Secondary | ICD-10-CM

## 2016-09-05 DIAGNOSIS — F329 Major depressive disorder, single episode, unspecified: Secondary | ICD-10-CM

## 2016-09-08 NOTE — Telephone Encounter (Signed)
Last ov 08/18/16; last filled 05/17/15. Please review. Thank you. sd

## 2016-10-05 ENCOUNTER — Other Ambulatory Visit: Payer: Self-pay | Admitting: *Deleted

## 2016-10-05 DIAGNOSIS — L4050 Arthropathic psoriasis, unspecified: Secondary | ICD-10-CM

## 2016-10-05 MED ORDER — FENTANYL 50 MCG/HR TD PT72: 50.0000 ug | MEDICATED_PATCH | TRANSDERMAL | 0 refills | Status: DC

## 2016-10-09 ENCOUNTER — Telehealth: Payer: Self-pay | Admitting: Family Medicine

## 2016-10-09 ENCOUNTER — Other Ambulatory Visit: Payer: Self-pay | Admitting: Family Medicine

## 2016-10-09 DIAGNOSIS — L4050 Arthropathic psoriasis, unspecified: Secondary | ICD-10-CM

## 2016-10-09 MED ORDER — FENTANYL 50 MCG/HR TD PT72: 50.0000 ug | MEDICATED_PATCH | TRANSDERMAL | 0 refills | Status: DC

## 2016-10-09 NOTE — Telephone Encounter (Signed)
error 

## 2016-10-20 ENCOUNTER — Ambulatory Visit: Payer: BC Managed Care – PPO | Admitting: Dietician

## 2016-10-26 ENCOUNTER — Other Ambulatory Visit: Payer: Self-pay | Admitting: *Deleted

## 2016-10-26 DIAGNOSIS — I10 Essential (primary) hypertension: Secondary | ICD-10-CM

## 2016-10-27 MED ORDER — AMLODIPINE BESYLATE 5 MG PO TABS: ORAL_TABLET | ORAL | 6 refills | Status: DC

## 2016-11-09 ENCOUNTER — Encounter: Payer: BC Managed Care – PPO | Attending: Family Medicine | Admitting: Dietician

## 2016-11-09 ENCOUNTER — Encounter: Payer: Self-pay | Admitting: Dietician

## 2016-11-09 VITALS — BP 146/92 | Ht 71.0 in | Wt 230.6 lb

## 2016-11-09 DIAGNOSIS — E119 Type 2 diabetes mellitus without complications: Secondary | ICD-10-CM | POA: Diagnosis not present

## 2016-11-09 DIAGNOSIS — Z713 Dietary counseling and surveillance: Secondary | ICD-10-CM | POA: Diagnosis present

## 2016-11-09 NOTE — Patient Instructions (Signed)
  Check blood sugars 2 x day before breakfast and 2 hrs after supper every day Exercise:  Try exercises in exercise booklet and on DVD if able Avoid sugar sweetened drinks (soda, tea, coffee, sports drinks, juices) Limit intake of sweets and fried foods Eat 3-4 carbohydrate servings/meal + protein Eat 1 carbohydrate serving/snack + protein Eat 3 meals day,   1  snacks a day in afternoon Space meals 4-6 hours apart Make eye doctor appointment Bring blood sugar records to the next appointment/class Get a Sharps container Return for appointment/classes on:  11-16-16

## 2016-11-09 NOTE — Progress Notes (Signed)
Diabetes Self-Management Education  Visit Type: First/Initial  Appt. Start Time:1600 Appt. End Time: 1700  11/09/2016  [name], identified by name and date of birth, is a 61 y.o. male with a diagnosis of Diabetes: Type 2.   ASSESSMENT  Blood pressure (!) 146/92, height 5\' 11"  (1.803 m), weight 230 lb 9.6 oz (104.6 kg). Body mass index is 32.16 kg/m.      Diabetes Self-Management Education - 11/09/16 1707      Visit Information   Visit Type First/Initial     Initial Visit   Diabetes Type Type 2     Health Coping   How would you rate your overall health? Poor     Psychosocial Assessment   Patient Belief/Attitude about Diabetes Motivated to manage diabetes   Self-care barriers None   Patient Concerns Glycemic Control;Weight Control;Healthy Lifestyle  prevent complications   Special Needs None   Preferred Learning Style Auditory;Visual   Learning Readiness Ready   What is the last grade level you completed in school? masters degree     Pre-Education Assessment   Patient understands the diabetes disease and treatment process. Needs Review   Patient undertands incorporating physical activity into lifestyle. Needs Instruction   Patient understands using medications safely. Needs Review   Patient understands monitoring blood glucose, interpreting and using results Needs Review   Patient understands prevention, detection, and treatment of acute complications. Needs Instruction   Patient understands prevention, detection, and treatment of chronic complications. Needs Instruction   Patient understands how to develop strategies to address psychosocial issues. Needs Instruction   Patient understands how to develop strategies to promote health/change behavior. Needs Instruction     Complications   Last HgB A1C per patient/outside source 8 %  08-18-17   How often do you check your blood sugar? 1-2 times/day   Fasting Blood glucose range (mg/dL) 70-129   Have you had a  dilated eye exam in the past 12 months? No  08-2015   Have you had a dental exam in the past 12 months? Yes  10-2016   Are you checking your feet? Yes   How many days per week are you checking your feet? 7     Dietary Intake   Breakfast --  eats 3 meals/day    Snack (morning) --  none   Lunch --  eats lunch at 12p; eats fried foods 2-3x/wk and sweets daily   Snack (afternoon) --  none   Dinner --  eats supper late at 8p   Snack (evening) --  eats snack foods at 10p   Beverage(s) --  drinks water 2-3x/day and unsweetened beverages 4-5x/day     Exercise   Exercise Type ADL's  limited due to arthritis pain in joints, feet and hands     Patient Education   Previous Diabetes Education No   Disease state  Definition of diabetes, type 1 and 2, and the diagnosis of diabetes;Explored patient's options for treatment of their diabetes   Nutrition management  Role of diet in the treatment of diabetes and the relationship between the three main macronutrients and blood glucose level;Food label reading, portion sizes and measuring food.;Carbohydrate counting   Physical activity and exercise  Role of exercise on diabetes management, blood pressure control and cardiac health.;Helped patient identify appropriate exercises in relation to his/her diabetes, diabetes complications and other health issue.   Medications Reviewed patients medication for diabetes, action, purpose, timing of dose and side effects.   Monitoring Yearly dilated eye  exam;Identified appropriate SMBG and/or A1C goals.;Taught/discussed recording of test results and interpretation of SMBG.   Chronic complications Relationship between chronic complications and blood glucose control;Dental care;Retinopathy and reason for yearly dilated eye exams;Reviewed with patient heart disease, higher risk of, and prevention;Lipid levels, blood glucose control and heart disease   Psychosocial adjustment Role of stress on diabetes   Personal  strategies to promote health Helped patient develop diabetes management plan for (enter comment);Lifestyle issues that need to be addressed for better diabetes care      Individualized Plan for Diabetes Self-Management Training:   Learning Objective:  Patient will have a greater understanding of diabetes self-management. Patient education plan is to attend individual and/or group sessions per assessed needs and concerns.   Plan:   Patient Instructions   Check blood sugars 2 x day before breakfast and 2 hrs after supper every day Exercise:  Try exercises in exercise booklet and on DVD if able Avoid sugar sweetened drinks (soda, tea, coffee, sports drinks, juices) Limit intake of sweets and fried foods Eat 3-4 carbohydrate servings/meal + protein Eat 1 carbohydrate serving/snack + protein Eat 3 meals day,   1  snacks a day in afternoon Space meals 4-6 hours apart Make eye doctor appointment Bring blood sugar records to the next appointment/class Get a Sharps container Return for appointment/classes on:  11-16-16   Expected Outcomes:   positive  Education material provided: [employer] guidelines, Work Out To [employer], Exercise DVD If problems or questions, patient to contact team via:  [phone]  Future DSME appointment:  11-16-16

## 2016-11-16 ENCOUNTER — Ambulatory Visit: Payer: BC Managed Care – PPO | Admitting: Family Medicine

## 2016-11-16 ENCOUNTER — Ambulatory Visit: Payer: BC Managed Care – PPO

## 2016-11-17 ENCOUNTER — Encounter: Payer: Self-pay | Admitting: Family Medicine

## 2016-11-17 ENCOUNTER — Telehealth: Payer: Self-pay | Admitting: Dietician

## 2016-11-17 ENCOUNTER — Encounter: Payer: Self-pay | Admitting: Dietician

## 2016-11-17 ENCOUNTER — Ambulatory Visit (INDEPENDENT_AMBULATORY_CARE_PROVIDER_SITE_OTHER): Payer: BC Managed Care – PPO | Admitting: Family Medicine

## 2016-11-17 VITALS — BP 142/64 | HR 60 | Temp 98.8°F | Resp 16 | Wt 227.0 lb

## 2016-11-17 DIAGNOSIS — E119 Type 2 diabetes mellitus without complications: Secondary | ICD-10-CM | POA: Diagnosis not present

## 2016-11-17 DIAGNOSIS — I10 Essential (primary) hypertension: Secondary | ICD-10-CM

## 2016-11-17 LAB — POCT GLYCOSYLATED HEMOGLOBIN (HGB A1C)
Est. average glucose Bld gHb Est-mCnc: 154
HEMOGLOBIN A1C: 7

## 2016-11-17 NOTE — Progress Notes (Signed)
Patient: Keith Carpenter Male    DOB: May 02, 1956   60 y.o.   MRN: [id number] Visit Date: 11/17/2016  Today's Provider: Lelon Huh, MD   Chief Complaint  Patient presents with  . Diabetes  . Hypertension  . Insomnia   Subjective:    HPI  Diabetes Mellitus Type II, Follow-up:   Lab Results  Component Value Date   HGBA1C 8.0 08/18/2016   HGBA1C 7.8 05/11/2016   HGBA1C 7.0 01/06/2016    Last seen for diabetes 3 months ago.  Management since then includes adding low dose Actos 15mg  daily. He reports good compliance with treatment. He is not having side effects.  Current symptoms include none and have been stable. Home blood sugar records: trend: fluctuating a bit Had near syncope episode last week associated with shortness of breath and chest tightness. Developed flu like symptoms over the next couple of days which have mostly resolve. Has since had follow up with Dr. Sammuel Hines.  Episodes of hypoglycemia? no   Current Insulin Regimen: none Most Recent Eye Exam: due Weight trend: stable Prior visit with dietician: Saw nutritionist on 11/11/2016.  Current diet: well balanced Current exercise: walking  Pertinent Labs:    Component Value Date/Time   CHOL 168 01/10/2016 0815   CHOL 220 (H) 02/23/2012 0416   TRIG 116 01/10/2016 0815   TRIG 104 02/23/2012 0416   HDL 42 01/10/2016 0815   HDL 36 (L) 02/23/2012 0416   LDLCALC 103 (H) 01/10/2016 0815   LDLCALC 163 (H) 02/23/2012 0416   CREATININE 0.80 05/05/2016 1831   CREATININE 0.82 02/23/2012 0416    Wt Readings from Last 3 Encounters:  11/17/16 227 lb (103 kg)  11/09/16 230 lb 9.6 oz (104.6 kg)  08/21/16 230 lb (104.3 kg)      Hypertension, follow-up:  BP Readings from Last 3 Encounters:  11/17/16 (!) 142/64  11/09/16 (!) 146/92  08/21/16 138/79    He was last seen for hypertension 3 months ago.  BP at that visit was 138/76. Management since that visit includes no changes. He reports good  compliance with treatment. He is not having side effects.  He is exercising. He is adherent to low salt diet.   Outside blood pressures are checked occasionally. Patient denies chest pressure/discomfort, exertional chest pressure/discomfort and palpitations.   Cardiovascular risk factors include diabetes mellitus.   Weight trend: stable Wt Readings from Last 3 Encounters:  11/17/16 227 lb (103 kg)  11/09/16 230 lb 9.6 oz (104.6 kg)  08/21/16 230 lb (104.3 kg)    Current diet: well balanced      Allergies  Allergen Reactions  . Influenza Vaccines Other (See Comments)    Guillain Barre  . Declomycin [Demeclocycline]   . Demeclocycline Hcl   . Ginger   . Metformin And Related Nausea Only    Metformin IR 500 BID  . Remicade [Infliximab]   . Suvorexant Other (See Comments)    vision changes     Current Outpatient Prescriptions:  .  amLODipine (NORVASC) 5 MG tablet, TAKE ONE (1) TABLET EACH DAY, Disp: 30 tablet, Rfl: 6 .  atorvastatin (LIPITOR) 10 MG tablet, Take 1 tablet (10 mg total) by mouth every evening., Disp: 90 tablet, Rfl: 3 .  CUTIVATE 0.05 % LOTN, APPLY TO AFFECTED AREA TWICE A DAY AS NEEDED FOR RASH, Disp: 120 mL, Rfl: 5 .  DULoxetine (CYMBALTA) 60 MG capsule, TAKE ONE CAPSULE BY MOUTH DAILY, Disp: 90 capsule, Rfl: 4 .  fentaNYL (DURAGESIC - DOSED MCG/HR) 50 MCG/HR, Place 1 patch (50 mcg total) onto the skin every other day., Disp: 15 patch, Rfl: 0 .  gabapentin (NEURONTIN) 600 MG tablet, TAKE 1 TABLET EVERY MORNING, 1 TABLET MIDDAY AND 2 TABLETS AT BEDTIME, Disp: 120 tablet, Rfl: 5 .  HYDROcodone-acetaminophen (NORCO/VICODIN) 5-325 MG tablet, TAKE ONE TABLET TWICE DAILY AS NEEDED FOR MODERATE PAIN, Disp: 60 tablet, Rfl: 0 .  levothyroxine (SYNTHROID, LEVOTHROID) 137 MCG tablet, Take 1 tablet (137 mcg total) by mouth daily., Disp: 90 tablet, Rfl: 3 .  lisinopril (PRINIVIL,ZESTRIL) 10 MG tablet, Take 10 mg by mouth daily., Disp: , Rfl:  .  MELATONIN PO, Take 1-2  tablets by mouth as needed., Disp: , Rfl:  .  metoprolol (LOPRESSOR) 50 MG tablet, Take 1 tablet (50 mg total) by mouth 2 (two) times daily., Disp: 60 tablet, Rfl: 12 .  mirtazapine (REMERON) 15 MG tablet, TAKE ONE (1) TABLET EACH DAY, Disp: 30 tablet, Rfl: 5 .  nabumetone (RELAFEN) 500 MG tablet, Take 2 tablets by mouth daily., Disp: , Rfl:  .  pioglitazone (ACTOS) 15 MG tablet, Take 1 tablet (15 mg total) by mouth daily., Disp: 30 tablet, Rfl: 5 .  zolpidem (AMBIEN) 10 MG tablet, 1-2 at bedtime as needed for insomnia, Disp: 35 tablet, Rfl: 5 .  aspirin EC 81 MG tablet, Take 1 tablet (81 mg total) by mouth daily., Disp: 90 tablet, Rfl: 3  Review of Systems  Constitutional: Negative.   Respiratory: Negative.   Cardiovascular: Positive for leg swelling.  Musculoskeletal: Negative.     Social History  Substance Use Topics  . Smoking status: Never Smoker  . Smokeless tobacco: Never Used  . Alcohol use No   Objective:   BP (!) 142/64 (BP Location: Right Arm, Patient Position: Sitting, Cuff Size: Large)   Pulse 60   Temp 98.8 F (37.1 C)   Resp 16   Wt 227 lb (103 kg)   BMI 31.66 kg/m     Physical Exam   General Appearance:    Alert, cooperative, no distress  Eyes:    PERRL, conjunctiva/corneas clear, EOM's intact       Lungs:     Clear to auscultation bilaterally, respirations unlabored  Heart:    Regular rate and rhythm  Neurologic:   Awake, alert, oriented x 3. No apparent focal neurological           defect.        Results for orders placed or performed in visit on 11/17/16  POCT glycosylated hemoglobin (Hb A1C)  Result Value Ref Range   Hemoglobin A1C 7.0    Est. average glucose Bld gHb Est-mCnc 154        Assessment & Plan:     1. Type 2 diabetes mellitus without complication, without long-term current use of insulin (HCC) Better with addition of pioglitazone. Continue current medications.Return in about 4 months (around 03/19/2017).    - POCT glycosylated  hemoglobin (Hb A1C)  2. Essential hypertension Stable. Continue current medications.    The entirety of the information documented in the History of Present Illness, Review of Systems and Physical Exam were personally obtained by me. Portions of this information were initially documented by Wilburt Finlay, CMA and reviewed by me for thoroughness and accuracy.        Lelon Huh, MD  [facility]

## 2016-11-17 NOTE — Progress Notes (Signed)
Pt called 1 week ago and cancelled classes due to cost

## 2016-11-23 ENCOUNTER — Ambulatory Visit: Payer: BC Managed Care – PPO

## 2016-11-30 ENCOUNTER — Ambulatory Visit: Payer: BC Managed Care – PPO

## 2016-12-28 ENCOUNTER — Other Ambulatory Visit: Payer: Self-pay | Admitting: Family Medicine

## 2016-12-28 NOTE — Telephone Encounter (Signed)
Pt calling stating the medication listed below is needing a prior authorization to get his refill.  Pt called his pharmacy and was told to call his PCP office. Pt states he would like to get this filled. Pt uses [facility].   fentaNYL (DURAGESIC - DOSED MCG/HR) 50 MCG/HR

## 2016-12-28 NOTE — Telephone Encounter (Signed)
PA has been completed

## 2017-01-04 ENCOUNTER — Ambulatory Visit (INDEPENDENT_AMBULATORY_CARE_PROVIDER_SITE_OTHER): Payer: BC Managed Care – PPO | Admitting: Family Medicine

## 2017-01-04 ENCOUNTER — Encounter: Payer: Self-pay | Admitting: Family Medicine

## 2017-01-04 VITALS — BP 120/70 | HR 62 | Temp 99.0°F | Resp 16 | Wt 228.0 lb

## 2017-01-04 DIAGNOSIS — L299 Pruritus, unspecified: Secondary | ICD-10-CM | POA: Diagnosis not present

## 2017-01-04 NOTE — Progress Notes (Signed)
Patient: Keith Carpenter Male    DOB: 06-Aug-1956   61 y.o.   MRN: [id number] Visit Date: 01/04/2017  Today's Provider: Lelon Huh, MD   Chief Complaint  Patient presents with  . Pruritis   Subjective:    Patient stated that he has had itching in his feet, hands and groin since he started taking the medication pioglitazone 15 mg. Patient stated that itching has moved into his face over the weekend. Patient also states that he has had swelling in his feet and ankles since beginning pioglitazone. Patient stated that he has some rash in groin area.   No dyspnea dysphagia or sensationg of throat swelling.   Type 2 diabetes mellitus without complication, without long-term current use of insulin ([facility]) From 11/17/2016-Better with addition of pioglitazone. Continue current medications.Return in about 4 months.  Last A1c 7.0.   Allergies  Allergen Reactions  . Influenza Vaccines Other (See Comments)    Guillain Barre  . Declomycin [Demeclocycline]   . Demeclocycline Hcl   . Ginger   . Metformin And Related Nausea Only    Metformin IR 500 BID  . Remicade [Infliximab]   . Suvorexant Other (See Comments)    vision changes     Current Outpatient Prescriptions:  .  amLODipine (NORVASC) 5 MG tablet, TAKE ONE (1) TABLET EACH DAY, Disp: 30 tablet, Rfl: 6 .  aspirin EC 81 MG tablet, Take 1 tablet (81 mg total) by mouth daily., Disp: 90 tablet, Rfl: 3 .  atorvastatin (LIPITOR) 10 MG tablet, Take 1 tablet (10 mg total) by mouth every evening., Disp: 90 tablet, Rfl: 3 .  CUTIVATE 0.05 % LOTN, APPLY TO AFFECTED AREA TWICE A DAY AS NEEDED FOR RASH, Disp: 120 mL, Rfl: 5 .  DULoxetine (CYMBALTA) 60 MG capsule, TAKE ONE CAPSULE BY MOUTH DAILY, Disp: 90 capsule, Rfl: 4 .  fentaNYL (DURAGESIC - DOSED MCG/HR) 50 MCG/HR, Place 1 patch (50 mcg total) onto the skin every other day., Disp: 15 patch, Rfl: 0 .  gabapentin (NEURONTIN) 600 MG tablet, TAKE 1 TABLET EVERY MORNING, 1 TABLET MIDDAY  AND 2 TABLETS AT BEDTIME, Disp: 120 tablet, Rfl: 5 .  HYDROcodone-acetaminophen (NORCO/VICODIN) 5-325 MG tablet, TAKE ONE TABLET TWICE DAILY AS NEEDED FOR MODERATE PAIN, Disp: 60 tablet, Rfl: 0 .  levothyroxine (SYNTHROID, LEVOTHROID) 137 MCG tablet, Take 1 tablet (137 mcg total) by mouth daily., Disp: 90 tablet, Rfl: 3 .  lisinopril (PRINIVIL,ZESTRIL) 10 MG tablet, Take 10 mg by mouth daily., Disp: , Rfl:  .  MELATONIN PO, Take 1-2 tablets by mouth as needed., Disp: , Rfl:  .  metoprolol (LOPRESSOR) 50 MG tablet, Take 1 tablet (50 mg total) by mouth 2 (two) times daily., Disp: 60 tablet, Rfl: 12 .  mirtazapine (REMERON) 15 MG tablet, TAKE ONE (1) TABLET EACH DAY, Disp: 30 tablet, Rfl: 5 .  nabumetone (RELAFEN) 500 MG tablet, Take 2 tablets by mouth daily., Disp: , Rfl:  .  pioglitazone (ACTOS) 15 MG tablet, Take 1 tablet (15 mg total) by mouth daily., Disp: 30 tablet, Rfl: 5 .  zolpidem (AMBIEN) 10 MG tablet, 1-2 at bedtime as needed for insomnia, Disp: 35 tablet, Rfl: 5  Review of Systems  Constitutional: Negative for appetite change, chills and fever.  Respiratory: Negative for chest tightness, shortness of breath and wheezing.   Cardiovascular: Negative for chest pain and palpitations.  Gastrointestinal: Negative for abdominal pain, nausea and vomiting.  Skin: Positive for rash.    Social  History  Substance Use Topics  . Smoking status: Never Smoker  . Smokeless tobacco: Never Used  . Alcohol use No   Objective:   BP 120/70 (BP Location: Right Arm, Patient Position: Sitting, Cuff Size: Large)   Pulse 62   Temp 99 F (37.2 C) (Oral)   Resp 16   Wt 228 lb (103.4 kg)   SpO2 97%   BMI 31.80 kg/m  Vitals:   01/04/17 1650  BP: 120/70  Pulse: 62  Resp: 16  Temp: 99 F (37.2 C)  TempSrc: Oral  SpO2: 97%  Weight: 228 lb (103.4 kg)     Physical Exam  Urticarial lesions noted with slight swelling of both hands and feet.     Assessment & Plan:      1. Pruritus Suspect  reaction to pioglitazone. Is to stop medication. Consider change to DPP4 inhibitor if rash resolves off of pioglitazone.   The entirety of the information documented in the History of Present Illness, Review of Systems and Physical Exam were personally obtained by me. Portions of this information were initially documented by April M. Sabra Heck, CMA and reviewed by me for thoroughness and accuracy.        Lelon Huh, MD  [facility]

## 2017-01-04 NOTE — Patient Instructions (Signed)
Call back to let me know if itching goes away within 1-2 weeks of stopping pioglitazone. If so we will need to try another medication.

## 2017-01-08 ENCOUNTER — Other Ambulatory Visit: Payer: Self-pay | Admitting: Family Medicine

## 2017-01-12 ENCOUNTER — Telehealth: Payer: Self-pay | Admitting: Family Medicine

## 2017-01-12 DIAGNOSIS — E119 Type 2 diabetes mellitus without complications: Secondary | ICD-10-CM

## 2017-01-12 NOTE — Telephone Encounter (Signed)
Please check with patient to see if rash cleared after stopped pioglitazone. If so then please update allergy list, and start Januvia 100mg  once a day, #30, rf x 3.

## 2017-01-13 NOTE — Telephone Encounter (Signed)
LMOVM for pt to return call 

## 2017-01-14 MED ORDER — SITAGLIPTIN PHOSPHATE 100 MG PO TABS: 100.0000 mg | ORAL_TABLET | Freq: Every day | ORAL | 3 refills | Status: DC

## 2017-01-14 NOTE — Telephone Encounter (Signed)
Pt is returning call.  CB#[phone]/MW °

## 2017-01-14 NOTE — Telephone Encounter (Signed)
Patient stated that rash is still present but is better. Patient stated the swelling and redness is almost gone. Rx was sent to pharmacy.

## 2017-01-18 ENCOUNTER — Telehealth: Payer: Self-pay | Admitting: Family Medicine

## 2017-01-18 NOTE — Telephone Encounter (Signed)
Jody at [employer] called wanitng to know if the PA on pt's   fentaNYL (DURAGESIC - DOSED MCG/HR) 50 MCG/HR has been done.  Please advise.  teri

## 2017-01-19 NOTE — Telephone Encounter (Signed)
Please advise? PA was denied again yesterday.

## 2017-01-22 ENCOUNTER — Ambulatory Visit (INDEPENDENT_AMBULATORY_CARE_PROVIDER_SITE_OTHER): Payer: BC Managed Care – PPO | Admitting: Family Medicine

## 2017-01-22 ENCOUNTER — Telehealth: Payer: Self-pay | Admitting: Family Medicine

## 2017-01-22 ENCOUNTER — Encounter: Payer: Self-pay | Admitting: Family Medicine

## 2017-01-22 VITALS — BP 120/64 | HR 65 | Temp 98.3°F | Resp 16 | Wt 233.0 lb

## 2017-01-22 DIAGNOSIS — G610 Guillain-Barre syndrome: Secondary | ICD-10-CM

## 2017-01-22 DIAGNOSIS — G629 Polyneuropathy, unspecified: Secondary | ICD-10-CM | POA: Insufficient documentation

## 2017-01-22 DIAGNOSIS — L4050 Arthropathic psoriasis, unspecified: Secondary | ICD-10-CM

## 2017-01-22 NOTE — Telephone Encounter (Signed)
This is about fentanyl patch looks like, there is a note about this from yesterday. Please review. Thank you-aa

## 2017-01-22 NOTE — Progress Notes (Signed)
Patient: Keith Carpenter Male    DOB: 1956/05/05   61 y.o.   MRN: [id number] Visit Date: 01/22/2017  Today's Provider: Lelon Huh, MD   Chief Complaint  Patient presents with  . Medication Management   Subjective:    Patient is here to discuss PA for fentayal patch.   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 An appeal letter was sent to [place] today and he was advised we should her back from this in a couple of days. He currently has about 17 days left of Fentanyl.      Allergies  Allergen Reactions  . Influenza Vaccines Other (See Comments)    Guillain Barre  . Declomycin [Demeclocycline]   . Demeclocycline Hcl   . Ginger   . Glipizide     Nausea   . Metformin And Related Nausea Only    Metformin IR 500 BID  . Remicade [Infliximab]   . Suvorexant Other (See Comments)    vision changes  . Pioglitazone Itching, Swelling and Rash     Current Outpatient Prescriptions:  .  amLODipine (NORVASC) 5 MG tablet, TAKE ONE (1) TABLET EACH DAY, Disp: 30 tablet, Rfl: 6 .  aspirin EC 81 MG tablet, Take 1 tablet (81 mg total) by mouth daily., Disp: 90 tablet, Rfl: 3 .  atorvastatin (LIPITOR) 10 MG tablet, Take 1 tablet (10 mg total) by mouth every evening., Disp: 90 tablet, Rfl: 3 .  CUTIVATE 0.05 % LOTN, APPLY TO AFFECTED AREA TWICE A DAY AS NEEDED FOR RASH, Disp: 120 mL, Rfl: 5 .  DULoxetine (CYMBALTA) 60 MG capsule, TAKE ONE CAPSULE BY MOUTH DAILY, Disp: 90 capsule, Rfl: 4 .  fentaNYL (DURAGESIC - DOSED MCG/HR) 50 MCG/HR, Place 1 patch (50 mcg total) onto the skin every other day., Disp: 15 patch, Rfl: 0 .  gabapentin (NEURONTIN) 600 MG tablet, TAKE 1 TABLET EVERY MORNING, 1 TABLET MIDDAY AND 2 TABLETS AT BEDTIME, Disp: 120 tablet, Rfl: 5 .  HYDROcodone-acetaminophen (NORCO/VICODIN) 5-325 MG tablet, TAKE ONE  TABLET TWICE DAILY AS NEEDED FOR MODERATE PAIN, Disp: 60 tablet, Rfl: 0 .  levothyroxine (SYNTHROID, LEVOTHROID) 137 MCG tablet, Take 1 tablet (137 mcg total) by mouth daily., Disp: 90 tablet, Rfl: 3 .  lisinopril (PRINIVIL,ZESTRIL) 10 MG tablet, Take 10 mg by mouth daily., Disp: , Rfl:  .  MELATONIN PO, Take 1-2 tablets by mouth as needed., Disp: , Rfl:  .  metoprolol (LOPRESSOR) 50 MG tablet, Take 1 tablet (50 mg total) by mouth 2 (two) times daily., Disp: 60 tablet, Rfl: 12 .  mirtazapine (REMERON) 15 MG tablet, TAKE ONE (1) TABLET BY MOUTH EVERY DAY, Disp: 30 tablet, Rfl: 12 .  nabumetone (RELAFEN) 500 MG tablet, Take 2 tablets by mouth daily., Disp: , Rfl:  .  sitaGLIPtin (JANUVIA) 100 MG tablet, Take 1 tablet (100 mg total) by mouth daily., Disp: 30 tablet, Rfl: 3 .  zolpidem (AMBIEN) 10 MG tablet, 1-2 at bedtime as needed for insomnia, Disp: 35 tablet, Rfl: 5  Review of Systems  Constitutional: Negative for appetite change, chills and fever.  Respiratory: Negative for chest tightness, shortness of breath and wheezing.   Cardiovascular: Negative for chest pain and palpitations.  Gastrointestinal: Negative for abdominal pain, nausea and vomiting.    Social History  Substance Use Topics  . Smoking status:  Never Smoker  . Smokeless tobacco: Never Used  . Alcohol use No   Objective:   BP 120/64 (BP Location: Right Arm, Patient Position: Sitting, Cuff Size: Large)   Pulse 65   Temp 98.3 F (36.8 C) (Oral)   Resp 16   Wt 233 lb (105.7 kg)   SpO2 93%   BMI 32.50 kg/m  Vitals:   01/22/17 1519  BP: 120/64  Pulse: 65  Resp: 16  Temp: 98.3 F (36.8 C)  TempSrc: Oral  SpO2: 93%  Weight: 233 lb (105.7 kg)     Physical Exam      Assessment & Plan:      Continue to appeal denial for coverage of Fentanyl. Counseled that we will transition to equivielant opioid if appeals are unsuccessful.       Lelon Huh, MD  [facility]

## 2017-01-22 NOTE — Telephone Encounter (Signed)
We have sent in two prior authorizations and they were both denied. We are now in th process of appealing denial.

## 2017-01-22 NOTE — Telephone Encounter (Signed)
Pt calling with concerns about the following medication being denied, pt wants to know what to do about this cause pt has been on this since 2002 and will be needing this medication soon. Please advise pt on what to do about this. Thanks CC

## 2017-01-22 NOTE — Telephone Encounter (Signed)
Patient was notified.

## 2017-01-28 NOTE — Telephone Encounter (Signed)
Called [employer] concerning appeal. [employer] states that the appeal is stilling processing. The appeal can take up to 35 days from the date it was received. Appeal was received on 01/22/2017.

## 2017-01-28 NOTE — Telephone Encounter (Signed)
Appeal letter regarding coverage of Fantanyl patch was sent to [facility] last week. Please check to see if they have processed this. Patient will be out of medication soon is going to go into withdrawals.

## 2017-01-29 ENCOUNTER — Other Ambulatory Visit: Payer: Self-pay | Admitting: Family Medicine

## 2017-01-29 ENCOUNTER — Telehealth: Payer: Self-pay | Admitting: Family Medicine

## 2017-01-29 MED ORDER — LEVOTHYROXINE SODIUM 137 MCG PO TABS: 137.0000 ug | ORAL_TABLET | Freq: Every day | ORAL | 3 refills | Status: DC

## 2017-01-29 NOTE — Telephone Encounter (Signed)
Please advise pharmacist at [facility] that Fentanyl patch was approved by [employer] [facility] [employer] through May 18th, 2019

## 2017-01-29 NOTE — Telephone Encounter (Signed)
-----   Message from Ola Spurr sent at 01/29/2017  2:21 PM EDT ----- Review incoming fax

## 2017-01-29 NOTE — Telephone Encounter (Signed)
Pharmacy was notified.

## 2017-01-29 NOTE — Telephone Encounter (Signed)
Returned call to pt. Notified pt that we are going to call [employer] back to see if we can speed up processing for PA appeal.

## 2017-01-29 NOTE — Telephone Encounter (Signed)
Keith Carpenter called checking on the medication (fentaNYL ) I explained to him the [employer] and it can take up to 35 days from the time it was received.  Pt has concerns about this, pt states he will be running out of this by next week. Please call pt on what to do from here. CB# [phone]. Thanks CC

## 2017-01-29 NOTE — Telephone Encounter (Signed)
[facility] faxed a request on the following medication. Thanks CC  levothyroxine (SYNTHROID, LEVOTHROID) 137 MCG tablet  *Take 1 (one) tablet by mouth every day.

## 2017-02-01 ENCOUNTER — Other Ambulatory Visit: Payer: Self-pay | Admitting: Family Medicine

## 2017-02-01 ENCOUNTER — Other Ambulatory Visit: Payer: Self-pay | Admitting: *Deleted

## 2017-02-01 DIAGNOSIS — L4050 Arthropathic psoriasis, unspecified: Secondary | ICD-10-CM

## 2017-02-01 MED ORDER — FENTANYL 50 MCG/HR TD PT72: 50.0000 ug | MEDICATED_PATCH | TRANSDERMAL | 0 refills | Status: DC

## 2017-02-02 LAB — HM DIABETES EYE EXAM

## 2017-02-12 ENCOUNTER — Encounter: Payer: Self-pay | Admitting: *Deleted

## 2017-02-26 ENCOUNTER — Other Ambulatory Visit: Payer: Self-pay | Admitting: Family Medicine

## 2017-02-26 MED ORDER — NABUMETONE 500 MG PO TABS: 1000.0000 mg | ORAL_TABLET | Freq: Every day | ORAL | 2 refills | Status: DC

## 2017-02-26 NOTE — Addendum Note (Signed)
Addended by: Birdie Sons on: 02/26/2017 02:24 PM   Modules accepted: Orders

## 2017-02-26 NOTE — Telephone Encounter (Signed)
Keith Carpenter faxed a refill request on the following medications:  nabumetone (RELAFEN) 500 MG tablet.  Take 1 tablet twice daily as needed.  [name]/MW

## 2017-03-09 ENCOUNTER — Other Ambulatory Visit: Payer: Self-pay | Admitting: Family Medicine

## 2017-03-09 DIAGNOSIS — G4700 Insomnia, unspecified: Secondary | ICD-10-CM

## 2017-03-09 DIAGNOSIS — L4050 Arthropathic psoriasis, unspecified: Secondary | ICD-10-CM

## 2017-03-09 NOTE — Telephone Encounter (Signed)
Refill request for gabapentin 600 mg Last filled by MD on- 03/16/2016 #120 x5 refills  Refill request for zolpidem 10 mg Last filled by MD on- 08/18/2016 #35 x5 Last Appt: 01/22/2017 Next Appt: 03/22/2017 Please advise refill?

## 2017-03-10 NOTE — Telephone Encounter (Signed)
Rx faxed to pharmacy  

## 2017-03-18 ENCOUNTER — Telehealth: Payer: Self-pay | Admitting: Family Medicine

## 2017-03-18 DIAGNOSIS — R5383 Other fatigue: Secondary | ICD-10-CM

## 2017-03-18 DIAGNOSIS — E559 Vitamin D deficiency, unspecified: Secondary | ICD-10-CM

## 2017-03-18 DIAGNOSIS — E291 Testicular hypofunction: Secondary | ICD-10-CM

## 2017-03-18 NOTE — Telephone Encounter (Signed)
Left message to call back  

## 2017-03-18 NOTE — Telephone Encounter (Signed)
Please advise patient that insurance will cover fentanyl patch, but no more than 10 in a month. He can either cut down to one every three days, or pay for the other five out of pocket.

## 2017-03-22 ENCOUNTER — Encounter: Payer: Self-pay | Admitting: Family Medicine

## 2017-03-22 ENCOUNTER — Ambulatory Visit (INDEPENDENT_AMBULATORY_CARE_PROVIDER_SITE_OTHER): Payer: BC Managed Care – PPO | Admitting: Family Medicine

## 2017-03-22 VITALS — BP 118/72 | HR 52 | Temp 98.5°F | Resp 16 | Wt 232.0 lb

## 2017-03-22 DIAGNOSIS — E208 Other hypoparathyroidism: Secondary | ICD-10-CM

## 2017-03-22 DIAGNOSIS — I10 Essential (primary) hypertension: Secondary | ICD-10-CM

## 2017-03-22 DIAGNOSIS — R5383 Other fatigue: Secondary | ICD-10-CM | POA: Diagnosis not present

## 2017-03-22 DIAGNOSIS — E119 Type 2 diabetes mellitus without complications: Secondary | ICD-10-CM | POA: Diagnosis not present

## 2017-03-22 DIAGNOSIS — E039 Hypothyroidism, unspecified: Secondary | ICD-10-CM

## 2017-03-22 DIAGNOSIS — F341 Dysthymic disorder: Secondary | ICD-10-CM

## 2017-03-22 DIAGNOSIS — E785 Hyperlipidemia, unspecified: Secondary | ICD-10-CM | POA: Diagnosis not present

## 2017-03-22 DIAGNOSIS — E559 Vitamin D deficiency, unspecified: Secondary | ICD-10-CM

## 2017-03-22 NOTE — Progress Notes (Signed)
Patient: Keith Carpenter Male    DOB: 1956/03/25   61 y.o.   MRN: [id number] Visit Date: 03/22/2017  Today's Provider: Lelon Huh, MD   Chief Complaint  Patient presents with  . Diabetes  . Hypertension  . Hyperlipidemia  . Hypothyroidism  . Insomnia   Subjective:    HPI  Hypertension, follow-up:  BP Readings from Last 3 Encounters:  03/22/17 118/72  01/22/17 120/64  01/04/17 120/70    He was last seen for hypertension 4 months ago.  BP at that visit was 142/64. Management since that visit includes no changes. He reports good compliance with treatment. He is not having side effects.  He is exercising. He is adherent to low salt diet.   Outside blood pressures are checked daily. He is experiencing fatigue.  Patient denies exertional chest pressure/discomfort, lower extremity edema and palpitations.   Cardiovascular risk factors include diabetes mellitus and dyslipidemia.      Weight trend: stable Wt Readings from Last 3 Encounters:  03/22/17 232 lb (105.2 kg)  01/22/17 233 lb (105.7 kg)  01/04/17 228 lb (103.4 kg)    Current diet: well balanced    Diabetes Mellitus Type II, Follow-up:   Lab Results  Component Value Date   HGBA1C 7.0 11/17/2016   HGBA1C 8.0 08/18/2016   HGBA1C 7.8 05/11/2016    Last seen for diabetes 4 months ago.  Management since then includes adding Januvia. He reports good compliance with treatment. He is not having side effects.  Current symptoms include none and have been stable. Home blood sugar records: fasting range: 110s  Episodes of hypoglycemia? no   Current Insulin Regimen: none Most Recent Eye Exam: up to date Weight trend: stable Prior visit with dietician: no Current diet: well balanced Current exercise: none  Pertinent Labs:    Component Value Date/Time   CHOL 168 01/10/2016 0815   CHOL 220 (H) 02/23/2012 0416   TRIG 116 01/10/2016 0815   TRIG 104 02/23/2012 0416   HDL 42 01/10/2016 0815     HDL 36 (L) 02/23/2012 0416   LDLCALC 103 (H) 01/10/2016 0815   LDLCALC 163 (H) 02/23/2012 0416   CREATININE 0.80 05/05/2016 1831   CREATININE 0.82 02/23/2012 0416    Wt Readings from Last 3 Encounters:  03/22/17 232 lb (105.2 kg)  01/22/17 233 lb (105.7 kg)  01/04/17 228 lb (103.4 kg)      Lipid/Cholesterol, Follow-up:   Last seen for this4 months ago.  Management changes since that visit include no changes. . Last Lipid Panel:    Component Value Date/Time   CHOL 168 01/10/2016 0815   CHOL 220 (H) 02/23/2012 0416   TRIG 116 01/10/2016 0815   TRIG 104 02/23/2012 0416   HDL 42 01/10/2016 0815   HDL 36 (L) 02/23/2012 0416   CHOLHDL 4.0 01/10/2016 0815   VLDL 21 02/23/2012 0416   LDLCALC 103 (H) 01/10/2016 0815   LDLCALC 163 (H) 02/23/2012 0416    Risk factors for vascular disease include diabetes mellitus, hypercholesterolemia and hypertension  He reports good compliance with treatment. He is not having side effects.  Current symptoms include none and have been stable. Weight trend: stable Prior visit with dietician: no Current diet: well balanced Current exercise: none  Wt Readings from Last 3 Encounters:  03/22/17 232 lb (105.2 kg)  01/22/17 233 lb (105.7 kg)  01/04/17 228 lb (103.4 kg)      Hypothyroidism, follow up: Last follow up was 1  year ago. No changes were made in his medications. Patient is currently taking levothyroxine 150mcg daily.    Insomnia: Patient reports that he is still having trouble sleeping. He reports that on average he gets 4 hours nightly. He is currently taking Melatonin OTC which is not helping.  Has been out of work since school let out.  He feels extremely sleepy during the day.  Pain down left arm when he takes a deep breath    Allergies  Allergen Reactions  . Influenza Vaccines Other (See Comments)    Guillain Barre  . Declomycin [Demeclocycline]   . Demeclocycline Hcl   . Ginger   . Glipizide     Nausea   .  Metformin And Related Nausea Only    Metformin IR 500 BID  . Remicade [Infliximab]   . Suvorexant Other (See Comments)    vision changes  . Pioglitazone Itching, Swelling and Rash     Current Outpatient Prescriptions:  .  amLODipine (NORVASC) 5 MG tablet, TAKE ONE (1) TABLET EACH DAY, Disp: 30 tablet, Rfl: 6 .  aspirin EC 81 MG tablet, Take 1 tablet (81 mg total) by mouth daily., Disp: 90 tablet, Rfl: 3 .  atorvastatin (LIPITOR) 10 MG tablet, Take 1 tablet (10 mg total) by mouth every evening., Disp: 90 tablet, Rfl: 3 .  DULoxetine (CYMBALTA) 60 MG capsule, TAKE ONE CAPSULE BY MOUTH DAILY, Disp: 90 capsule, Rfl: 4 .  fentaNYL (DURAGESIC - DOSED MCG/HR) 50 MCG/HR, Place 1 patch (50 mcg total) onto the skin every other day., Disp: 15 patch, Rfl: 0 .  gabapentin (NEURONTIN) 600 MG tablet, TAKE 1 TABLET EVERY MORNING, 1 TABLET MIDDAY AND 2 TABLETS AT BEDTIME, Disp: 120 tablet, Rfl: 5 .  HYDROcodone-acetaminophen (NORCO/VICODIN) 5-325 MG tablet, TAKE ONE TABLET TWICE DAILY AS NEEDED FOR MODERATE PAIN, Disp: 60 tablet, Rfl: 0 .  levothyroxine (SYNTHROID, LEVOTHROID) 137 MCG tablet, Take 1 tablet (137 mcg total) by mouth daily., Disp: 90 tablet, Rfl: 3 .  lisinopril (PRINIVIL,ZESTRIL) 10 MG tablet, Take 10 mg by mouth daily., Disp: , Rfl:  .  MELATONIN PO, Take 1-2 tablets by mouth as needed., Disp: , Rfl:  .  metoprolol (LOPRESSOR) 50 MG tablet, Take 1 tablet (50 mg total) by mouth 2 (two) times daily., Disp: 60 tablet, Rfl: 12 .  mirtazapine (REMERON) 15 MG tablet, TAKE ONE (1) TABLET BY MOUTH EVERY DAY, Disp: 30 tablet, Rfl: 12 .  nabumetone (RELAFEN) 500 MG tablet, Take 2 tablets (1,000 mg total) by mouth daily., Disp: 60 tablet, Rfl: 2 .  sitaGLIPtin (JANUVIA) 100 MG tablet, Take 1 tablet (100 mg total) by mouth daily., Disp: 30 tablet, Rfl: 3 .  zolpidem (AMBIEN) 10 MG tablet, TAKE ONE TO TWO TABLETS BY MOUTH AT BEDTIME AS NEEDED FOR INSOMNIA, Disp: 35 tablet, Rfl: 1 .  CUTIVATE 0.05 % LOTN,  APPLY TO AFFECTED AREA TWICE A DAY AS NEEDED FOR RASH, Disp: 120 mL, Rfl: 5  Review of Systems  Constitutional: Positive for fatigue. Negative for activity change, appetite change, chills, diaphoresis, fever and unexpected weight change.  Respiratory: Negative.   Cardiovascular: Negative.   Musculoskeletal: Negative.     Social History  Substance Use Topics  . Smoking status: Never Smoker  . Smokeless tobacco: Never Used  . Alcohol use No   Objective:   BP 118/72 (BP Location: Right Arm, Patient Position: Sitting, Cuff Size: Normal)   Pulse (!) 52   Temp 98.5 F (36.9 C)   Resp 16  Wt 232 lb (105.2 kg)   SpO2 98%   BMI 32.36 kg/m  Vitals:   03/22/17 0851  BP: 118/72  Pulse: (!) 52  Resp: 16  Temp: 98.5 F (36.9 C)  SpO2: 98%  Weight: 232 lb (105.2 kg)     Physical Exam   General Appearance:    Alert, cooperative, no distress  Eyes:    PERRL, conjunctiva/corneas clear, EOM's intact       Lungs:     Clear to auscultation bilaterally, respirations unlabored  Heart:    Regular rate and rhythm  Neurologic:   Awake, alert, oriented x 3. No apparent focal neurological           defect.           Assessment & Plan:     1. Essential hypertension Well controlled.  Continue current medications.    2. Type 2 diabetes mellitus without complication, without long-term current use of insulin (HCC)  - Hemoglobin A1c  3. Hypothyroidism, unspecified type  4. Other hypoparathyroidism ([place])  - T4 AND TSH  5. Vitamin D deficiency  - VITAMIN D 25 Hydroxy (Vit-D Deficiency, Fractures)  6. Hyperlipidemia, unspecified hyperlipidemia type Diet controlled.  - Lipid panel - Comprehensive metabolic panel  7. Depression, neurotic Continue current medications.    8. Other fatigue  - CBC - Testosterone,Free and Total - T4 AND TSH       Lelon Huh, MD  [facility]

## 2017-03-22 NOTE — Patient Instructions (Addendum)
Mirtazapine tends to be more sedating at lower doses. Try reducing to 1/2 of 15mg  tablet at night  Insomnia Insomnia is a sleep disorder that makes it difficult to fall asleep or to stay asleep. Insomnia can cause tiredness (fatigue), low energy, difficulty concentrating, mood swings, and poor performance at work or school. There are three different ways to classify insomnia:  Difficulty falling asleep.  Difficulty staying asleep.  Waking up too early in the morning.  Any type of insomnia can be long-term (chronic) or short-term (acute). Both are common. Short-term insomnia usually lasts for three months or less. Chronic insomnia occurs at least three times a week for longer than three months. What are the causes? Insomnia may be caused by another condition, situation, or substance, such as:  Anxiety.  Certain medicines.  Gastroesophageal reflux disease (GERD) or other gastrointestinal conditions.  Asthma or other breathing conditions.  Restless legs syndrome, sleep apnea, or other sleep disorders.  Chronic pain.  Menopause. This may include hot flashes.  Stroke.  Abuse of alcohol, tobacco, or illegal drugs.  Depression.  Caffeine.  Neurological disorders, such as Alzheimer disease.  An overactive thyroid (hyperthyroidism).  The cause of insomnia may not be known. What increases the risk? Risk factors for insomnia include:  Gender. Women are more commonly affected than men.  Age. Insomnia is more common as you get older.  Stress. This may involve your professional or personal life.  Income. Insomnia is more common in people with lower income.  Lack of exercise.  Irregular work schedule or night shifts.  Traveling between different time zones.  What are the signs or symptoms? If you have insomnia, trouble falling asleep or trouble staying asleep is the main symptom. This may lead to other symptoms, such as:  Feeling fatigued.  Feeling nervous about  going to sleep.  Not feeling rested in the morning.  Having trouble concentrating.  Feeling irritable, anxious, or depressed.  How is this treated? Treatment for insomnia depends on the cause. If your insomnia is caused by an underlying condition, treatment will focus on addressing the condition. Treatment may also include:  Medicines to help you sleep.  Counseling or therapy.  Lifestyle adjustments.  Follow these instructions at home:  Take medicines only as directed by your health care provider.  Keep regular sleeping and waking hours. Avoid naps.  Keep a sleep diary to help you and your health care provider figure out what could be causing your insomnia. Include: ? When you sleep. ? When you wake up during the night. ? How well you sleep. ? How rested you feel the next day. ? Any side effects of medicines you are taking. ? What you eat and drink.  Make your bedroom a comfortable place where it is easy to fall asleep: ? Put up shades or special blackout curtains to block light from outside. ? Use a white noise machine to block noise. ? Keep the temperature cool.  Exercise regularly as directed by your health care provider. Avoid exercising right before bedtime.  Use relaxation techniques to manage stress. Ask your health care provider to suggest some techniques that may work well for you. These may include: ? Breathing exercises. ? Routines to release muscle tension. ? Visualizing peaceful scenes.  Cut back on alcohol, caffeinated beverages, and cigarettes, especially close to bedtime. These can disrupt your sleep.  Do not overeat or eat spicy foods right before bedtime. This can lead to digestive discomfort that can make it hard for you  to sleep.  Limit screen use before bedtime. This includes: ? Watching TV. ? Using your smartphone, tablet, and computer.  Stick to a routine. This can help you fall asleep faster. Try to do a quiet activity, brush your teeth, and  go to bed at the same time each night.  Get out of bed if you are still awake after 15 minutes of trying to sleep. Keep the lights down, but try reading or doing a quiet activity. When you feel sleepy, go back to bed.  Make sure that you drive carefully. Avoid driving if you feel very sleepy.  Keep all follow-up appointments as directed by your health care provider. This is important. Contact a health care provider if:  You are tired throughout the day or have trouble in your daily routine due to sleepiness.  You continue to have sleep problems or your sleep problems get worse. Get help right away if:  You have serious thoughts about hurting yourself or someone else. This information is not intended to replace advice given to you by your health care provider. Make sure you discuss any questions you have with your health care provider. Document Released: 08/28/2000 Document Revised: 01/31/2016 Document Reviewed: 06/01/2014 Elsevier Interactive Patient Education  [employer].

## 2017-03-23 LAB — CBC
HEMOGLOBIN: 13.3 g/dL (ref 13.0–17.7)
Hematocrit: 38.3 % (ref 37.5–51.0)
MCH: 29.4 pg (ref 26.6–33.0)
MCHC: 34.7 g/dL (ref 31.5–35.7)
MCV: 85 fL (ref 79–97)
PLATELETS: 265 10*3/uL (ref 150–379)
RBC: 4.52 x10E6/uL (ref 4.14–5.80)
RDW: 13.9 % (ref 12.3–15.4)
WBC: 8.7 10*3/uL (ref 3.4–10.8)

## 2017-03-23 LAB — LIPID PANEL
Chol/HDL Ratio: 4.4 ratio (ref 0.0–5.0)
Cholesterol, Total: 171 mg/dL (ref 100–199)
HDL: 39 mg/dL — ABNORMAL LOW (ref >39)
LDL Calculated: 109 mg/dL — ABNORMAL HIGH (ref 0–99)
Triglycerides: 117 mg/dL (ref 0–149)
VLDL Cholesterol Cal: 23 mg/dL (ref 5–40)

## 2017-03-23 LAB — TESTOSTERONE,FREE AND TOTAL
TESTOSTERONE FREE: 5.1 pg/mL — AB (ref 7.2–24.0)
TESTOSTERONE: 108 ng/dL — AB (ref 264–916)

## 2017-03-23 LAB — COMPREHENSIVE METABOLIC PANEL
ALT: 21 IU/L (ref 0–44)
AST: 17 IU/L (ref 0–40)
Albumin/Globulin Ratio: 1.3 (ref 1.2–2.2)
Albumin: 4.1 g/dL (ref 3.5–5.5)
Alkaline Phosphatase: 93 IU/L (ref 39–117)
BUN/Creatinine Ratio: 21 — ABNORMAL HIGH (ref 9–20)
BUN: 22 mg/dL (ref 6–24)
Bilirubin Total: 0.3 mg/dL (ref 0.0–1.2)
CO2: 26 mmol/L (ref 20–29)
Calcium: 8.6 mg/dL — ABNORMAL LOW (ref 8.7–10.2)
Chloride: 94 mmol/L — ABNORMAL LOW (ref 96–106)
Creatinine, Ser: 1.06 mg/dL (ref 0.76–1.27)
GFR calc Af Amer: 89 mL/min/{1.73_m2} (ref >59)
GFR calc non Af Amer: 77 mL/min/{1.73_m2} (ref >59)
Globulin, Total: 3.1 g/dL (ref 1.5–4.5)
Glucose: 121 mg/dL — ABNORMAL HIGH (ref 65–99)
Potassium: 4.8 mmol/L (ref 3.5–5.2)
Sodium: 142 mmol/L (ref 134–144)
Total Protein: 7.2 g/dL (ref 6.0–8.5)

## 2017-03-23 LAB — HEMOGLOBIN A1C
Est. average glucose Bld gHb Est-mCnc: 146 mg/dL
Hgb A1c MFr Bld: 6.7 % — ABNORMAL HIGH (ref 4.8–5.6)

## 2017-03-23 LAB — VITAMIN D 25 HYDROXY (VIT D DEFICIENCY, FRACTURES): Vit D, 25-Hydroxy: 19.8 ng/mL — ABNORMAL LOW (ref 30.0–100.0)

## 2017-03-23 LAB — T4 AND TSH
T4 TOTAL: 8.9 ug/dL (ref 4.5–12.0)
TSH: 3.19 u[IU]/mL (ref 0.450–4.500)

## 2017-03-24 NOTE — Telephone Encounter (Signed)
Left message to call back  

## 2017-03-24 NOTE — Telephone Encounter (Signed)
-----   Message from Birdie Sons, MD sent at 03/23/2017  4:37 PM EDT ----- He does have very low testosterone which is probably contributing to fatigue. He would probably feel better with testosterone replacement. In order for insurance to cover testosterone replacement, he will need a repeat free and total testosterone level, and prolactin level  Also, vitamin D level is very low, need to take vitamin D 50,000 units once a week, #12, rf x 4  A1c is good at 6.7%

## 2017-03-25 NOTE — Telephone Encounter (Signed)
Pt returned call ° °teri °

## 2017-03-26 MED ORDER — VITAMIN D (ERGOCALCIFEROL) 1.25 MG (50000 UNIT) PO CAPS: 50000.0000 [IU] | ORAL_CAPSULE | ORAL | 4 refills | Status: DC

## 2017-03-26 NOTE — Telephone Encounter (Signed)
Advised patient as below. Dr. Caryn Section, patient wants to know if he needs to be fasting? Also, is this the test that he has to have drawn early in the morning? Please advise. Thanks!

## 2017-03-26 NOTE — Telephone Encounter (Signed)
RX sent in for Vitamin D and lab slip placed up front-aa

## 2017-03-26 NOTE — Telephone Encounter (Signed)
He does not have to be fasting, but it should be drawn first thing in the morning. The lab opens at [place]

## 2017-03-31 ENCOUNTER — Other Ambulatory Visit: Payer: Self-pay | Admitting: Family Medicine

## 2017-03-31 ENCOUNTER — Other Ambulatory Visit: Payer: Self-pay | Admitting: Emergency Medicine

## 2017-03-31 MED ORDER — TESTOSTERONE 2 MG/24HR TD PT24: 1.0000 | MEDICATED_PATCH | Freq: Every day | TRANSDERMAL | 2 refills | Status: DC

## 2017-04-01 ENCOUNTER — Other Ambulatory Visit: Payer: Self-pay | Admitting: Family Medicine

## 2017-04-01 DIAGNOSIS — E785 Hyperlipidemia, unspecified: Secondary | ICD-10-CM

## 2017-04-01 LAB — TESTOSTERONE,FREE AND TOTAL
Testosterone, Free: 3.3 pg/mL — ABNORMAL LOW (ref 7.2–24.0)
Testosterone: 77 ng/dL — ABNORMAL LOW (ref 264–916)

## 2017-04-01 LAB — PROLACTIN: Prolactin: 14.5 ng/mL (ref 4.0–15.2)

## 2017-04-01 MED ORDER — ATORVASTATIN CALCIUM 10 MG PO TABS: 10.0000 mg | ORAL_TABLET | Freq: Every evening | ORAL | 3 refills | Status: DC

## 2017-04-01 NOTE — Telephone Encounter (Signed)
[facility] faxed a request on the following medication. Thanks CC  atorvastatin (LIPITOR) 10 MG tablet > Take 1 tablet by mouth every evening

## 2017-04-05 ENCOUNTER — Other Ambulatory Visit: Payer: Self-pay | Admitting: Family Medicine

## 2017-04-05 DIAGNOSIS — E785 Hyperlipidemia, unspecified: Secondary | ICD-10-CM

## 2017-04-05 DIAGNOSIS — L4050 Arthropathic psoriasis, unspecified: Secondary | ICD-10-CM

## 2017-04-05 MED ORDER — FENTANYL 50 MCG/HR TD PT72: 50.0000 ug | MEDICATED_PATCH | TRANSDERMAL | 0 refills | Status: DC

## 2017-04-05 MED ORDER — ATORVASTATIN CALCIUM 10 MG PO TABS: 10.0000 mg | ORAL_TABLET | Freq: Every evening | ORAL | 3 refills | Status: DC

## 2017-04-30 ENCOUNTER — Other Ambulatory Visit: Payer: Self-pay | Admitting: Family Medicine

## 2017-04-30 DIAGNOSIS — L4050 Arthropathic psoriasis, unspecified: Secondary | ICD-10-CM

## 2017-04-30 MED ORDER — HYDROCODONE-ACETAMINOPHEN 5-325 MG PO TABS: ORAL_TABLET | ORAL | 0 refills | Status: DC

## 2017-04-30 NOTE — Telephone Encounter (Signed)
Keith Carpenter faxed a refill request on the following medications:  HYDROcodone-acetaminophen (NORCO/VICODIN) 5-325 MG tablet.  Take 1 tablet twice daily as needed for moderate pain/MW

## 2017-05-14 ENCOUNTER — Other Ambulatory Visit: Payer: Self-pay | Admitting: Family Medicine

## 2017-05-14 DIAGNOSIS — E119 Type 2 diabetes mellitus without complications: Secondary | ICD-10-CM

## 2017-05-14 DIAGNOSIS — G4700 Insomnia, unspecified: Secondary | ICD-10-CM

## 2017-05-16 ENCOUNTER — Telehealth: Payer: Self-pay | Admitting: Family Medicine

## 2017-05-16 DIAGNOSIS — E291 Testicular hypofunction: Secondary | ICD-10-CM

## 2017-05-16 NOTE — Telephone Encounter (Signed)
-----   Message from Birdie Sons, MD sent at 05/14/2017  8:35 AM EDT ----- Regarding: FW: check testosterone level 1 month (before aug 18th.    ----- Message ----- From: Birdie Sons, MD Sent: 05/02/2017  10:13 AM To: Birdie Sons, MD Subject: FW: check testosterone level 1 month (before#  Please check with patient to see if he using the testosterone cream. If so need to check free and total testosterone.    ----- Message ----- From: Birdie Sons, MD Sent: 04/26/2017 To: Birdie Sons, MD Subject: check testosterone level 1 month (before aug#

## 2017-05-18 NOTE — Telephone Encounter (Signed)
Left message to call back  

## 2017-05-19 ENCOUNTER — Other Ambulatory Visit: Payer: Self-pay

## 2017-05-19 DIAGNOSIS — E291 Testicular hypofunction: Secondary | ICD-10-CM

## 2017-05-19 NOTE — Addendum Note (Signed)
Addended by: Julieta Bellini on: 05/19/2017 02:30 PM   Modules accepted: Orders

## 2017-05-19 NOTE — Telephone Encounter (Signed)
Patient reports that he is using the testosterone patch. Lab slip was printed so that he can have labs checked.

## 2017-05-23 LAB — TESTOSTERONE, FREE & TOTAL
FREE TESTOSTERONE: 13.2 pg/mL — AB (ref 35.0–155.0)
TESTOSTERONE, TOTAL, LC-MS-MS: 120 ng/dL — AB (ref 250–1100)

## 2017-05-24 ENCOUNTER — Telehealth: Payer: Self-pay | Admitting: *Deleted

## 2017-05-24 MED ORDER — TESTOSTERONE 4 MG/24HR TD PT24: 1.0000 | MEDICATED_PATCH | Freq: Every day | TRANSDERMAL | 3 refills | Status: DC

## 2017-05-24 NOTE — Telephone Encounter (Signed)
Patient was notified of results. Expressed understanding. Rx sent to pharmacy. 

## 2017-05-24 NOTE — Telephone Encounter (Signed)
-----   Message from Birdie Sons, MD sent at 05/23/2017  8:40 PM EDT ----- Testosterone levels are much better, but still low. Recommend increase testrone patch to 4mg //24d, can call in #30 patches, rf x 3 recheck in 1 month.

## 2017-05-26 ENCOUNTER — Other Ambulatory Visit: Payer: Self-pay | Admitting: Family Medicine

## 2017-05-26 DIAGNOSIS — G4700 Insomnia, unspecified: Secondary | ICD-10-CM

## 2017-06-01 ENCOUNTER — Other Ambulatory Visit: Payer: Self-pay | Admitting: Family Medicine

## 2017-06-01 DIAGNOSIS — I10 Essential (primary) hypertension: Secondary | ICD-10-CM

## 2017-06-01 DIAGNOSIS — G4700 Insomnia, unspecified: Secondary | ICD-10-CM

## 2017-06-02 NOTE — Telephone Encounter (Signed)
Please call in zolpidem  

## 2017-06-02 NOTE — Telephone Encounter (Signed)
rx called in-Ariel Wingrove V Markevion Lattin, RMA  

## 2017-06-25 ENCOUNTER — Other Ambulatory Visit: Payer: Self-pay | Admitting: Family Medicine

## 2017-06-25 DIAGNOSIS — G4700 Insomnia, unspecified: Secondary | ICD-10-CM

## 2017-06-26 NOTE — Telephone Encounter (Signed)
Please call in zolpidem  

## 2017-06-28 NOTE — Telephone Encounter (Signed)
Rx called in to pharmacy. 

## 2017-07-20 ENCOUNTER — Encounter: Payer: Self-pay | Admitting: Family Medicine

## 2017-07-20 ENCOUNTER — Telehealth: Payer: Self-pay | Admitting: *Deleted

## 2017-07-20 ENCOUNTER — Other Ambulatory Visit: Payer: Self-pay | Admitting: Family Medicine

## 2017-07-20 ENCOUNTER — Ambulatory Visit: Payer: BC Managed Care – PPO | Admitting: Family Medicine

## 2017-07-20 VITALS — BP 156/84 | HR 66 | Temp 99.3°F | Resp 16 | Wt 229.0 lb

## 2017-07-20 DIAGNOSIS — K921 Melena: Secondary | ICD-10-CM | POA: Diagnosis not present

## 2017-07-20 DIAGNOSIS — K6289 Other specified diseases of anus and rectum: Secondary | ICD-10-CM | POA: Diagnosis not present

## 2017-07-20 DIAGNOSIS — R1013 Epigastric pain: Secondary | ICD-10-CM | POA: Diagnosis not present

## 2017-07-20 DIAGNOSIS — R1084 Generalized abdominal pain: Secondary | ICD-10-CM

## 2017-07-20 LAB — COMPLETE METABOLIC PANEL WITH GFR
AG RATIO: 1.3 (calc) (ref 1.0–2.5)
ALT: 30 U/L (ref 9–46)
AST: 23 U/L (ref 10–35)
Albumin: 4.4 g/dL (ref 3.6–5.1)
Alkaline phosphatase (APISO): 72 U/L (ref 40–115)
BILIRUBIN TOTAL: 0.5 mg/dL (ref 0.2–1.2)
BUN: 19 mg/dL (ref 7–25)
CALCIUM: 8.9 mg/dL (ref 8.6–10.3)
CHLORIDE: 98 mmol/L (ref 98–110)
CO2: 28 mmol/L (ref 20–32)
Creat: 0.93 mg/dL (ref 0.70–1.33)
GFR, EST NON AFRICAN AMERICAN: 90 mL/min/{1.73_m2} (ref >=60)
GFR, Est African American: 104 mL/min/{1.73_m2} (ref >=60)
GLOBULIN: 3.3 g/dL (ref 1.9–3.7)
Glucose, Bld: 149 mg/dL — ABNORMAL HIGH (ref 65–99)
POTASSIUM: 4.5 mmol/L (ref 3.5–5.3)
SODIUM: 135 mmol/L (ref 135–146)
Total Protein: 7.7 g/dL (ref 6.1–8.1)

## 2017-07-20 LAB — CBC
HEMATOCRIT: 40.9 % (ref 38.5–50.0)
Hemoglobin: 14.2 g/dL (ref 13.2–17.1)
MCH: 29.8 pg (ref 27.0–33.0)
MCHC: 34.7 g/dL (ref 32.0–36.0)
MCV: 85.7 fL (ref 80.0–100.0)
MPV: 10.2 fL (ref 7.5–12.5)
PLATELETS: 270 10*3/uL (ref 140–400)
RBC: 4.77 10*6/uL (ref 4.20–5.80)
RDW: 13.4 % (ref 11.0–15.0)
WBC: 8.6 10*3/uL (ref 3.8–10.8)

## 2017-07-20 LAB — LIPASE: Lipase: 8 U/L (ref 7–60)

## 2017-07-20 LAB — AMYLASE: Amylase: 29 U/L (ref 21–101)

## 2017-07-20 MED ORDER — DEXLANSOPRAZOLE 60 MG PO CPDR: 60.0000 mg | DELAYED_RELEASE_CAPSULE | Freq: Every day | ORAL | 0 refills | Status: DC

## 2017-07-20 NOTE — Progress Notes (Signed)
Patient: Keith Carpenter Male    DOB: June 19, 1956   61 y.o.   MRN: [id number] Visit Date: 07/20/2017  Today's Provider: Lelon Huh, MD   Chief Complaint  Patient presents with  . Abdominal Pain   Subjective:    Abdominal Pain  This is a new problem. The current episode started yesterday. The onset quality is sudden. The problem occurs constantly. The problem has been gradually worsening. The pain is located in the epigastric region. The quality of the pain is sharp. The abdominal pain does not radiate. Associated symptoms include headaches and melena. Pertinent negatives include no belching, constipation, diarrhea, fever, nausea or vomiting.  Patient rates pain 8/10. He cannot tell if eating makes it better or worse, but has not had an appetitie.  Patient states he had a similar episode of abdominal pain a few weeks ago. Patient states it feels like his spine is on fire. Denies nausea, vomiting, fevers, chills.   Tried OTC pepto-bismo and OTC acid reducer which helped for a few minutes.  Also state having blood in stool several weeks Denies taking any OTC NSAIDs. Does not drink alcohol.  Feels weak, but not light headed or dizzy.     Allergies  Allergen Reactions  . Influenza Vaccines Other (See Comments)    Guillain Barre  . Declomycin [Demeclocycline]   . Demeclocycline Hcl   . Ginger   . Glipizide     Nausea   . Metformin And Related Nausea Only    Metformin IR 500 BID  . Remicade [Infliximab]   . Suvorexant Other (See Comments)    vision changes  . Pioglitazone Itching, Swelling and Rash     Current Outpatient Medications:  .  amLODipine (NORVASC) 5 MG tablet, TAKE ONE (1) TABLET EACH DAY, Disp: 30 tablet, Rfl: 6 .  aspirin EC 81 MG tablet, Take 1 tablet (81 mg total) by mouth daily., Disp: 90 tablet, Rfl: 3 .  atorvastatin (LIPITOR) 10 MG tablet, Take 1 tablet (10 mg total) by mouth every evening., Disp: 90 tablet, Rfl: 3 .  CUTIVATE 0.05 % LOTN,  APPLY TO AFFECTED AREA TWICE A DAY AS NEEDED FOR RASH, Disp: 120 mL, Rfl: 5 .  DULoxetine (CYMBALTA) 60 MG capsule, TAKE ONE CAPSULE BY MOUTH DAILY, Disp: 90 capsule, Rfl: 4 .  fentaNYL (DURAGESIC - DOSED MCG/HR) 50 MCG/HR, Place 1 patch (50 mcg total) onto the skin every other day., Disp: 15 patch, Rfl: 0 .  gabapentin (NEURONTIN) 600 MG tablet, TAKE 1 TABLET EVERY MORNING, 1 TABLET MIDDAY AND 2 TABLETS AT BEDTIME, Disp: 120 tablet, Rfl: 5 .  HYDROcodone-acetaminophen (NORCO/VICODIN) 5-325 MG tablet, TAKE ONE TABLET TWICE DAILY AS NEEDED FOR MODERATE PAIN, Disp: 60 tablet, Rfl: 0 .  JANUVIA 100 MG tablet, TAKE ONE TABLET BY MOUTH DAILY, Disp: 30 tablet, Rfl: 12 .  levothyroxine (SYNTHROID, LEVOTHROID) 137 MCG tablet, Take 1 tablet (137 mcg total) by mouth daily., Disp: 90 tablet, Rfl: 3 .  lisinopril (PRINIVIL,ZESTRIL) 10 MG tablet, Take 10 mg by mouth daily., Disp: , Rfl:  .  MELATONIN PO, Take 1-2 tablets by mouth as needed., Disp: , Rfl:  .  metoprolol tartrate (LOPRESSOR) 50 MG tablet, TAKE ONE TABLET TWICE DAILY, Disp: 60 tablet, Rfl: 5 .  mirtazapine (REMERON) 15 MG tablet, TAKE ONE (1) TABLET BY MOUTH EVERY DAY, Disp: 30 tablet, Rfl: 12 .  nabumetone (RELAFEN) 500 MG tablet, Take 2 tablets (1,000 mg total) by mouth daily., Disp: 60 tablet,  Rfl: 2 .  testosterone (ANDRODERM) 4 MG/24HR PT24 patch, Place 1 patch onto the skin daily., Disp: 30 patch, Rfl: 3 .  Vitamin D, Ergocalciferol, (DRISDOL) 50000 units CAPS capsule, Take 1 capsule (50,000 Units total) by mouth every 7 (seven) days., Disp: 12 capsule, Rfl: 4 .  zolpidem (AMBIEN) 10 MG tablet, TAKE 1 TO 2 TABLETS AT BEDTIME AS NEEDED., Disp: 35 tablet, Rfl: 5  Review of Systems  Constitutional: Negative for appetite change, chills and fever.  Respiratory: Negative for chest tightness, shortness of breath and wheezing.   Cardiovascular: Negative for chest pain and palpitations.  Gastrointestinal: Positive for abdominal pain, blood in stool  and melena. Negative for constipation, diarrhea, nausea and vomiting.  Neurological: Positive for headaches.    Social History   Tobacco Use  . Smoking status: Never Smoker  . Smokeless tobacco: Never Used  Substance Use Topics  . Alcohol use: No   Objective:   BP (!) 156/84 (BP Location: Right Arm, Cuff Size: Large)   Pulse 66   Temp 99.3 F (37.4 C) (Oral)   Resp 16   Wt 229 lb (103.9 kg)   SpO2 97% Comment: room air  BMI 31.94 kg/m  Vitals:   07/20/17 1049 07/20/17 1057  BP: (!) 158/90 (!) 156/84  Pulse: 66   Resp: 16   Temp: 99.3 F (37.4 C)   TempSrc: Oral   SpO2: 97%   Weight: 229 lb (103.9 kg)      Physical Exam  General Appearance:    Alert, cooperative, mild distress due to pain.   Eyes:    PERRL, conjunctiva/corneas clear, EOM's intact       Lungs:     Clear to auscultation bilaterally, respirations unlabored  Heart:    Regular rate and rhythm  Rectal:   Rectum is very tender to palpation. Brown stool, guaiac positive. No discrete masses.   Abdomen:   bowel sounds present and normal in all 4 quadrants, soft, round. Moderate upper abdominal tenderness, worse in epigastrium. No CVA tenderness        Assessment & Plan:      1. Epigastric pain  - CBC - COMPLETE METABOLIC PANEL WITH GFR - Amylase - Lipase - Helicobacter pylori abs-IgG+IgA, bld  2. Melena  - CBC - COMPLETE METABOLIC PANEL WITH GFR - Amylase - Lipase - Helicobacter pylori abs-IgG+IgA, bld  3. Rectal pain  - CBC - COMPLETE METABOLIC PANEL WITH GFR - Amylase - Lipase - Helicobacter pylori abs-IgG+IgA, bld  Likely gastritis/duodenitis. Possible inflammatory bowel disease. Encouraged patient to go to ER for pain management and more urgent evaluation. He is emphatic that he does not want to go to ER. Will go ahead and order stat labs, given samples of Dexilant 60mg  and is to start on OTC ant-acid. He is to go to ER if he develops any fever, chills, nausea, dizziness, vomiting,  diarrhea or worsening or changing abdominal pain.        Lelon Huh, MD  [facility]

## 2017-07-20 NOTE — Patient Instructions (Signed)
   Please go to the [place] lab draw center in [place] on the second floor of [facility] taking OTC Mylanta, one tablespoon every 3-4 hours as needed for epigastric pain   Start Dexilant 60mg  samples, one tablet daily   Go to ER if you develop any of the following  Fever over 100.5  Vomiting  Diarrhea    Shortness of breath  Dizziness or lightheaded  Worsening or changing abdominal pain

## 2017-07-20 NOTE — Telephone Encounter (Signed)
Patient called office stating he has had severe stomach pain twice in the last month. Symptoms include stomach pain, burning sensation in his spine, severe headache, no appetite, and blood in his stool. Patient states 2 weeks ago symptoms started with stomach pain and blood in stool that lasted a few days. Last night stomach pain started again with headache and bloody stool. No dizziness or lightheadedness. Patient scheduled appt for today at 10:45 am with Dr. Caryn Section.

## 2017-07-20 NOTE — Progress Notes (Signed)
Advised  ED 

## 2017-07-21 ENCOUNTER — Telehealth: Payer: Self-pay | Admitting: Family Medicine

## 2017-07-21 NOTE — Telephone Encounter (Addendum)
Please advise patient that h.pylori test is positive which could cause an ulcer.  Need to start amoxicillin 500mg  two twice daily for 14 days, #56, clarithromycin 500mg  one twice a day for 14 days, #28, and omeprazole 20mg  twice a day for 14 days, #28(can stop samples that were given)  He should stop taking nabumetone which could aggravate an ulcer. Needs to schedule follow up for h.pylori.   If pain is not improving then he will need CT abdomen.

## 2017-07-22 LAB — HELICOBACTER PYLORI ABS-IGG+IGA, BLD: H. pylori, IgA Abs: 12.8 units — ABNORMAL HIGH (ref 0.0–8.9)

## 2017-07-22 LAB — SPECIMEN STATUS REPORT

## 2017-07-22 MED ORDER — AMOXICILLIN 500 MG PO CAPS: 1000.0000 mg | ORAL_CAPSULE | Freq: Two times a day (BID) | ORAL | 0 refills | Status: DC

## 2017-07-22 MED ORDER — CLARITHROMYCIN 500 MG PO TABS: 500.0000 mg | ORAL_TABLET | Freq: Two times a day (BID) | ORAL | 0 refills | Status: DC

## 2017-07-22 MED ORDER — OMEPRAZOLE 20 MG PO CPDR: 20.0000 mg | DELAYED_RELEASE_CAPSULE | Freq: Two times a day (BID) | ORAL | 0 refills | Status: DC

## 2017-07-22 NOTE — Telephone Encounter (Signed)
Pt is returning call.  TI#[phone]/SN

## 2017-07-22 NOTE — Telephone Encounter (Signed)
Patient was notified of results. Patient stated that his symptoms have worsened since yesterday. He states he will most likely go to ER. Dr. Caryn Section was advised. Rx's were sent to pharmacy.

## 2017-07-22 NOTE — Telephone Encounter (Signed)
LMOVM for pt to return call 

## 2017-07-23 ENCOUNTER — Telehealth: Payer: Self-pay | Admitting: Family Medicine

## 2017-07-23 DIAGNOSIS — R1084 Generalized abdominal pain: Secondary | ICD-10-CM

## 2017-07-23 NOTE — Telephone Encounter (Signed)
Insurance company would like to speak peer to peer to get CT approved Phone [phone].Member # [id number].

## 2017-07-23 NOTE — Telephone Encounter (Signed)
Per tech in CT department test should be abd/pelvis with contrast [id number] for diagnosis given

## 2017-07-23 NOTE — Telephone Encounter (Signed)
Approved through 08-21-2017. Approval [id number]

## 2017-07-23 NOTE — Telephone Encounter (Signed)
Order changed.

## 2017-07-26 ENCOUNTER — Telehealth: Payer: Self-pay | Admitting: Family Medicine

## 2017-07-26 NOTE — Telephone Encounter (Signed)
FYI--Pt did not want to get CT done at present time.He states he is on medication that you prescribed and is feeling better.He will call back to reschedule if condition worsens

## 2017-07-27 ENCOUNTER — Telehealth: Payer: Self-pay | Admitting: Family Medicine

## 2017-07-27 MED ORDER — METRONIDAZOLE 500 MG PO TABS: 500.0000 mg | ORAL_TABLET | Freq: Three times a day (TID) | ORAL | 0 refills | Status: DC

## 2017-07-27 NOTE — Telephone Encounter (Signed)
Please review. Thanks!  

## 2017-07-27 NOTE — Telephone Encounter (Signed)
Jody with pharmacy stated pt is having an allergic skin reaction to clarithromycin (BIAXIN) 500 MG tablet. Pt has itchy hives. Also, pt was advised to stop taking nabumetone (RELAFEN) 500 MG tablet while this is going on but a new Rx was sent to pharmacy yesterday and Jeral Fruit is requesting a call back to be advised on both medications. Please advise. Thanks TNP

## 2017-07-27 NOTE — Telephone Encounter (Signed)
Ok to fill nabumetone, but he shouldn't take it until the h. Pylori is eradicated. Can change clarithryomycin to metronidazole 500mg  twice a day for 10 days.

## 2017-07-27 NOTE — Telephone Encounter (Signed)
Pharmacy advised. Left vm for pt to cb.

## 2017-07-28 ENCOUNTER — Telehealth: Payer: Self-pay | Admitting: Family Medicine

## 2017-07-28 ENCOUNTER — Ambulatory Visit: Payer: BC Managed Care – PPO

## 2017-07-28 NOTE — Telephone Encounter (Signed)
Pt is returning call.  IP#[phone]/[id number]

## 2017-07-29 ENCOUNTER — Other Ambulatory Visit: Payer: Self-pay | Admitting: Family Medicine

## 2017-07-29 DIAGNOSIS — G4700 Insomnia, unspecified: Secondary | ICD-10-CM

## 2017-07-29 NOTE — Telephone Encounter (Signed)
Please call in zolpidem  

## 2017-07-29 NOTE — Telephone Encounter (Signed)
Rx called in to pharmacy. 

## 2017-07-31 ENCOUNTER — Ambulatory Visit
Admission: EM | Admit: 2017-07-31 | Discharge: 2017-07-31 | Disposition: A | Discharge status: Home / Self Care | Payer: BC Managed Care – PPO | Attending: Emergency Medicine | Admitting: Emergency Medicine

## 2017-07-31 ENCOUNTER — Encounter: Payer: Self-pay | Admitting: Gynecology

## 2017-07-31 DIAGNOSIS — B370 Candidal stomatitis: Secondary | ICD-10-CM

## 2017-07-31 DIAGNOSIS — T7840XA Allergy, unspecified, initial encounter: Secondary | ICD-10-CM | POA: Diagnosis not present

## 2017-07-31 MED ORDER — NYSTATIN 100000 UNIT/ML MT SUSP: 5.0000 mL | Freq: Four times a day (QID) | OROMUCOSAL | 0 refills | Status: DC

## 2017-07-31 NOTE — ED Triage Notes (Signed)
Per patient started new medication x 4 days ago (Metronidazole 500 mg). Patient stated having symptom of tongue cracking/ burning since taking the medications

## 2017-07-31 NOTE — ED Provider Notes (Addendum)
[facility]    CSN: [id number] Arrival date & time: 07/31/17  1213     History   Chief Complaint Chief Complaint  Patient presents with  . Medication Reaction    HPI Scot Shiraishi is a 61 y.o. male.   HPI  61 year old male presents with a cracking burning and swelling of his tongue since beginning flagyl oral 500 mg 4 days ago. Also noticed a white coating on his tongue. Review of his medical records,: he was diagnosed with H pylori infection. He was placed on proper therapy but had a reaction to the Biaxin was changed to Flagyl 500 mg twice a day for 10 days starting 07/27/2017. Since starting the Flagyl he noticed the swelling and cracking of his tongue. He's had no fever or chills. He's had no difficulty swallowing he has had no throat closing symptoms          Past Medical History:  Diagnosis Date  . Anxiety   . Arthritis   . Colonic constipation   . Depression   . Diabetes mellitus without complication ([place])   . Dysthymic disorder   . Esophageal reflux   . Essential hypertension   . Gastric ulcer   . Genital disorder, male   . H/O Guillain-Barre syndrome   . Hyperlipidemia   . Hypocalcemia   . Hypothyroidism   . Insomnia   . Nausea   . Panic disorder   . Paresthesia of foot   . Psoriatic arthritis ([place])   . Schamberg disease   . Syncope and collapse   . Thoracic aortic aneurysm (HCC)    4.4 cm followed at [facility]  . Thyroid cancer ([place])   . Vitamin D deficiency     Patient Active Problem List   Diagnosis Date Noted  . Neuropathy 01/22/2017  . Left testicular pain 08/18/2016  . 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 Hypothyroidism 08/12/2015  . H/O disease 07/12/2015  . Calcium deficiency disease 07/12/2015  . Burning sensation of the foot 07/12/2015  . 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 Neurocardiogenic syncope 07/12/2015  . Clinical depression 05/17/2015  . Psoriatic arthritis ([place]) 03/12/2015  .  Aneurysm of thoracic aorta ([place]) 01/28/2015  . Chest pain 06/21/2014  . Thoracic aortic aneurysm ([place])   . Essential hypertension   . Aneurysm of ascending aorta (HCC) 04/18/2014  . Adynamia 04/18/2014  . Vitamin D deficiency 07/15/2009  . Colonic constipation 02/24/2008  . Episodic paroxysmal anxiety disorder 02/22/2008  . RESTLESS LEGS SYNDROME 08/31/2007  . Type 2 diabetes mellitus ([place]) 08/30/2007  . Hyperlipemia 08/30/2007  . 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 Headache 08/30/2007  . Insomnia 08/17/2007  . Disorder of male genital organ 07/08/2007  . Acid reflux 11/26/2006  . Gastric ulcer 11/26/2006  . Headache, migraine 11/26/2006  . Depression, neurotic 11/25/2006    Past Surgical History:  Procedure Laterality Date  . CARDIAC CATHETERIZATION  06/07/2014   [facility]  . CARDIAC CATHETERIZATION  2015  . MENISCUS REPAIR Right   . THYROIDECTOMY         Home Medications    Prior to Admission medications   Medication Sig Start Date End Date Taking? Authorizing Provider  amoxicillin (AMOXIL) 500 MG capsule Take 2 capsules (1,000 mg total) 2 (two) times daily by mouth. 07/22/17  Yes Birdie Sons, MD  aspirin EC 81 MG tablet Take 1 tablet (81 mg total) by mouth daily. 06/04/14  Yes Wellington Hampshire, MD  atorvastatin (LIPITOR) 10 MG tablet Take  1 tablet (10 mg total) by mouth every evening. 04/05/17  Yes Birdie Sons, MD  CUTIVATE 0.05 % LOTN APPLY TO AFFECTED AREA TWICE A DAY AS NEEDED FOR RASH 02/04/16  Yes Margarita Rana, MD  dexlansoprazole (DEXILANT) 60 MG capsule Take 1 capsule (60 mg total) daily by mouth. 07/20/17  Yes Birdie Sons, MD  fentaNYL (DURAGESIC - DOSED MCG/HR) 50 MCG/HR Place 1 patch (50 mcg total) onto the skin every other day. 04/05/17  Yes Birdie Sons, MD  JANUVIA 100 MG tablet TAKE ONE TABLET BY MOUTH DAILY 05/14/17  Yes Birdie Sons, MD  levothyroxine (SYNTHROID, LEVOTHROID) 137 MCG tablet Take 1 tablet (137 mcg total) by mouth daily.  01/29/17  Yes Birdie Sons, MD  lisinopril (PRINIVIL,ZESTRIL) 10 MG tablet Take 10 mg by mouth daily.   Yes [provider]  MELATONIN PO Take 1-2 tablets by mouth as needed.   Yes [provider]  metoprolol tartrate (LOPRESSOR) 50 MG tablet TAKE ONE TABLET TWICE DAILY 06/02/17  Yes Birdie Sons, MD  Vitamin D, Ergocalciferol, (DRISDOL) 50000 units CAPS capsule Take 1 capsule (50,000 Units total) by mouth every 7 (seven) days. 03/26/17  Yes Birdie Sons, MD  zolpidem (AMBIEN) 10 MG tablet TAKE 1 TO 2 TABLETS AT BEDTIME AS NEEDED 07/29/17  Yes Birdie Sons, MD  amLODipine (NORVASC) 5 MG tablet TAKE ONE (1) TABLET EACH DAY 10/27/16   Birdie Sons, MD  HYDROcodone-acetaminophen (NORCO/VICODIN) 5-325 MG tablet TAKE ONE TABLET TWICE DAILY AS NEEDED FOR MODERATE PAIN 04/30/17   Birdie Sons, MD  metroNIDAZOLE (FLAGYL) 500 MG tablet Take 1 tablet (500 mg total) 3 (three) times daily by mouth. 07/27/17   Birdie Sons, MD  nystatin (MYCOSTATIN) 100000 UNIT/ML suspension Take 5 mLs (500,000 Units total) 4 (four) times daily by mouth. Swish  around mouth and retain for as long as possible before swallowing. Use for 7-14 days. 07/31/17   Lorin Picket, PA-C  omeprazole (PRILOSEC) 20 MG capsule Take 1 capsule (20 mg total) 2 (two) times daily by mouth. 07/22/17   Birdie Sons, MD  testosterone Renae Gloss) 4 MG/24HR PT24 patch Place 1 patch onto the skin daily. 05/24/17   Birdie Sons, MD    Family History Family History  Problem Relation Age of Onset  . Hyperlipidemia Mother   . Heart attack Father 74  . Hypertension Father   . Hyperlipidemia Father   . Heart failure Father   . Prostate cancer Neg Hx   . Kidney cancer Neg Hx   . Bladder Cancer Neg Hx   . Kidney disease Neg Hx     Social History Social History   Tobacco Use  . Smoking status: Never Smoker  . Smokeless tobacco: Never Used  Substance Use Topics  . Alcohol use: No  . Drug use: No      Allergies   Influenza vaccines; Declomycin [demeclocycline]; Demeclocycline hcl; Ginger; Glipizide; Metformin and related; Remicade [infliximab]; Suvorexant; and Pioglitazone   Review of Systems Review of Systems  Constitutional: Positive for appetite change. Negative for activity change, chills, fatigue and fever.  HENT: Positive for mouth sores. Negative for drooling.   All other systems reviewed and are negative.    Physical Exam Triage Vital Signs ED Triage Vitals [07/31/17 1238]  Enc Vitals Group     BP 118/77     Pulse Rate 73     Resp 16     Temp 98.4 F (36.9  C)     Temp Source Oral     SpO2 97 %     Weight 229 lb (103.9 kg)     Height 5\' 11"  (1.803 m)     Head Circumference      Peak Flow      Pain Score      Pain Loc      Pain Edu?      Excl. in [place]?    No data found.  Updated Vital Signs BP 118/77 (BP Location: Left Arm)   Pulse 73   Temp 98.4 F (36.9 C) (Oral)   Resp 16   Ht 5\' 11"  (1.803 m)   Wt 229 lb (103.9 kg)   SpO2 97%   BMI 31.94 kg/m   Visual Acuity Right Eye Distance:   Left Eye Distance:   Bilateral Distance:    Right Eye Near:   Left Eye Near:    Bilateral Near:     Physical Exam  Constitutional: He is oriented to person, place, and time.  HENT:  Head: Normocephalic.  Right Ear: External ear normal.  Left Ear: External ear normal.  Nose: Nose normal.  Eyes: Pupils are equal, round, and reactive to light. Right eye exhibits no discharge. Left eye exhibits no discharge.  Neck: Normal range of motion. Neck supple.  Pulmonary/Chest: No stridor. He has no wheezes.  Musculoskeletal: Normal range of motion.  Lymphadenopathy:    He has no cervical adenopathy.  Neurological: He is alert and oriented to person, place, and time.  Skin: Skin is warm and dry.  Psychiatric: He has a normal mood and affect. His behavior is normal. Judgment and thought content normal.  Nursing note and vitals reviewed.    UC Treatments /  Results  Labs (all labs ordered are listed, but only abnormal results are displayed) Labs Reviewed - No data to display  EKG  EKG Interpretation None       Radiology No results found.  Procedures Procedures (including critical care time)  Medications Ordered in UC Medications - No data to display   Initial Impression / Assessment and Plan / UC Course  I have reviewed the triage vital signs and the nursing notes.  Pertinent labs & imaging results that were available during my care of the patient were reviewed by me and considered in my medical decision making (see chart for details).     Plan: 1. Test/x-ray results and diagnosis reviewed with patient 2. rx as per orders; risks, benefits, potential side effects reviewed with patient 3. Recommend supportive treatment with continuing medications as prescribed by his primary care physician Dr. Caryn Section. he will contact Dr. Caryn Section on Monday for further instructions. I have given him instructions on the nystatin swish and swallow. He will continue this for 7-14 days as advised by Dr. Caryn Section. 4. F/u prn if symptoms worsen or don't improve   Final Clinical Impressions(s) / UC Diagnoses   Final diagnoses:  Thrush, oral    ED Discharge Orders        Ordered    nystatin (MYCOSTATIN) 100000 UNIT/ML suspension  4 times daily,   Status:  Discontinued     07/31/17 1317    nystatin (MYCOSTATIN) 100000 UNIT/ML suspension  4 times daily     07/31/17 1323       Controlled Substance Prescriptions [place] Controlled Substance Registry consulted? Not Applicable   Lorin Picket, PA-C 07/31/17 1347    Lorin Picket, PA-C 07/31/17 1348

## 2017-08-01 ENCOUNTER — Other Ambulatory Visit: Payer: Self-pay | Admitting: Family Medicine

## 2017-08-01 DIAGNOSIS — L4050 Arthropathic psoriasis, unspecified: Secondary | ICD-10-CM

## 2017-08-02 MED ORDER — FENTANYL 50 MCG/HR TD PT72: 50.0000 ug | MEDICATED_PATCH | TRANSDERMAL | 0 refills | Status: DC

## 2017-08-02 NOTE — Telephone Encounter (Signed)
Please advise patient Fentanyl prescription has been sent electronically to [facility]

## 2017-08-02 NOTE — Telephone Encounter (Signed)
Patient was advised.  

## 2017-08-06 ENCOUNTER — Encounter: Payer: Self-pay | Admitting: Family Medicine

## 2017-08-06 ENCOUNTER — Ambulatory Visit: Payer: BC Managed Care – PPO | Admitting: Family Medicine

## 2017-08-06 VITALS — BP 122/70 | HR 58 | Temp 98.5°F | Resp 14 | Wt 231.0 lb

## 2017-08-06 DIAGNOSIS — E119 Type 2 diabetes mellitus without complications: Secondary | ICD-10-CM | POA: Diagnosis not present

## 2017-08-06 DIAGNOSIS — B9681 Helicobacter pylori [H. pylori] as the cause of diseases classified elsewhere: Secondary | ICD-10-CM | POA: Diagnosis not present

## 2017-08-06 DIAGNOSIS — K2970 Gastritis, unspecified, without bleeding: Secondary | ICD-10-CM | POA: Diagnosis not present

## 2017-08-06 DIAGNOSIS — G4700 Insomnia, unspecified: Secondary | ICD-10-CM

## 2017-08-06 LAB — POCT GLYCOSYLATED HEMOGLOBIN (HGB A1C): HEMOGLOBIN A1C: 6.5

## 2017-08-06 MED ORDER — OMEPRAZOLE 20 MG PO CPDR: 20.0000 mg | DELAYED_RELEASE_CAPSULE | Freq: Every day | ORAL | 0 refills | Status: DC

## 2017-08-06 MED ORDER — ZOLPIDEM TARTRATE 10 MG PO TABS: 10.0000 mg | ORAL_TABLET | Freq: Every evening | ORAL | 5 refills | Status: DC | PRN

## 2017-08-06 NOTE — Patient Instructions (Addendum)
Return to lab for h. Pylori breath test around November 78EU, 2353    Helicobacter Pylori Infection Helicobacter pylori infection is an infection in the stomach that is caused by the Helicobacter pylori (H. pylori) bacteria. This type of bacteria often lives in the lining of the stomach. The infection can cause ulcers and irritation (gastritis) in some people. It is the most common cause of ulcers in the stomach (gastric ulcer) and in the upper part of the intestine (duodenal ulcer). Having this infection may also increase the risk of stomach cancer and a type of white blood cell cancer (lymphoma) that affects the stomach. What are the causes? H. pylori is a type of bacteria that is often found in the stomachs of healthy people. The bacteria may be passed from person to person through contact with stool or saliva. It is not known why some people develop ulcers, gastritis, or cancer from the infection. What increases the risk? This condition is more likely to develop in people who:  Have family members with the infection.  Live with many other people, such as in a dormitory.  Are of African, Hispanic, or Asian descent.  What are the signs or symptoms? Most people with this infection do not have symptoms. If you do have symptoms, they may include:  Heartburn.  Stomach pain.  Nausea.  Vomiting.  Blood-tinged vomit.  Loss of appetite.  Bad breath.  How is this diagnosed? This condition may be diagnosed based on your symptoms, a physical exam, and various tests. Tests may include:  Blood tests or stool tests to check for the proteins (antibodies) that your body may produce in response to the bacteria. These tests are the best way to confirm the diagnosis.  A breath test to check for the type of gas that the H. pylori bacteria release after breaking down a substance called urea. For the test, you are asked to drink urea. This test is often done after treatment in order to find out if  the treatment worked.  A procedure in which a thin, flexible tube with a tiny camera at the end is placed into your stomach and upper intestine (upper endoscopy). Your health care provider may also take tissue samples (biopsy) to test for H. pylori and cancer.  How is this treated? Treatment for this condition usually involves taking a combination of medicines (triple therapy) for a couple of weeks. Triple therapy includes one medicine to reduce the acid in your stomach and two types of antibiotic medicines. Many drug combinations have been approved for treatment. Treatment usually kills the H. pylori and reduces your risk of cancer. You may need to be tested for H. pylori again after treatment. In some cases, the treatment may need to be repeated. Follow these instructions at home:  Take over-the-counter and prescription medicines only as told by your health care provider.  Take your antibiotics as told by your health care provider. Do not stop taking the antibiotics even if you start to feel better.  You can do all your usual activities and eat what you usually do.  Take steps to prevent future infections: ? Wash your hands often. ? Make sure the food you eat has been properly prepared. ? Drink water only from clean sources.  Keep all follow-up visits as told by your health care provider. This is important. Contact a health care provider if:  Your symptoms do not get better.  Your symptoms return after treatment. This information is not intended to replace  advice given to you by your health care provider. Make sure you discuss any questions you have with your health care provider. Document Released: 12/23/2015 Document Revised: 02/06/2016 Document Reviewed: 09/12/2014 Elsevier Interactive Patient Education  2018 [employer].

## 2017-08-06 NOTE — Progress Notes (Signed)
Patient: Keith Carpenter Male    DOB: Sep 15, 1955   62 y.o.   MRN: [id number] Visit Date: 08/06/2017  Today's Provider: Lelon Huh, MD   Chief Complaint  Patient presents with  . Diabetes   Subjective:    HPI  Diabetes Mellitus Type II, Follow-up:   Lab Results  Component Value Date   HGBA1C 6.7 (H) 03/22/2017   HGBA1C 7.0 11/17/2016   HGBA1C 8.0 08/18/2016    Last seen for diabetes 4 months ago.  Management since then includes none. He reports good compliance with treatment. He is not having side effects.  Current symptoms include none and have been unchanged. Home blood sugar records: average 125  Episodes of hypoglycemia? no   Current Insulin Regimen: n/a Most Recent Eye Exam: 08/2016 Weight trend: stable Current exercise: walking  Pertinent Labs:    Component Value Date/Time   CHOL 171 03/22/2017 0951   CHOL 220 (H) 02/23/2012 0416   TRIG 117 03/22/2017 0951   TRIG 104 02/23/2012 0416   HDL 39 (L) 03/22/2017 0951   HDL 36 (L) 02/23/2012 0416   LDLCALC 109 (H) 03/22/2017 0951   LDLCALC 163 (H) 02/23/2012 0416   CREATININE 0.93 07/20/2017 1135    Wt Readings from Last 3 Encounters:  08/06/17 231 lb (104.8 kg)  07/31/17 229 lb (103.9 kg)  07/20/17 229 lb (103.9 kg)    ------------------------------------------------------------------------ Follow up abdominal pain Seen 07-20-2017 with fairly severe abdominal , labs were positive for h. Pylori Ab and was started on clarithromycin, omeprazole, and amoxicillin. Change from clarithry to metronidazole due to rash from Clarithro. He is still having occasionally episodes of abdominal pain, but has greatly improved since starting antibiotic. He is also still using the Nystatin swish and swallow for his thrust.   He also requests new prescription to help sleep. He states he doesn't sleep at all when he takes 10mg  zolpidem, but sleep well when he takes 2.     Depression screen [facility] 2/9 08/06/2017  11/09/2016  Decreased Interest 2 0  Down, Depressed, Hopeless 2 0  PHQ - 2 Score 4 0  Altered sleeping 3 -  Tired, decreased energy 3 -  Change in appetite 2 -  Feeling bad or failure about yourself  1 -  Trouble concentrating 1 -  Moving slowly or fidgety/restless 2 -  Suicidal thoughts 0 -  PHQ-9 Score 16 -  Difficult doing work/chores Not difficult at all -        Allergies  Allergen Reactions  . Influenza Vaccines Other (See Comments)    Guillain Barre  . Declomycin [Demeclocycline]   . Demeclocycline Hcl   . Ginger   . Glipizide     Nausea   . Metformin And Related Nausea Only    Metformin IR 500 BID  . Remicade [Infliximab]   . Suvorexant Other (See Comments)    vision changes  . Pioglitazone Itching, Swelling and Rash     Current Outpatient Medications:  .  fentaNYL (DURAGESIC - DOSED MCG/HR) 50 MCG/HR, Place 1 patch (50 mcg total) every other day onto the skin., Disp: 15 patch, Rfl: 0 .  JANUVIA 100 MG tablet, TAKE ONE TABLET BY MOUTH DAILY, Disp: 30 tablet, Rfl: 12 .  levothyroxine (SYNTHROID, LEVOTHROID) 137 MCG tablet, Take 1 tablet (137 mcg total) by mouth daily., Disp: 90 tablet, Rfl: 3 .  lisinopril (PRINIVIL,ZESTRIL) 10 MG tablet, Take 10 mg by mouth daily., Disp: , Rfl:  .  metoprolol tartrate (LOPRESSOR) 50 MG tablet, TAKE ONE TABLET TWICE DAILY, Disp: 60 tablet, Rfl: 5 .  metroNIDAZOLE (FLAGYL) 500 MG tablet, Take 1 tablet (500 mg total) 3 (three) times daily by mouth., Disp: 20 tablet, Rfl: 0 .  nystatin (MYCOSTATIN) 100000 UNIT/ML suspension, Take 5 mLs (500,000 Units total) 4 (four) times daily by mouth. Swish  around mouth and retain for as long as possible before swallowing. Use for 7-14 days., Disp: 280 mL, Rfl: 0 .  omeprazole (PRILOSEC) 20 MG capsule, Take 1 capsule (20 mg total) 2 (two) times daily by mouth., Disp: 28 capsule, Rfl: 0 .  testosterone (ANDRODERM) 4 MG/24HR PT24 patch, Place 1 patch onto the skin daily., Disp: 30 patch, Rfl: 3 .   zolpidem (AMBIEN) 10 MG tablet, TAKE 1 TO 2 TABLETS AT BEDTIME AS NEEDED, Disp: 35 tablet, Rfl: 5 .  amLODipine (NORVASC) 5 MG tablet, TAKE ONE (1) TABLET EACH DAY (Patient not taking: Reported on 08/06/2017), Disp: 30 tablet, Rfl: 6 .  amoxicillin (AMOXIL) 500 MG capsule, Take 2 capsules (1,000 mg total) 2 (two) times daily by mouth., Disp: 56 capsule, Rfl: 0 .  aspirin EC 81 MG tablet, Take 1 tablet (81 mg total) by mouth daily. (Patient not taking: Reported on 08/06/2017), Disp: 90 tablet, Rfl: 3 .  atorvastatin (LIPITOR) 10 MG tablet, Take 1 tablet (10 mg total) by mouth every evening. (Patient not taking: Reported on 08/06/2017), Disp: 90 tablet, Rfl: 3 .  CUTIVATE 0.05 % LOTN, APPLY TO AFFECTED AREA TWICE A DAY AS NEEDED FOR RASH (Patient not taking: Reported on 08/06/2017), Disp: 120 mL, Rfl: 5 .  dexlansoprazole (DEXILANT) 60 MG capsule, Take 1 capsule (60 mg total) daily by mouth., Disp: 5 capsule, Rfl: 0 .  HYDROcodone-acetaminophen (NORCO/VICODIN) 5-325 MG tablet, TAKE ONE TABLET TWICE DAILY AS NEEDED FOR MODERATE PAIN (Patient not taking: Reported on 08/06/2017), Disp: 60 tablet, Rfl: 0 .  MELATONIN PO, Take 1-2 tablets by mouth as needed., Disp: , Rfl:  .  Vitamin D, Ergocalciferol, (DRISDOL) 50000 units CAPS capsule, Take 1 capsule (50,000 Units total) by mouth every 7 (seven) days. (Patient not taking: Reported on 08/06/2017), Disp: 12 capsule, Rfl: 4  Review of Systems  Constitutional: Negative.   HENT: Negative.   Eyes: Negative.   Respiratory: Negative.   Cardiovascular: Negative.   Gastrointestinal: Positive for abdominal pain.  Endocrine: Negative.   Genitourinary: Negative.   Musculoskeletal: Negative.   Skin: Negative.   Allergic/Immunologic: Negative.   Neurological: Negative.   Hematological: Negative.   Psychiatric/Behavioral: Negative.     Social History   Tobacco Use  . Smoking status: Never Smoker  . Smokeless tobacco: Never Used  Substance Use Topics  .  Alcohol use: No   Objective:   BP 122/70 (BP Location: Left Arm, Patient Position: Sitting, Cuff Size: Large)   Pulse (!) 58   Temp 98.5 F (36.9 C) (Oral)   Resp 14   Wt 231 lb (104.8 kg)   SpO2 95%   BMI 32.22 kg/m  Vitals:   08/06/17 1114  BP: 122/70  Pulse: (!) 58  Resp: 14  Temp: 98.5 F (36.9 C)  TempSrc: Oral  SpO2: 95%  Weight: 231 lb (104.8 kg)     Physical Exam   General Appearance:    Alert, cooperative, no distress  Eyes:    PERRL, conjunctiva/corneas clear, EOM's intact       Lungs:     Clear to auscultation bilaterally, respirations unlabored  Heart:  Regular rate and rhythm  Neurologic:   Awake, alert, oriented x 3. No apparent focal neurological           defect.       Results for orders placed or performed in visit on 08/06/17  POCT HgB A1C  Result Value Ref Range   Hemoglobin A1C 6.5        Assessment & Plan:     1. Type 2 diabetes mellitus without complication, without long-term current use of insulin (HCC) Well controlled.  Continue current medications.   - POCT HgB A1C  2. Helicobacter pylori gastritis Return to lab for [facility] after finishing antibiotic.  - H. pylori breath test  3. Insomnia, unspecified type He states he can only sleep when he takes 2 zolpidem. Advised to limit to one at night as much as possible. New prescription written.  - zolpidem (AMBIEN) 10 MG tablet; Take 1-2 tablets (10-20 mg total) by mouth at bedtime as needed.  Dispense: 60 tablet; Refill: [place], MD  [facility]

## 2017-08-12 ENCOUNTER — Encounter: Payer: Self-pay | Admitting: Family Medicine

## 2017-08-12 LAB — H. PYLORI BREATH TEST: H. pylori Breath Test: NOT DETECTED

## 2017-08-13 ENCOUNTER — Telehealth: Payer: Self-pay

## 2017-08-13 NOTE — Telephone Encounter (Signed)
Patient called with complain of feeling bad.  States he is hardly able to go, feels like he is moving in slow motion.  C/O dizziness, glucose readings that are up and down, difficulty moving, no energy and his speech sounded las though he was having a hard time getting his words out although the patient denied any difficulty speaking.  Patient is by himself so he was instructed to call rescue and have them take him to the ER to be evaluated.

## 2017-08-18 ENCOUNTER — Encounter: Payer: Self-pay | Admitting: Family Medicine

## 2017-08-18 ENCOUNTER — Ambulatory Visit: Payer: BC Managed Care – PPO | Admitting: Family Medicine

## 2017-08-18 VITALS — BP 132/72 | HR 71 | Temp 99.6°F | Resp 16 | Wt 223.2 lb

## 2017-08-18 DIAGNOSIS — K2970 Gastritis, unspecified, without bleeding: Secondary | ICD-10-CM | POA: Diagnosis not present

## 2017-08-18 DIAGNOSIS — R5383 Other fatigue: Secondary | ICD-10-CM | POA: Diagnosis not present

## 2017-08-18 DIAGNOSIS — B9681 Helicobacter pylori [H. pylori] as the cause of diseases classified elsewhere: Secondary | ICD-10-CM | POA: Diagnosis not present

## 2017-08-18 MED ORDER — DULOXETINE HCL 30 MG PO CPEP: 30.0000 mg | ORAL_CAPSULE | Freq: Every day | ORAL | 1 refills | Status: DC

## 2017-08-18 NOTE — Progress Notes (Signed)
Patient ID: Keith Carpenter, male   DOB: Nov 10, 1955, 61 y.o.   MRN: [id number]        Patient: Keith Carpenter Male    DOB: 08-Feb-1956   61 y.o.   MRN: [id number] Visit Date: 08/18/2017  Today's Provider: Lelon Huh, MD   Chief Complaint  Patient presents with  . Advice Only    Patient comes in office today to review over medication list with doctor, patient states that at last visit 08/06/17 he was screen for H.Pylori and was taken off 4 prescriptions  . Hyperglycemia  . Hypertension    Patient reports since 07/07/17 he has had elevated blood pressure readings at home. Patient reports systolic readings ranging from 607-371 and diastolic readings at home 90-100s.   Subjective:    Hyperglycemia  This is a new (patient reports readings at home non fasting as low as 65 and high 105) problem. The current episode started 1 to 4 weeks ago. The problem occurs daily. The problem has been gradually worsening. Associated symptoms include fatigue, headaches, nausea, urinary symptoms (frequency of urination in PM, per patient every hour), vertigo, a visual change (left eye) and weakness. Pertinent negatives include no abdominal pain, anorexia, arthralgias, change in bowel habit, chest pain, chills, congestion, coughing, diaphoresis, fever, joint swelling, myalgias, neck pain (patient denies numbness but reports tingling), numbness, rash, sore throat, swollen glands or vomiting. Nothing aggravates the symptoms. The treatment provided no relief (patient reports that he feels that Januvia is increasing symptoms of hyperglycemia).  Hypertension  The current episode started more than 1 month ago. The problem is unchanged. The problem is uncontrolled. Associated symptoms include headaches. Pertinent negatives include no chest pain or neck pain (patient denies numbness but reports tingling). Risk factors for coronary artery disease include diabetes mellitus. Past treatments include ACE inhibitors. The  current treatment provides no improvement.   Still has poor appetite since being treated for h. Pylori, but is eating. Has no abdominal pain anymore, even after eating. still on 20mg  omeprazole daily. Was off of several medications when he had h. Pylori flare. Has restarted several medications. Started back on levothyroxine about a week ago.   Took 1/2 Januvia yesterday and sugar dropped to  60 and he felt very hypoglycemic.   Has been wearing his testosterone patch on most days.     Allergies  Allergen Reactions  . Influenza Vaccines Other (See Comments)    Guillain Barre  . Declomycin [Demeclocycline]   . Demeclocycline Hcl   . Ginger   . Glipizide     Nausea   . Metformin And Related Nausea Only    Metformin IR 500 BID  . Other     Other reaction(s): Other (See Comments) Uncoded Allergy. Allergen: Other Allergy: See Patient Chart for Details, Other Reaction: Other reaction  . Remicade [Infliximab]   . Suvorexant Other (See Comments)    vision changes  . Clarithromycin Rash  . Pioglitazone Itching, Swelling and Rash     Current Outpatient Medications:  .  amLODipine (NORVASC) 5 MG tablet, TAKE ONE (1) TABLET EACH DAY, Disp: 30 tablet, Rfl: 6 .  atorvastatin (LIPITOR) 10 MG tablet, Take 1 tablet (10 mg total) by mouth every evening., Disp: 90 tablet, Rfl: 3 .  fentaNYL (DURAGESIC - DOSED MCG/HR) 50 MCG/HR, Place 1 patch (50 mcg total) every other day onto the skin., Disp: 15 patch, Rfl: 0 .  JANUVIA 100 MG tablet, TAKE ONE TABLET BY MOUTH DAILY, Disp: 30 tablet, Rfl: 12 .  levothyroxine (SYNTHROID, LEVOTHROID) 137 MCG tablet, Take 1 tablet (137 mcg total) by mouth daily., Disp: 90 tablet, Rfl: 3 .  metoprolol tartrate (LOPRESSOR) 50 MG tablet, TAKE ONE TABLET TWICE DAILY, Disp: 60 tablet, Rfl: 5 .  omeprazole (PRILOSEC) 20 MG capsule, Take 1 capsule (20 mg total) by mouth daily., Disp: 30 capsule, Rfl: 0 .  zolpidem (AMBIEN) 10 MG tablet, Take 1-2 tablets (10-20 mg total) by  mouth at bedtime as needed., Disp: 60 tablet, Rfl: 5 .  ANDRODERM 4 MG/24HR PT24 patch, , Disp: , Rfl:  .  aspirin EC 81 MG tablet, Take 1 tablet (81 mg total) by mouth daily. (Patient not taking: Reported on 08/06/2017), Disp: 90 tablet, Rfl: 3 .  CUTIVATE 0.05 % LOTN, APPLY TO AFFECTED AREA TWICE A DAY AS NEEDED FOR RASH (Patient not taking: Reported on 08/06/2017), Disp: 120 mL, Rfl: 5 .  gabapentin (NEURONTIN) 600 MG tablet, , Disp: , Rfl:  .  HYDROcodone-acetaminophen (NORCO/VICODIN) 5-325 MG tablet, TAKE ONE TABLET TWICE DAILY AS NEEDED FOR MODERATE PAIN (Patient not taking: Reported on 08/06/2017), Disp: 60 tablet, Rfl: 0 .  MELATONIN PO, Take 1-2 tablets by mouth as needed., Disp: , Rfl:  .  nabumetone (RELAFEN) 500 MG tablet, , Disp: , Rfl:  .  nystatin (MYCOSTATIN) 100000 UNIT/ML suspension, Take 5 mLs (500,000 Units total) 4 (four) times daily by mouth. Swish  around mouth and retain for as long as possible before swallowing. Use for 7-14 days. (Patient not taking: Reported on 08/18/2017), Disp: 280 mL, Rfl: 0 .  testosterone (ANDRODERM) 4 MG/24HR PT24 patch, Place 1 patch onto the skin daily. (Patient not taking: Reported on 08/18/2017), Disp: 30 patch, Rfl: 3 .  Vitamin D, Ergocalciferol, (DRISDOL) 50000 units CAPS capsule, Take 1 capsule (50,000 Units total) by mouth every 7 (seven) days. (Patient not taking: Reported on 08/06/2017), Disp: 12 capsule, Rfl: 4  Review of Systems  Constitutional: Positive for fatigue. Negative for chills, diaphoresis and fever.  HENT: Negative for congestion and sore throat.   Respiratory: Negative for cough.   Cardiovascular: Negative for chest pain.  Gastrointestinal: Positive for nausea. Negative for abdominal pain, anorexia, change in bowel habit and vomiting.  Musculoskeletal: Negative for arthralgias, joint swelling, myalgias and neck pain (patient denies numbness but reports tingling).  Skin: Negative for rash.  Neurological: Positive for vertigo,  weakness and headaches. Negative for numbness.    Social History   Tobacco Use  . Smoking status: Never Smoker  . Smokeless tobacco: Never Used  Substance Use Topics  . Alcohol use: No   Objective:    Vitals:   08/18/17 1629 08/18/17 1714  BP: (!) 140/102 132/72  Pulse: 71   Resp: 16   Temp: 99.6 F (37.6 C)   TempSrc: Oral   SpO2: 95%   Weight: 223 lb 3.2 oz (101.2 kg)      Physical Exam  General Appearance:    Alert, cooperative, no distress  Eyes:    PERRL, conjunctiva/corneas clear, EOM's intact       Lungs:     Clear to auscultation bilaterally, respirations unlabored  Heart:    Regular rate and rhythm  Abdomen:   bowel sounds present and normal in all 4 quadrants, soft, round, nontender or nondistended. No CVA tenderness        Assessment & Plan:     1. Helicobacter pylori gastritis Abdominal pains have completely resolved and follow up breath test negative. Strongly suspect he had peptic ulcer which is  likely still healing. Stay on PPI and avoid NSAIDS until further notice.   2. Other fatigue Likely multifactorial. Probably still healing from PUD as above. He did abruptly stop several of his chronic medications. May be some withdrawal from Cymbalta and will restart this at 30mg  a day. Has had some hypoglycemic symptoms probably due to poor appetite. Put Januvia on hold for the time being. He took himself of gabapentin which will keep on hold.   Return in about 4 weeks (around 09/15/2017).        Lelon Huh, MD  [facility]

## 2017-08-18 NOTE — Patient Instructions (Addendum)
   Start taking a Men's multivitamin once a day.   Keep Januvia on hold until further notice.    A prescription for duloxetine 30mg  has been sent to [facility] for you to pick up

## 2017-08-24 ENCOUNTER — Emergency Department: Payer: BC Managed Care – PPO

## 2017-08-24 ENCOUNTER — Other Ambulatory Visit: Payer: Self-pay

## 2017-08-24 ENCOUNTER — Emergency Department
Admission: EM | Admit: 2017-08-24 | Discharge: 2017-08-24 | Disposition: A | Discharge status: Home / Self Care | Payer: BC Managed Care – PPO | Attending: Emergency Medicine | Admitting: Emergency Medicine

## 2017-08-24 DIAGNOSIS — G43109 Migraine with aura, not intractable, without status migrainosus: Secondary | ICD-10-CM | POA: Diagnosis not present

## 2017-08-24 DIAGNOSIS — R519 Headache, unspecified: Secondary | ICD-10-CM

## 2017-08-24 DIAGNOSIS — Z7984 Long term (current) use of oral hypoglycemic drugs: Secondary | ICD-10-CM | POA: Insufficient documentation

## 2017-08-24 DIAGNOSIS — R531 Weakness: Secondary | ICD-10-CM

## 2017-08-24 DIAGNOSIS — E119 Type 2 diabetes mellitus without complications: Secondary | ICD-10-CM | POA: Diagnosis not present

## 2017-08-24 DIAGNOSIS — T5811XA Toxic effect of carbon monoxide from utility gas, accidental (unintentional), initial encounter: Secondary | ICD-10-CM | POA: Diagnosis not present

## 2017-08-24 DIAGNOSIS — R51 Headache: Secondary | ICD-10-CM | POA: Diagnosis present

## 2017-08-24 DIAGNOSIS — R079 Chest pain, unspecified: Secondary | ICD-10-CM | POA: Diagnosis not present

## 2017-08-24 DIAGNOSIS — M6281 Muscle weakness (generalized): Secondary | ICD-10-CM | POA: Diagnosis not present

## 2017-08-24 DIAGNOSIS — Z7729 Contact with and (suspected ) exposure to other hazardous substances: Secondary | ICD-10-CM

## 2017-08-24 DIAGNOSIS — E039 Hypothyroidism, unspecified: Secondary | ICD-10-CM | POA: Diagnosis not present

## 2017-08-24 DIAGNOSIS — Z7982 Long term (current) use of aspirin: Secondary | ICD-10-CM | POA: Insufficient documentation

## 2017-08-24 DIAGNOSIS — I10 Essential (primary) hypertension: Secondary | ICD-10-CM | POA: Diagnosis not present

## 2017-08-24 DIAGNOSIS — Z79899 Other long term (current) drug therapy: Secondary | ICD-10-CM | POA: Diagnosis not present

## 2017-08-24 LAB — COMPREHENSIVE METABOLIC PANEL WITH GFR
ALT: 25 U/L (ref 17–63)
AST: 26 U/L (ref 15–41)
Albumin: 4 g/dL (ref 3.5–5.0)
Alkaline Phosphatase: 80 U/L (ref 38–126)
Anion gap: 13 (ref 5–15)
BUN: 20 mg/dL (ref 6–20)
CO2: 23 mmol/L (ref 22–32)
Calcium: 8.8 mg/dL — ABNORMAL LOW (ref 8.9–10.3)
Chloride: 99 mmol/L — ABNORMAL LOW (ref 101–111)
Creatinine, Ser: 0.83 mg/dL (ref 0.61–1.24)
GFR calc Af Amer: >60 mL/min (ref >60)
GFR calc non Af Amer: >60 mL/min (ref >60)
Glucose, Bld: 147 mg/dL — ABNORMAL HIGH (ref 65–99)
Potassium: 3.9 mmol/L (ref 3.5–5.1)
Sodium: 135 mmol/L (ref 135–145)
Total Bilirubin: 0.5 mg/dL (ref 0.3–1.2)
Total Protein: 7.9 g/dL (ref 6.5–8.1)

## 2017-08-24 LAB — URINALYSIS, COMPLETE (UACMP) WITH MICROSCOPIC
Bacteria, UA: NONE SEEN
Bilirubin Urine: NEGATIVE
Glucose, UA: NEGATIVE mg/dL
Hgb urine dipstick: NEGATIVE
Ketones, ur: NEGATIVE mg/dL
Leukocytes, UA: NEGATIVE
Nitrite: NEGATIVE
Protein, ur: NEGATIVE mg/dL
Specific Gravity, Urine: 1.013 (ref 1.005–1.030)
pH: 7 (ref 5.0–8.0)

## 2017-08-24 LAB — URINE DRUG SCREEN, QUALITATIVE (ARMC ONLY)
Amphetamines, Ur Screen: NOT DETECTED
Barbiturates, Ur Screen: NOT DETECTED
Benzodiazepine, Ur Scrn: NOT DETECTED
Cannabinoid 50 Ng, Ur ~~LOC~~: NOT DETECTED
Cocaine Metabolite,Ur ~~LOC~~: NOT DETECTED
MDMA (Ecstasy)Ur Screen: NOT DETECTED
Methadone Scn, Ur: NOT DETECTED
Opiate, Ur Screen: NOT DETECTED
Phencyclidine (PCP) Ur S: NOT DETECTED
Tricyclic, Ur Screen: NOT DETECTED

## 2017-08-24 LAB — CBC WITH DIFFERENTIAL/PLATELET
Basophils Absolute: 0 K/uL (ref 0–0.1)
Basophils Relative: 0 %
Eosinophils Absolute: 0.1 K/uL (ref 0–0.7)
Eosinophils Relative: 1 %
HCT: 42.5 % (ref 40.0–52.0)
Hemoglobin: 14.8 g/dL (ref 13.0–18.0)
Lymphocytes Relative: 14 %
Lymphs Abs: 1.2 K/uL (ref 1.0–3.6)
MCH: 29.5 pg (ref 26.0–34.0)
MCHC: 34.9 g/dL (ref 32.0–36.0)
MCV: 84.6 fL (ref 80.0–100.0)
Monocytes Absolute: 0.6 K/uL (ref 0.2–1.0)
Monocytes Relative: 8 %
Neutro Abs: 6.7 K/uL — ABNORMAL HIGH (ref 1.4–6.5)
Neutrophils Relative %: 77 %
Platelets: 266 K/uL (ref 150–440)
RBC: 5.03 MIL/uL (ref 4.40–5.90)
RDW: 13.2 % (ref 11.5–14.5)
WBC: 8.6 K/uL (ref 3.8–10.6)

## 2017-08-24 LAB — COOXEMETRY PANEL
Carboxyhemoglobin: 2.2 % — ABNORMAL HIGH (ref 0.5–1.5)
Methemoglobin: 1.5 % (ref 0.0–1.5)
O2 Saturation: 83.4 %

## 2017-08-24 LAB — T4, FREE: Free T4: 1.28 ng/dL — ABNORMAL HIGH (ref 0.61–1.12)

## 2017-08-24 LAB — TSH: TSH: 1.689 u[IU]/mL (ref 0.350–4.500)

## 2017-08-24 LAB — PROTIME-INR
INR: 0.89
Prothrombin Time: 12 seconds (ref 11.4–15.2)

## 2017-08-24 LAB — TROPONIN I: Troponin I: <0.03 ng/mL (ref <0.03)

## 2017-08-24 LAB — ETHANOL: Alcohol, Ethyl (B): <10 mg/dL (ref <10)

## 2017-08-24 LAB — LACTIC ACID, PLASMA: Lactic Acid, Venous: 1.3 mmol/L (ref 0.5–1.9)

## 2017-08-24 LAB — LIPASE, BLOOD: LIPASE: 38 U/L (ref 11–51)

## 2017-08-24 MED ORDER — SODIUM CHLORIDE 0.9 % IV BOLUS (SEPSIS)
1000.0000 mL | Freq: Once | INTRAVENOUS | Status: AC
  Administered 2017-08-24: 1000 mL via INTRAVENOUS

## 2017-08-24 MED ORDER — METOCLOPRAMIDE HCL 5 MG/ML IJ SOLN
INTRAMUSCULAR | Status: AC
  Administered 2017-08-24: 10 mg via INTRAVENOUS
  Filled 2017-08-24: qty 2

## 2017-08-24 MED ORDER — DIPHENHYDRAMINE HCL 50 MG/ML IJ SOLN
INTRAMUSCULAR | Status: AC
  Administered 2017-08-24: 25 mg via INTRAVENOUS
  Filled 2017-08-24: qty 1

## 2017-08-24 MED ORDER — KETOROLAC TROMETHAMINE 30 MG/ML IJ SOLN
30.0000 mg | Freq: Once | INTRAMUSCULAR | Status: AC
  Administered 2017-08-24: 30 mg via INTRAVENOUS

## 2017-08-24 MED ORDER — DIPHENHYDRAMINE HCL 50 MG/ML IJ SOLN
25.0000 mg | Freq: Once | INTRAMUSCULAR | Status: AC
  Administered 2017-08-24: 25 mg via INTRAVENOUS

## 2017-08-24 MED ORDER — KETOROLAC TROMETHAMINE 30 MG/ML IJ SOLN
INTRAMUSCULAR | Status: AC
  Administered 2017-08-24: 30 mg via INTRAVENOUS
  Filled 2017-08-24: qty 1

## 2017-08-24 MED ORDER — METOCLOPRAMIDE HCL 5 MG/ML IJ SOLN
10.0000 mg | Freq: Once | INTRAMUSCULAR | Status: AC
  Administered 2017-08-24: 10 mg via INTRAVENOUS

## 2017-08-24 NOTE — ED Notes (Signed)
ED Provider at bedside. 

## 2017-08-24 NOTE — ED Provider Notes (Signed)
[facility] Emergency Department Provider Note ____________________________________________   I have reviewed the triage vital signs and the triage nursing note.  HISTORY  Chief Complaint Chest Pain and Headache   Historian Patient  HPI Keith Carpenter is a 61 y.o. male arrived by EMS, states his dog sitter called for him to have EMS transferred to the hospital.  Patient states that he has been feeling weak for over a week or so now which started initially with severe abdominal cramping which was intermittent although he denies any diarrhea, he did have decreased p.o. intake.  States that he has had mild shortness of breath/cough, without fevers.  States that he had a gradual onset headache yesterday and yesterday evening also started noticing left face left arm and left leg were having numbness and tingling.  Upon waking he felt like his left arm and left leg were weak.  He takes baby aspirin daily.  No known history of prior stroke.  He does state he has a history of prior migraines.  Review past medical history indicates history of Ethelene Hal as well as panic disorder.   Past Medical History:  Diagnosis Date  . Gastric ulcer   . H/O Guillain-Barre syndrome   . Helicobacter pylori gastritis treated 07/2017  . Hypocalcemia   . Panic disorder   . Schamberg disease   . Thoracic aortic aneurysm (HCC)    4.4 cm [facility] at [facility]  . Thyroid cancer [facility])     Patient Active Problem List   Diagnosis Date Noted  . Neuropathy 01/22/2017  . Left testicular pain 08/18/2016  . 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 Hypothyroidism 08/12/2015  . H/O disease 07/12/2015  . Calcium deficiency disease 07/12/2015  . Burning sensation of the foot 07/12/2015  . 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 Neurocardiogenic syncope 07/12/2015  . Clinical depression 05/17/2015  . Psoriatic arthritis ([place]) 03/12/2015  . Aneurysm of thoracic  aorta ([place]) 01/28/2015  . Chest pain 06/21/2014  . Thoracic aortic aneurysm ([place])   . Essential hypertension   . Aneurysm of ascending aorta (HCC) 04/18/2014  . Adynamia 04/18/2014  . Vitamin D deficiency 07/15/2009  . Colonic constipation 02/24/2008  . Episodic paroxysmal anxiety disorder 02/22/2008  . RESTLESS LEGS SYNDROME 08/31/2007  . Type 2 diabetes mellitus ([place]) 08/30/2007  . Hyperlipemia 08/30/2007  . 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 Headache 08/30/2007  . Insomnia 08/17/2007  . Disorder of male genital organ 07/08/2007  . Acid reflux 11/26/2006  . Gastric ulcer 11/26/2006  . Headache, migraine 11/26/2006  . Depression, neurotic 11/25/2006    Past Surgical History:  Procedure Laterality Date  . CARDIAC CATHETERIZATION  06/07/2014   [facility]  . CARDIAC CATHETERIZATION  2015  . MENISCUS REPAIR Right   . THYROIDECTOMY      Prior to Admission medications   Medication Sig Start Date End Date Taking? Authorizing Provider  amLODipine (NORVASC) 5 MG tablet TAKE ONE (1) TABLET EACH DAY 10/27/16   Birdie Sons, MD  ANDRODERM 4 MG/24HR PT24 patch  05/26/17   [provider]  aspirin EC 81 MG tablet Take 1 tablet (81 mg total) by mouth daily. Patient not taking: Reported on 08/06/2017 06/04/14   Wellington Hampshire, MD  atorvastatin (LIPITOR) 10 MG tablet Take 1 tablet (10 mg total) by mouth every evening. 04/05/17   Birdie Sons, MD  CUTIVATE 0.05 % LOTN APPLY TO AFFECTED AREA TWICE A DAY AS NEEDED FOR RASH Patient not taking: Reported on  08/06/2017 02/04/16   Margarita Rana, MD  DULoxetine (CYMBALTA) 30 MG capsule Take 1 capsule (30 mg total) by mouth daily. 08/18/17   Birdie Sons, MD  fentaNYL (DURAGESIC - DOSED MCG/HR) 50 MCG/HR Place 1 patch (50 mcg total) every other day onto the skin. 08/02/17   Birdie Sons, MD  HYDROcodone-acetaminophen (NORCO/VICODIN) 5-325 MG tablet TAKE ONE TABLET TWICE DAILY AS NEEDED FOR MODERATE PAIN Patient not taking:  Reported on 08/06/2017 04/30/17   Birdie Sons, MD  JANUVIA 100 MG tablet TAKE ONE TABLET BY MOUTH DAILY 05/14/17   Birdie Sons, MD  levothyroxine (SYNTHROID, LEVOTHROID) 137 MCG tablet Take 1 tablet (137 mcg total) by mouth daily. 01/29/17   Birdie Sons, MD  MELATONIN PO Take 1-2 tablets by mouth as needed.    [provider]  metoprolol tartrate (LOPRESSOR) 50 MG tablet TAKE ONE TABLET TWICE DAILY 06/02/17   Birdie Sons, MD  omeprazole (PRILOSEC) 20 MG capsule Take 1 capsule (20 mg total) by mouth daily. 08/06/17   Birdie Sons, MD  testosterone Renae Gloss) 4 MG/24HR PT24 patch Place 1 patch onto the skin daily. Patient not taking: Reported on 08/18/2017 05/24/17   Birdie Sons, MD  Vitamin D, Ergocalciferol, (DRISDOL) 50000 units CAPS capsule Take 1 capsule (50,000 Units total) by mouth every 7 (seven) days. Patient not taking: Reported on 08/06/2017 03/26/17   Birdie Sons, MD  zolpidem (AMBIEN) 10 MG tablet Take 1-2 tablets (10-20 mg total) by mouth at bedtime as needed. 08/06/17   Birdie Sons, MD    Allergies  Allergen Reactions  . Influenza Vaccines Other (See Comments)    Guillain Barre  . Declomycin [Demeclocycline]   . Demeclocycline Hcl   . Ginger   . Glipizide     Nausea   . Metformin And Related Nausea Only    Metformin IR 500 BID  . Other     Other reaction(s): Other (See Comments) Uncoded Allergy. Allergen: Other Allergy: See Patient Chart for Details, Other Reaction: Other reaction  . Remicade [Infliximab]   . Suvorexant Other (See Comments)    vision changes  . Clarithromycin Rash  . Pioglitazone Itching, Swelling and Rash    Family History  Problem Relation Age of Onset  . Hyperlipidemia Mother   . Heart attack Father 61  . Hypertension Father   . Hyperlipidemia Father   . Heart failure Father   . Prostate cancer Neg Hx   . Kidney cancer Neg Hx   . Bladder Cancer Neg Hx   . Kidney disease Neg Hx     Social  History Social History   Tobacco Use  . Smoking status: Never Smoker  . Smokeless tobacco: Never Used  Substance Use Topics  . Alcohol use: No  . Drug use: No    Review of Systems  Constitutional: Negative for fever. Eyes: Negative for visual changes. ENT: Negative for sore throat. Cardiovascular: Negative for chest pain. Respiratory: Mild congestion and mild shortness of breath. Gastrointestinal: Negative for any ongoing abdominal pain or cramping or vomiting or diarrhea. Genitourinary: Negative for dysuria. Musculoskeletal: Negative for back pain. Skin: Negative for rash. Neurological: Positive for global although more so left-sided headache.    ____________________________________________   PHYSICAL EXAM:  VITAL SIGNS: ED Triage Vitals  Enc Vitals Group     BP 08/24/17 1029 (!) 162/92     Pulse Rate 08/24/17 1029 72     Resp 08/24/17 1029 (!) 22  Temp 08/24/17 1029 98.6 F (37 C)     Temp Source 08/24/17 1029 Oral     SpO2 08/24/17 1029 100 %     Weight 08/24/17 1030 217 lb (98.4 kg)     Height --      Head Circumference --      Peak Flow --      Pain Score 08/24/17 1029 8     Pain Loc --      Pain Edu? --      Excl. in [place]? --      Constitutional: Alert and oriented.  Keeping his eyes closed, holding his body very still, but no acute distress. HEENT   Head: Normocephalic and atraumatic.      Eyes: Conjunctivae are normal. Pupils equal and round.       Ears:         Nose: No congestion/rhinnorhea.   Mouth/Throat: Mucous membranes are moist.   Neck: No stridor.  Supple, no stiff neck. Cardiovascular/Chest: Normal rate, regular rhythm.  No murmurs, rubs, or gallops. Respiratory: Normal respiratory effort without tachypnea nor retractions. Breath sounds are clear and equal bilaterally. No wheezes/rales/rhonchi. Gastrointestinal: Soft. No distention, no guarding, no rebound. Nontender.    Genitourinary/rectal:Deferred Musculoskeletal: Nontender  with normal range of motion in all extremities. No joint effusions.  No lower extremity tenderness.  No edema. Neurologic: Mild left-sided facial droop, no abnormal tongue protrusion.  Paresthesia to the left face.  Patient is able to hold his arms outstretched with palms up without dropping to the ground, but reports feeling of fatigue and pain to the left arm and shoulder which is doing the holding.  Paresthesia to the left arm and left leg.  He seems generally weak all over, but the left lower extremity he was unable to hold up at all.  No slurred speech appreciated.  Language normal. Skin:  Skin is warm, dry and intact. No rash noted. Psychiatric: Mood and affect are normal. Speech and behavior are normal. Patient exhibits appropriate insight and judgment.   ____________________________________________  LABS (pertinent positives/negatives) I, Lisa Roca, MD the attending physician have reviewed the labs noted below.  Labs Reviewed  COMPREHENSIVE METABOLIC PANEL - Abnormal; Notable for the following components:      Result Value   Chloride 99 (*)    Glucose, Bld 147 (*)    Calcium 8.8 (*)    All other components within normal limits  CBC WITH DIFFERENTIAL/PLATELET - Abnormal; Notable for the following components:   Neutro Abs 6.7 (*)    All other components within normal limits  T4, FREE - Abnormal; Notable for the following components:   Free T4 1.28 (*)    All other components within normal limits  URINALYSIS, COMPLETE (UACMP) WITH MICROSCOPIC - Abnormal; Notable for the following components:   Color, Urine YELLOW (*)    APPearance CLEAR (*)    Squamous Epithelial / LPF 0-5 (*)    All other components within normal limits  COOXEMETRY PANEL - Abnormal; Notable for the following components:   Carboxyhemoglobin 2.2 (*)    All other components within normal limits  ETHANOL  LIPASE, BLOOD  TROPONIN I  LACTIC ACID, PLASMA  PROTIME-INR  TSH  URINE DRUG SCREEN, QUALITATIVE ([facility]  ONLY)    ____________________________________________    EKG I, Lisa Roca, MD, the attending physician have personally viewed and interpreted all ECGs.  71 bpm.  Normal sinus rhythm.  Narrow QRS.  Normal axis.  Normal ST and T wave. ____________________________________________  RADIOLOGY All Xrays were viewed by me.  Imaging interpreted by Radiologist, and I, Lisa Roca, MD the attending physician have reviewed the radiologist interpretation noted below.  CT head without contrast:IMPRESSION: Normal head CT without contrast  MRI brain:   IMPRESSION: Negative partial and motion degraded exam. No infarct or other explanation for symptoms. __________________________________________  PROCEDURES  Procedure(s) performed: None  Critical Care performed: None   ____________________________________________  ED COURSE / ASSESSMENT AND PLAN  Pertinent labs & imaging results that were available during my care of the patient were reviewed by me and considered in my medical decision making (see chart for details).    Patient presented with multiple complaints, really unclear the chronology of things, but it sounds like he is just been fatigued and weak all over with some abdominal cramping over the week which is now actually resolved, followed by some mild cough/shortness of breath and then headache yesterday that became associated to left side tingling and then upon waking felt focal weakness to the left arm and leg.  He does seem weak all over and just like he does not feel well, it is a little unclear to me whether or not his weakness in the left upper and left lower extremity are somewhat volitional, but out of a high state of suspicion, will workup for the possibility of neurologic deficit with CTA and if negative MRI.  He does have a history of migraines.  CT scan of the head was negative.  Will initiate nonspecific headache/migraine type treatment for symptom relief as far  as the headache is concerned, and sent for MRI.  Patient was not a candidate for code stroke given time of onset a little unclear and at this point I am yesterday evening would have been the onset.  His laboratory workup are all essentially reassuring.  He does not have an elevated white blood cell count or new anemic, no acute renal failure or electrolyte disturbance.  He looks a little clinically dry, and was started on IV fluids.  Symptoms do not seem consistent with ascending paralysis or Ethelene Hal.  Do not have suspicion for meningitis.  1:00 reevaluation, patient is pending chest x-ray, urinalysis and urine drug screen.   Heating and air company called to let the patient know he did have a slight gas leak.  I added onto oximetry.  Urinalysis and drug screen are overall reassuring.  Chest x-ray is reassuring for patient continues to feel well.  No neurologic symptoms and headache is gone.  Patient has minimally elevated carbon monoxide at 2.2.  The gas line has been shut off.  Patient okay for discharge home.   CONSULTATIONS:   None   Patient / Family / Caregiver informed of clinical course, medical decision-making process, and agree with plan.   I discussed return precautions, follow-up instructions, and discharge instructions with patient and/or family.  Discharge Instructions : You are evaluated for headache, as well as generalized fatigue, and some focal left-sided weakness and tingling since yesterday which is resolved after treatment for migraine.  The rest of your exam and evaluation are overall reassuring in the emergency department. as we discussed, I am most suspicious of neurologic symptoms being related to complicated migraine.  Return to the emergency department immediately for any new or worsening condition including new or worsening weakness, numbness, peeing or pooping on yourself, fever, skin rash, or any other symptoms concerning to  you.    ___________________________________________   FINAL CLINICAL IMPRESSION(S) / ED DIAGNOSES  Final diagnoses:  Generalized weakness  Generalized headache  Complicated migraine  Exposure to carbon monoxide      ___________________________________________        Note: This dictation was prepared with Dragon dictation. Any transcriptional errors that result from this process are unintentional    Lisa Roca, MD 08/24/17 1504

## 2017-08-24 NOTE — Discharge Instructions (Addendum)
You are evaluated for headache, as well as generalized fatigue, and some focal left-sided weakness and tingling since yesterday which is resolved after treatment for migraine.  The rest of your exam and evaluation are overall reassuring in the emergency department. as we discussed, I am most suspicious of neurologic symptoms being related to complicated migraine.  Return to the emergency department immediately for any new or worsening condition including new or worsening weakness, numbness, peeing or pooping on yourself, fever, skin rash, or any other symptoms concerning to you.

## 2017-08-24 NOTE — ED Notes (Addendum)
Left sided chest pain beginning at approx 830AM. Intermittent headaches and left sided shoulder and arm pain and tingling over the last few days. Awoke with worsening tingling and decreased senation to left arm and left leg this AM. unknown LNW.

## 2017-08-24 NOTE — ED Triage Notes (Addendum)
Pt to ER via [facility] for home c/o SOB, left sided chest pain, then left sided headache began, followed by left sided arm and shoulder pain. Pt appears anxious. Speaks in full and complete sentences. RR even and unlabored. Black tarry stools last night per patient. EDP at bedside. VSS with EMS, 200 CBG with EMS.  Recent hx of h.pylori-reports missed endoscopy. Syncope on Sunday X 1.

## 2017-08-24 NOTE — ED Notes (Signed)
Patient transported to X-ray 

## 2017-08-24 NOTE — ED Notes (Signed)
Pt to MRI

## 2017-08-24 NOTE — ED Notes (Addendum)
Pt back from MRI 

## 2017-08-24 NOTE — ED Notes (Addendum)
Pt able to stand at bedside and urinate in urinal. Family at bedside. EDP at bedside. Pt appear much more calm at this time, RR even and unlabored. Pt having problems with heat system at home; repairman called while this RN in room and states that pt had small carbon monoxide leak and that gas will be turned off at this time. EDP informed. Pt was unaware prior to phone call.  Pt reports that he feels much better. Has full ROM of all extremities at this time. Speech clear. Facial symmetry. Equal sensation of all extremities.

## 2017-08-24 NOTE — ED Notes (Signed)
Patient transported to CT 

## 2017-08-24 NOTE — ED Notes (Addendum)
Pt alert and oriented X4, active, cooperative, pt in NAD. RR even and unlabored, color WNL.  Pt informed to return if any life threatening symptoms occur.  Discharge and followup instructions reviewed. Pt able to ambulate without difficulty prior to discharge. Left with all of belongings.

## 2017-08-25 ENCOUNTER — Emergency Department: Payer: BC Managed Care – PPO

## 2017-08-25 ENCOUNTER — Emergency Department
Admission: EM | Admit: 2017-08-25 | Discharge: 2017-08-26 | Disposition: A | Discharge status: Home / Self Care | Payer: BC Managed Care – PPO | Source: Home / Self Care | Attending: Emergency Medicine | Admitting: Emergency Medicine

## 2017-08-25 ENCOUNTER — Encounter: Payer: Self-pay | Admitting: Emergency Medicine

## 2017-08-25 ENCOUNTER — Other Ambulatory Visit: Payer: Self-pay

## 2017-08-25 DIAGNOSIS — R109 Unspecified abdominal pain: Secondary | ICD-10-CM | POA: Diagnosis not present

## 2017-08-25 DIAGNOSIS — Z79899 Other long term (current) drug therapy: Secondary | ICD-10-CM | POA: Insufficient documentation

## 2017-08-25 DIAGNOSIS — E119 Type 2 diabetes mellitus without complications: Secondary | ICD-10-CM

## 2017-08-25 DIAGNOSIS — K5732 Diverticulitis of large intestine without perforation or abscess without bleeding: Secondary | ICD-10-CM

## 2017-08-25 DIAGNOSIS — Z85850 Personal history of malignant neoplasm of thyroid: Secondary | ICD-10-CM | POA: Diagnosis not present

## 2017-08-25 DIAGNOSIS — R4182 Altered mental status, unspecified: Secondary | ICD-10-CM | POA: Diagnosis not present

## 2017-08-25 DIAGNOSIS — K5792 Diverticulitis of intestine, part unspecified, without perforation or abscess without bleeding: Secondary | ICD-10-CM | POA: Diagnosis not present

## 2017-08-25 DIAGNOSIS — R51 Headache: Secondary | ICD-10-CM | POA: Diagnosis not present

## 2017-08-25 DIAGNOSIS — Z7984 Long term (current) use of oral hypoglycemic drugs: Secondary | ICD-10-CM | POA: Insufficient documentation

## 2017-08-25 DIAGNOSIS — R55 Syncope and collapse: Secondary | ICD-10-CM | POA: Diagnosis present

## 2017-08-25 DIAGNOSIS — I10 Essential (primary) hypertension: Secondary | ICD-10-CM | POA: Insufficient documentation

## 2017-08-25 DIAGNOSIS — E039 Hypothyroidism, unspecified: Secondary | ICD-10-CM

## 2017-08-25 DIAGNOSIS — E785 Hyperlipidemia, unspecified: Secondary | ICD-10-CM | POA: Diagnosis not present

## 2017-08-25 DIAGNOSIS — R531 Weakness: Secondary | ICD-10-CM | POA: Diagnosis not present

## 2017-08-25 DIAGNOSIS — M549 Dorsalgia, unspecified: Secondary | ICD-10-CM | POA: Diagnosis not present

## 2017-08-25 DIAGNOSIS — L739 Follicular disorder, unspecified: Secondary | ICD-10-CM | POA: Diagnosis not present

## 2017-08-25 DIAGNOSIS — Z7982 Long term (current) use of aspirin: Secondary | ICD-10-CM | POA: Diagnosis not present

## 2017-08-25 DIAGNOSIS — M069 Rheumatoid arthritis, unspecified: Secondary | ICD-10-CM | POA: Diagnosis not present

## 2017-08-25 DIAGNOSIS — R259 Unspecified abnormal involuntary movements: Secondary | ICD-10-CM | POA: Diagnosis not present

## 2017-08-25 DIAGNOSIS — R1084 Generalized abdominal pain: Secondary | ICD-10-CM

## 2017-08-25 DIAGNOSIS — Z888 Allergy status to other drugs, medicaments and biological substances status: Secondary | ICD-10-CM | POA: Diagnosis not present

## 2017-08-25 DIAGNOSIS — Z79891 Long term (current) use of opiate analgesic: Secondary | ICD-10-CM | POA: Diagnosis not present

## 2017-08-25 HISTORY — DX: Systemic involvement of connective tissue, unspecified: M35.9

## 2017-08-25 LAB — URINALYSIS, COMPLETE (UACMP) WITH MICROSCOPIC
BILIRUBIN URINE: NEGATIVE
Bacteria, UA: NONE SEEN
GLUCOSE, UA: NEGATIVE mg/dL
HGB URINE DIPSTICK: NEGATIVE
Ketones, ur: NEGATIVE mg/dL
Leukocytes, UA: NEGATIVE
NITRITE: NEGATIVE
PH: 7 (ref 5.0–8.0)
Protein, ur: NEGATIVE mg/dL
SPECIFIC GRAVITY, URINE: 1.006 (ref 1.005–1.030)
Squamous Epithelial / LPF: NONE SEEN

## 2017-08-25 LAB — COMPREHENSIVE METABOLIC PANEL
ALT: 24 U/L (ref 17–63)
ANION GAP: 12 (ref 5–15)
AST: 29 U/L (ref 15–41)
Albumin: 4.1 g/dL (ref 3.5–5.0)
Alkaline Phosphatase: 74 U/L (ref 38–126)
BUN: 11 mg/dL (ref 6–20)
CALCIUM: 8.9 mg/dL (ref 8.9–10.3)
CHLORIDE: 101 mmol/L (ref 101–111)
CO2: 24 mmol/L (ref 22–32)
CREATININE: 0.7 mg/dL (ref 0.61–1.24)
Glucose, Bld: 137 mg/dL — ABNORMAL HIGH (ref 65–99)
Potassium: 4.1 mmol/L (ref 3.5–5.1)
SODIUM: 137 mmol/L (ref 135–145)
Total Bilirubin: 0.7 mg/dL (ref 0.3–1.2)
Total Protein: 8.5 g/dL — ABNORMAL HIGH (ref 6.5–8.1)

## 2017-08-25 LAB — LIPASE, BLOOD: LIPASE: 43 U/L (ref 11–51)

## 2017-08-25 LAB — CBC
HCT: 42.3 % (ref 40.0–52.0)
HEMOGLOBIN: 14.7 g/dL (ref 13.0–18.0)
MCH: 29.7 pg (ref 26.0–34.0)
MCHC: 34.8 g/dL (ref 32.0–36.0)
MCV: 85.5 fL (ref 80.0–100.0)
PLATELETS: 288 10*3/uL (ref 150–440)
RBC: 4.95 MIL/uL (ref 4.40–5.90)
RDW: 12.9 % (ref 11.5–14.5)
WBC: 11 10*3/uL — AB (ref 3.8–10.6)

## 2017-08-25 MED ORDER — PANTOPRAZOLE SODIUM 40 MG IV SOLR
40.0000 mg | Freq: Once | INTRAVENOUS | Status: AC
  Administered 2017-08-25: 40 mg via INTRAVENOUS
  Filled 2017-08-25: qty 40

## 2017-08-25 MED ORDER — CIPROFLOXACIN HCL 500 MG PO TABS
500.0000 mg | ORAL_TABLET | Freq: Once | ORAL | Status: AC
  Administered 2017-08-25: 500 mg via ORAL
  Filled 2017-08-25: qty 1

## 2017-08-25 MED ORDER — METRONIDAZOLE 500 MG PO TABS
500.0000 mg | ORAL_TABLET | Freq: Once | ORAL | Status: AC
Start: 2017-08-25 — End: 2017-08-25
  Administered 2017-08-25: 500 mg via ORAL
  Filled 2017-08-25: qty 1

## 2017-08-25 MED ORDER — FAMOTIDINE 20 MG PO TABS: 20.0000 mg | ORAL_TABLET | Freq: Two times a day (BID) | ORAL | 0 refills | Status: DC

## 2017-08-25 MED ORDER — ONDANSETRON HCL 4 MG/2ML IJ SOLN
4.0000 mg | Freq: Once | INTRAMUSCULAR | Status: AC
  Administered 2017-08-25: 4 mg via INTRAVENOUS
  Filled 2017-08-25: qty 2

## 2017-08-25 MED ORDER — IOPAMIDOL (ISOVUE-300) INJECTION 61%
30.0000 mL | Freq: Once | INTRAVENOUS | Status: AC | PRN
  Administered 2017-08-25: 30 mL via ORAL

## 2017-08-25 MED ORDER — ONDANSETRON 4 MG PO TBDP: 4.0000 mg | ORAL_TABLET | Freq: Three times a day (TID) | ORAL | 0 refills | Status: DC | PRN

## 2017-08-25 MED ORDER — IOPAMIDOL (ISOVUE-300) INJECTION 61%
100.0000 mL | Freq: Once | INTRAVENOUS | Status: AC | PRN
  Administered 2017-08-25: 100 mL via INTRAVENOUS

## 2017-08-25 MED ORDER — FENTANYL 50 MCG/HR TD PT72
50.0000 ug | MEDICATED_PATCH | TRANSDERMAL | Status: DC
  Administered 2017-08-25: 50 ug via TRANSDERMAL
  Filled 2017-08-25: qty 1

## 2017-08-25 MED ORDER — CIPROFLOXACIN HCL 500 MG PO TABS: 500.0000 mg | ORAL_TABLET | Freq: Two times a day (BID) | ORAL | 0 refills | Status: DC

## 2017-08-25 MED ORDER — HYDROMORPHONE HCL 1 MG/ML IJ SOLN
1.0000 mg | Freq: Once | INTRAMUSCULAR | Status: AC
  Administered 2017-08-25: 1 mg via INTRAVENOUS

## 2017-08-25 MED ORDER — HYDROMORPHONE HCL 1 MG/ML IJ SOLN
INTRAMUSCULAR | Status: AC
  Administered 2017-08-25: 1 mg via INTRAVENOUS
  Filled 2017-08-25: qty 1

## 2017-08-25 MED ORDER — HYDROMORPHONE HCL 1 MG/ML IJ SOLN: 1.0000 mg | INTRAMUSCULAR | Status: DC

## 2017-08-25 MED ORDER — METRONIDAZOLE 500 MG PO TABS: 500.0000 mg | ORAL_TABLET | Freq: Three times a day (TID) | ORAL | 0 refills | Status: DC

## 2017-08-25 MED ORDER — SODIUM CHLORIDE 0.9 % IV BOLUS (SEPSIS)
1000.0000 mL | Freq: Once | INTRAVENOUS | Status: AC
Start: 2017-08-25 — End: 2017-08-25
  Administered 2017-08-25: 1000 mL via INTRAVENOUS

## 2017-08-25 MED ORDER — HYDROMORPHONE HCL 1 MG/ML IJ SOLN
0.5000 mg | INTRAMUSCULAR | Status: AC
  Administered 2017-08-25: 0.5 mg via INTRAVENOUS
  Filled 2017-08-25: qty 1

## 2017-08-25 NOTE — ED Triage Notes (Signed)
Pt arrived via EMS from home. 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  EMS was called out to the house for fall x2 and agonal breathing. EMS states when they arrived patient was lying in a fetal position on the floor.  Pt has chronic pain and wears Fentanyl patches.  Patient had on 2 fentanyl patches in place one was new and one was from when he forgot to remove when placing the new one.   Pt was seen in ED last night for severe HA and leg numbness.  Today he c/o his face being numb, LLQ abdominal pain and left shoulder and left neck pain.  Pt is speaking very softly.  Sister is coming from out of town.

## 2017-08-25 NOTE — ED Notes (Addendum)
Attempted IV access x2 unsuccessful. Pt given urinal.  Blinders put up around stretcher and given blanket.

## 2017-08-25 NOTE — ED Provider Notes (Signed)
[facility] Emergency Department Provider Note  ____________________________________________  Time seen: Approximately 5:29 PM  I have reviewed the triage vital signs and the nursing notes.   HISTORY  Chief Complaint Abdominal Pain and Numbness    HPI Keith Carpenter is a 61 y.o. male who complains of left lower quadrant abdominal pain for the past 2 months. He had been on a course of antibiotics and antacids for H. pylori peptic ulcer disease, diagnosed by his primary care doctor, Dr. Caryn Section, and he finished the course of these medicines one week ago. The last 2-3 days his symptoms are worsened. Decreased appetite and decreased ability to eat or drink. Loose bowel movements that are black for the past week. No vomiting. No fever chills or sweats. No aggravating or alleviating factors, nonradiating, severe pain.     Past Medical History:  Diagnosis Date  . Collagen vascular disease (HCC)    RA  . Diabetes mellitus without complication ([place])   . Gastric ulcer   . H/O Guillain-Barre syndrome   . Helicobacter pylori gastritis treated 07/2017  . Hypertension   . Hypocalcemia   . Panic disorder   . Schamberg disease   . Thoracic aortic aneurysm (HCC)    4.4 cm followed at [facility]  . Thyroid cancer [facility])      Patient Active Problem List   Diagnosis Date Noted  . Neuropathy 01/22/2017  . Left testicular pain 08/18/2016  . 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 Hypothyroidism 08/12/2015  . H/O disease 07/12/2015  . Calcium deficiency disease 07/12/2015  . Burning sensation of the foot 07/12/2015  . 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 Neurocardiogenic syncope 07/12/2015  . Clinical depression 05/17/2015  . Psoriatic arthritis ([place]) 03/12/2015  . Aneurysm of thoracic aorta ([place]) 01/28/2015  . Chest pain 06/21/2014  . Thoracic aortic aneurysm ([place])   . Essential hypertension   . Aneurysm of ascending aorta (HCC)  04/18/2014  . Adynamia 04/18/2014  . Vitamin D deficiency 07/15/2009  . Colonic constipation 02/24/2008  . Episodic paroxysmal anxiety disorder 02/22/2008  . RESTLESS LEGS SYNDROME 08/31/2007  . Type 2 diabetes mellitus ([place]) 08/30/2007  . Hyperlipemia 08/30/2007  . 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 Headache 08/30/2007  . Insomnia 08/17/2007  . Disorder of male genital organ 07/08/2007  . Acid reflux 11/26/2006  . Gastric ulcer 11/26/2006  . Headache, migraine 11/26/2006  . Depression, neurotic 11/25/2006     Past Surgical History:  Procedure Laterality Date  . CARDIAC CATHETERIZATION  06/07/2014   [facility]  . CARDIAC CATHETERIZATION  2015  . MENISCUS REPAIR Right   . THYROIDECTOMY       Prior to Admission medications   Medication Sig Start Date End Date Taking? Authorizing Provider  amLODipine (NORVASC) 5 MG tablet TAKE ONE (1) TABLET EACH DAY 10/27/16   Birdie Sons, MD  ANDRODERM 4 MG/24HR PT24 patch  05/26/17   [provider]  aspirin EC 81 MG tablet Take 1 tablet (81 mg total) by mouth daily. Patient not taking: Reported on 08/06/2017 06/04/14   Wellington Hampshire, MD  atorvastatin (LIPITOR) 10 MG tablet Take 1 tablet (10 mg total) by mouth every evening. 04/05/17   Birdie Sons, MD  ciprofloxacin (CIPRO) 500 MG tablet Take 1 tablet (500 mg total) by mouth 2 (two) times daily. 08/25/17   Carrie Mew, MD  CUTIVATE 0.05 % LOTN APPLY TO AFFECTED AREA TWICE A DAY AS NEEDED FOR RASH Patient not taking: Reported on 08/06/2017 02/04/16  Margarita Rana, MD  DULoxetine (CYMBALTA) 30 MG capsule Take 1 capsule (30 mg total) by mouth daily. 08/18/17   Birdie Sons, MD  famotidine (PEPCID) 20 MG tablet Take 1 tablet (20 mg total) by mouth 2 (two) times daily. 08/25/17   Carrie Mew, MD  fentaNYL (DURAGESIC - DOSED MCG/HR) 50 MCG/HR Place 1 patch (50 mcg total) every other day onto the skin. 08/02/17   Birdie Sons, MD  HYDROcodone-acetaminophen  (NORCO/VICODIN) 5-325 MG tablet TAKE ONE TABLET TWICE DAILY AS NEEDED FOR MODERATE PAIN Patient not taking: Reported on 08/06/2017 04/30/17   Birdie Sons, MD  JANUVIA 100 MG tablet TAKE ONE TABLET BY MOUTH DAILY 05/14/17   Birdie Sons, MD  levothyroxine (SYNTHROID, LEVOTHROID) 137 MCG tablet Take 1 tablet (137 mcg total) by mouth daily. 01/29/17   Birdie Sons, MD  MELATONIN PO Take 1-2 tablets by mouth as needed.    [provider]  metoprolol tartrate (LOPRESSOR) 50 MG tablet TAKE ONE TABLET TWICE DAILY 06/02/17   Birdie Sons, MD  metroNIDAZOLE (FLAGYL) 500 MG tablet Take 1 tablet (500 mg total) by mouth 3 (three) times daily. 08/25/17   Carrie Mew, MD  omeprazole (PRILOSEC) 20 MG capsule Take 1 capsule (20 mg total) by mouth daily. 08/06/17   Birdie Sons, MD  ondansetron (ZOFRAN ODT) 4 MG disintegrating tablet Take 1 tablet (4 mg total) by mouth every 8 (eight) hours as needed for nausea or vomiting. 08/25/17   Carrie Mew, MD  testosterone Renae Gloss) 4 MG/24HR PT24 patch Place 1 patch onto the skin daily. Patient not taking: Reported on 08/18/2017 05/24/17   Birdie Sons, MD  Vitamin D, Ergocalciferol, (DRISDOL) 50000 units CAPS capsule Take 1 capsule (50,000 Units total) by mouth every 7 (seven) days. Patient not taking: Reported on 08/06/2017 03/26/17   Birdie Sons, MD  zolpidem (AMBIEN) 10 MG tablet Take 1-2 tablets (10-20 mg total) by mouth at bedtime as needed. 08/06/17   Birdie Sons, MD     Allergies Influenza vaccines; Declomycin [demeclocycline]; Demeclocycline hcl; Ginger; Glipizide; Metformin and related; Other; Remicade [infliximab]; Suvorexant; Clarithromycin; and Pioglitazone   Family History  Problem Relation Age of Onset  . Hyperlipidemia Mother   . Heart attack Father 85  . Hypertension Father   . Hyperlipidemia Father   . Heart failure Father   . Prostate cancer Neg Hx   . Kidney cancer Neg Hx   . Bladder Cancer  Neg Hx   . Kidney disease Neg Hx     Social History Social History   Tobacco Use  . Smoking status: Never Smoker  . Smokeless tobacco: Never Used  Substance Use Topics  . Alcohol use: No  . Drug use: No    Review of Systems  Constitutional:   No fever or chills.  ENT:   No sore throat. No rhinorrhea. Cardiovascular:   No chest pain or syncope. Respiratory:   No dyspnea or cough. Gastrointestinal:   Positive for left lower quadrant abdominal pain without vomiting and diarrhea.  Musculoskeletal:   Negative for focal pain or swelling All other systems reviewed and are negative except as documented above in ROS and HPI.  ____________________________________________   PHYSICAL EXAM:  VITAL SIGNS: ED Triage Vitals  Enc Vitals Group     BP 08/25/17 1608 (!) 157/79     Pulse Rate 08/25/17 1608 70     Resp 08/25/17 1608 16     Temp 08/25/17 1608 98.3 F (  36.8 C)     Temp Source 08/25/17 1608 Oral     SpO2 08/25/17 1608 98 %     Weight 08/25/17 1609 217 lb (98.4 kg)     Height 08/25/17 1609 5\' 11"  (1.803 m)     Head Circumference --      Peak Flow --      Pain Score --      Pain Loc --      Pain Edu? --      Excl. in [place]? --     Vital signs reviewed, nursing assessments reviewed.   Constitutional:   Alert and oriented. Not in distress. Eyes:   No scleral icterus.  EOMI. No nystagmus. No conjunctival pallor. PERRL. ENT   Head:   Normocephalic and atraumatic.   Nose:   No congestion/rhinnorhea.    Mouth/Throat:   Dry mucous membranes, no pharyngeal erythema. No peritonsillar mass.    Neck:   No meningismus. Full ROM. Hematological/Lymphatic/Immunilogical:   No cervical lymphadenopathy. Cardiovascular:   RRR. Symmetric bilateral radial and DP pulses.  No murmurs.  Respiratory:   Normal respiratory effort without tachypnea/retractions. Breath sounds are clear and equal bilaterally. No wheezes/rales/rhonchi. Gastrointestinal:   Soft with generalized  tenderness, worse in the left lower quadrant. Non distended. There is no CVA tenderness.  No rebound or rigidity, but patient is guarding the left lower quadrant. Rectal exam reveals dark stool, hemoccult positive. Controls okay. Genitourinary:   deferred Musculoskeletal:   Normal range of motion in all extremities. No joint effusions.  No lower extremity tenderness.  No edema. Neurologic:   Normal speech and language.  Motor grossly intact. Reflexes intact and symmetric in patellar and biceps tendons No gross focal neurologic deficits are appreciated.  Skin:    Skin is warm, dry and intact. No rash noted.  No petechiae, purpura, or bullae.  ____________________________________________    LABS (pertinent positives/negatives) (all labs ordered are listed, but only abnormal results are displayed) Labs Reviewed  COMPREHENSIVE METABOLIC PANEL - Abnormal; Notable for the following components:      Result Value   Glucose, Bld 137 (*)    Total Protein 8.5 (*)    All other components within normal limits  CBC - Abnormal; Notable for the following components:   WBC 11.0 (*)    All other components within normal limits  URINALYSIS, COMPLETE (UACMP) WITH MICROSCOPIC - Abnormal; Notable for the following components:   Color, Urine YELLOW (*)    APPearance CLEAR (*)    All other components within normal limits  LIPASE, BLOOD   ____________________________________________   EKG    ____________________________________________    RADIOLOGY  Ct Abdomen Pelvis W Contrast  Result Date: 08/25/2017 CLINICAL DATA:  61 year old male presents status post multiple falls. History of Guillan-Barre. Currently complains of left lower quadrant abdominal pain. EXAM: CT ABDOMEN AND PELVIS WITH CONTRAST TECHNIQUE: Multidetector CT imaging of the abdomen and pelvis was performed using the standard protocol following bolus administration of intravenous contrast. CONTRAST:  120mL ISOVUE-300 IOPAMIDOL  (ISOVUE-300) INJECTION 61% COMPARISON:  CT report from 07/23/2003 FINDINGS: Lower chest: Normal heart size without pericardial effusion. No active pulmonary disease. Hepatobiliary: No enhancing hepatic mass or biliary dilatation. Normal gallbladder without stones. Pancreas: Normal Spleen: Normal Adrenals/Urinary Tract: Normal bilateral adrenal glands. Tiny too small to further characterize hypodensity in the posterior interpolar cortex of the left kidney measuring 4 mm. Similar 3 mm hypodensity in the upper pole the right kidney. Statistically these are more likely to represent cysts.  No obstructive uropathy. Unremarkable urinary bladder. Stomach/Bowel: Acute distal descending diverticulitis without bowel obstruction or abscess. The stomach, small intestine, and appendix are normal. Vascular/Lymphatic: No significant vascular findings are present. No enlarged abdominal or pelvic lymph nodes. Reproductive: Enlarged prostate with more focal hyperdense nodule impressing upon the base of the bladder measuring 11 x 9 x 14 mm. Other: Bilateral fat containing inguinal hernias. 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 No acute nor suspicious osseous lesions. Bilateral hip and sacroiliac joint osteoarthritis. Lower lumbar degenerative facet arthropathy. IMPRESSION: 1. Acute uncomplicated distal descending diverticulitis with pericolonic inflammation. No bowel obstruction, abscess nor free air. 2. Tiny too small to characterize bilateral renal hypodensities statistically consistent tiny cysts. 3. Enlarged prostate with more focal hyperdense nodule impressing upon the base of the bladder measuring 11 x 9 x 14 mm. Electronically Signed   By: Ashley Royalty M.D.   On: 08/25/2017 19:37    ____________________________________________   PROCEDURES Procedures  ____________________________________________   DIFFERENTIAL  DIAGNOSIS  Diverticulitis, bowel perforation, bowel obstruction, peptic ulcer disease  CLINICAL IMPRESSION / ASSESSMENT AND PLAN / ED COURSE  Pertinent labs & imaging results that were available during my care of the patient were reviewed by me and considered in my medical decision making (see chart for details).   Patient presents with generalized abdominal pain and occult GI bleed. Concerning exam. We'll obtain a CT scan to further evaluate for possible surgical abdomen.  Clinical Course as of Aug 26 2319  Wed Aug 25, 2017  1728 The patient was offered pain medication during my initial assessment.  They decline at that time.  I asked them to let us know if they change their mind or if the pain worsens so we can re-address their analgesia needs.    [PS]  1657 Pt started reporting more severe "electric pain" all over his body. Undressed, thorough skin exam, confirmed no remaining medication patches on body, gave 1mg  dilaudid iv.  [PS]  2236 Persistent pain. I think combination of worsened chronic pain due to removal of fentanyl patches, as well as acute sx of diverticulitis. Will replace fentanyl patch, give iv dilaudid, zofran, ppi. Reasses po tolerance and sx control.   [PS]    Clinical Course User Index [PS] Carrie Mew, MD     ____________________________________________   FINAL CLINICAL IMPRESSION(S) / ED DIAGNOSES    Final diagnoses:  Generalized abdominal pain  Sigmoid diverticulitis      This SmartLink is deprecated. Use AVSMEDLIST instead to display the medication list for a patient.   Portions of this note were generated with dragon dictation software. Dictation errors may occur despite best attempts at proofreading.    Carrie Mew, MD 08/25/17 [phone]

## 2017-08-26 ENCOUNTER — Observation Stay
Admission: EM | Admit: 2017-08-26 | Discharge: 2017-08-28 | Disposition: A | Discharge status: Home / Self Care | Payer: BC Managed Care – PPO | Attending: Specialist | Admitting: Specialist

## 2017-08-26 ENCOUNTER — Observation Stay: Payer: BC Managed Care – PPO

## 2017-08-26 ENCOUNTER — Other Ambulatory Visit: Payer: Self-pay

## 2017-08-26 DIAGNOSIS — E039 Hypothyroidism, unspecified: Secondary | ICD-10-CM | POA: Insufficient documentation

## 2017-08-26 DIAGNOSIS — R259 Unspecified abnormal involuntary movements: Secondary | ICD-10-CM | POA: Insufficient documentation

## 2017-08-26 DIAGNOSIS — I10 Essential (primary) hypertension: Secondary | ICD-10-CM | POA: Insufficient documentation

## 2017-08-26 DIAGNOSIS — J189 Pneumonia, unspecified organism: Secondary | ICD-10-CM

## 2017-08-26 DIAGNOSIS — E119 Type 2 diabetes mellitus without complications: Secondary | ICD-10-CM | POA: Insufficient documentation

## 2017-08-26 DIAGNOSIS — Z888 Allergy status to other drugs, medicaments and biological substances status: Secondary | ICD-10-CM | POA: Insufficient documentation

## 2017-08-26 DIAGNOSIS — E785 Hyperlipidemia, unspecified: Secondary | ICD-10-CM | POA: Insufficient documentation

## 2017-08-26 DIAGNOSIS — R109 Unspecified abdominal pain: Secondary | ICD-10-CM | POA: Insufficient documentation

## 2017-08-26 DIAGNOSIS — R251 Tremor, unspecified: Secondary | ICD-10-CM | POA: Diagnosis not present

## 2017-08-26 DIAGNOSIS — R4182 Altered mental status, unspecified: Secondary | ICD-10-CM | POA: Insufficient documentation

## 2017-08-26 DIAGNOSIS — R531 Weakness: Principal | ICD-10-CM

## 2017-08-26 DIAGNOSIS — Z7982 Long term (current) use of aspirin: Secondary | ICD-10-CM | POA: Insufficient documentation

## 2017-08-26 DIAGNOSIS — M549 Dorsalgia, unspecified: Secondary | ICD-10-CM | POA: Insufficient documentation

## 2017-08-26 DIAGNOSIS — Z85850 Personal history of malignant neoplasm of thyroid: Secondary | ICD-10-CM | POA: Insufficient documentation

## 2017-08-26 DIAGNOSIS — L739 Follicular disorder, unspecified: Secondary | ICD-10-CM | POA: Insufficient documentation

## 2017-08-26 DIAGNOSIS — Z79891 Long term (current) use of opiate analgesic: Secondary | ICD-10-CM | POA: Insufficient documentation

## 2017-08-26 DIAGNOSIS — Z79899 Other long term (current) drug therapy: Secondary | ICD-10-CM | POA: Insufficient documentation

## 2017-08-26 DIAGNOSIS — R55 Syncope and collapse: Secondary | ICD-10-CM | POA: Diagnosis not present

## 2017-08-26 DIAGNOSIS — R51 Headache: Secondary | ICD-10-CM | POA: Insufficient documentation

## 2017-08-26 DIAGNOSIS — M069 Rheumatoid arthritis, unspecified: Secondary | ICD-10-CM | POA: Insufficient documentation

## 2017-08-26 DIAGNOSIS — K5792 Diverticulitis of intestine, part unspecified, without perforation or abscess without bleeding: Secondary | ICD-10-CM | POA: Insufficient documentation

## 2017-08-26 LAB — GLUCOSE, CAPILLARY: Glucose-Capillary: 132 mg/dL — ABNORMAL HIGH (ref 65–99)

## 2017-08-26 MED ORDER — ZOLPIDEM TARTRATE 5 MG PO TABS
10.0000 mg | ORAL_TABLET | Freq: Every evening | ORAL | Status: DC | PRN
  Administered 2017-08-26: 10 mg via ORAL
  Administered 2017-08-27: 20 mg via ORAL
  Filled 2017-08-26: qty 2
  Filled 2017-08-26: qty 4

## 2017-08-26 MED ORDER — AMLODIPINE BESYLATE 5 MG PO TABS
5.0000 mg | ORAL_TABLET | Freq: Every day | ORAL | Status: DC
  Administered 2017-08-26 – 2017-08-28 (×3): 5 mg via ORAL
  Filled 2017-08-26 (×3): qty 1

## 2017-08-26 MED ORDER — ACETAMINOPHEN 650 MG RE SUPP: 650.0000 mg | Freq: Four times a day (QID) | RECTAL | Status: DC | PRN

## 2017-08-26 MED ORDER — AMMONIA AROMATIC IN INHA
RESPIRATORY_TRACT | Status: AC
  Filled 2017-08-26: qty 10

## 2017-08-26 MED ORDER — PANTOPRAZOLE SODIUM 40 MG PO TBEC
40.0000 mg | DELAYED_RELEASE_TABLET | Freq: Every day | ORAL | Status: DC
  Administered 2017-08-27 – 2017-08-28 (×2): 40 mg via ORAL
  Filled 2017-08-26 (×2): qty 1

## 2017-08-26 MED ORDER — ACETAMINOPHEN 325 MG PO TABS
650.0000 mg | ORAL_TABLET | Freq: Four times a day (QID) | ORAL | Status: DC | PRN
  Administered 2017-08-27: 650 mg via ORAL
  Filled 2017-08-26: qty 2

## 2017-08-26 MED ORDER — SODIUM CHLORIDE 0.9 % IV BOLUS (SEPSIS)
1000.0000 mL | Freq: Once | INTRAVENOUS | Status: AC
  Administered 2017-08-26: 1000 mL via INTRAVENOUS

## 2017-08-26 MED ORDER — CIPROFLOXACIN HCL 500 MG PO TABS
500.0000 mg | ORAL_TABLET | Freq: Two times a day (BID) | ORAL | Status: DC
  Administered 2017-08-26 – 2017-08-28 (×5): 500 mg via ORAL
  Filled 2017-08-26 (×6): qty 1

## 2017-08-26 MED ORDER — LINAGLIPTIN 5 MG PO TABS
5.0000 mg | ORAL_TABLET | Freq: Every day | ORAL | Status: DC
  Filled 2017-08-26: qty 1

## 2017-08-26 MED ORDER — ONDANSETRON HCL 4 MG/2ML IJ SOLN
4.0000 mg | Freq: Four times a day (QID) | INTRAMUSCULAR | Status: DC | PRN
  Administered 2017-08-27: 4 mg via INTRAVENOUS

## 2017-08-26 MED ORDER — ONDANSETRON HCL 4 MG PO TABS
4.0000 mg | ORAL_TABLET | Freq: Four times a day (QID) | ORAL | Status: DC | PRN
  Administered 2017-08-27: 4 mg via ORAL
  Filled 2017-08-26: qty 1

## 2017-08-26 MED ORDER — DULOXETINE HCL 30 MG PO CPEP
30.0000 mg | ORAL_CAPSULE | Freq: Every day | ORAL | Status: DC
  Administered 2017-08-26 – 2017-08-28 (×3): 30 mg via ORAL
  Filled 2017-08-26 (×3): qty 1

## 2017-08-26 MED ORDER — ATORVASTATIN CALCIUM 10 MG PO TABS
10.0000 mg | ORAL_TABLET | Freq: Every evening | ORAL | Status: DC
  Filled 2017-08-26 (×2): qty 1

## 2017-08-26 MED ORDER — FENTANYL 25 MCG/HR TD PT72
50.0000 ug | MEDICATED_PATCH | TRANSDERMAL | Status: DC
  Administered 2017-08-27: 50 ug via TRANSDERMAL
  Filled 2017-08-26 (×2): qty 2

## 2017-08-26 MED ORDER — METOPROLOL TARTRATE 50 MG PO TABS
50.0000 mg | ORAL_TABLET | Freq: Two times a day (BID) | ORAL | Status: DC
  Administered 2017-08-26 – 2017-08-28 (×5): 50 mg via ORAL
  Filled 2017-08-26 (×5): qty 1

## 2017-08-26 MED ORDER — KETOROLAC TROMETHAMINE 30 MG/ML IJ SOLN
30.0000 mg | Freq: Once | INTRAMUSCULAR | Status: AC
  Administered 2017-08-26: 30 mg via INTRAVENOUS
  Filled 2017-08-26: qty 1

## 2017-08-26 MED ORDER — METRONIDAZOLE 500 MG PO TABS
500.0000 mg | ORAL_TABLET | Freq: Three times a day (TID) | ORAL | Status: DC
  Administered 2017-08-26 – 2017-08-28 (×6): 500 mg via ORAL
  Filled 2017-08-26 (×6): qty 1

## 2017-08-26 MED ORDER — LEVOTHYROXINE SODIUM 137 MCG PO TABS
137.0000 ug | ORAL_TABLET | Freq: Every day | ORAL | Status: DC
  Administered 2017-08-26 – 2017-08-28 (×3): 137 ug via ORAL
  Filled 2017-08-26 (×3): qty 1

## 2017-08-26 MED ORDER — SODIUM CHLORIDE 0.9 % IV SOLN
INTRAVENOUS | Status: DC
  Administered 2017-08-26 (×2): via INTRAVENOUS

## 2017-08-26 MED ORDER — ENOXAPARIN SODIUM 40 MG/0.4ML ~~LOC~~ SOLN
40.0000 mg | SUBCUTANEOUS | Status: DC
  Administered 2017-08-26 – 2017-08-27 (×2): 40 mg via SUBCUTANEOUS
  Filled 2017-08-26 (×2): qty 0.4

## 2017-08-26 MED ORDER — LORAZEPAM 2 MG/ML IJ SOLN
0.5000 mg | Freq: Four times a day (QID) | INTRAMUSCULAR | Status: DC | PRN
  Administered 2017-08-26 (×3): 0.5 mg via INTRAVENOUS
  Filled 2017-08-26 (×3): qty 1

## 2017-08-26 MED ORDER — FAMOTIDINE 20 MG PO TABS
20.0000 mg | ORAL_TABLET | Freq: Two times a day (BID) | ORAL | Status: DC
  Administered 2017-08-26 – 2017-08-28 (×4): 20 mg via ORAL
  Filled 2017-08-26 (×4): qty 1

## 2017-08-26 MED ORDER — DOCUSATE SODIUM 100 MG PO CAPS
100.0000 mg | ORAL_CAPSULE | Freq: Two times a day (BID) | ORAL | Status: DC
  Administered 2017-08-26 – 2017-08-27 (×2): 100 mg via ORAL
  Filled 2017-08-26 (×4): qty 1

## 2017-08-26 NOTE — H&P (Addendum)
Keith Carpenter is an 61 y.o. male.   Chief Complaint: Loss of consciousness HPI: The patient with past medical history of collagen vascular disease, Guillain-Barr, thoracic aortic aneurysm, hypertension and diabetes presents to the emergency department after collapsing in the parking lot following evaluation in the emergency department for diverticulitis.  The patient was discharged from the emergency department in stable condition and made it through the parking lot to be seated in the passenger seat his vehicle when he fell forward hitting his head on the dashboard.  His sister reports that the patient was briefly unconscious.  Evaluation in the emergency department was unremarkable.  Notably his first emergency department encounter today the patient had on 2 fentanyl patches which were removed.  He had some withdrawal symptoms immediately thereafter which prompted the emergency department staff to give him Dilaudid.  Once the patient was more alert stabilized emergency department staff called the hospitalist service for admission and for further evaluation.  Past Medical History:  Diagnosis Date  . Collagen vascular disease (HCC)    RA  . Diabetes mellitus without complication ([place])   . Gastric ulcer   . H/O Guillain-Barre syndrome   . Helicobacter pylori gastritis treated 07/2017  . Hypertension   . Hypocalcemia   . Panic disorder   . Schamberg disease   . Thoracic aortic aneurysm (HCC)    4.4 cm followed at [facility]  . Thyroid cancer [facility])     Past Surgical History:  Procedure Laterality Date  . CARDIAC CATHETERIZATION  06/07/2014   [facility]  . CARDIAC CATHETERIZATION  2015  . MENISCUS REPAIR Right   . THYROIDECTOMY      Family History  Problem Relation Age of Onset  . Hyperlipidemia Mother   . Heart attack Father 53  . Hypertension Father   . Hyperlipidemia Father   . Heart failure Father   . Prostate cancer Neg Hx   . Kidney cancer Neg Hx   . Bladder Cancer Neg Hx   .  Kidney disease Neg Hx    Social History:  reports that  has never smoked. he has never used smokeless tobacco. He reports that he does not drink alcohol or use drugs.  Allergies:  Allergies  Allergen Reactions  . Influenza Vaccines Other (See Comments)    Guillain Barre  . Declomycin [Demeclocycline]   . Demeclocycline Hcl   . Ginger   . Glipizide     Nausea   . Metformin And Related Nausea Only    Metformin IR 500 BID  . Other     Other reaction(s): Other (See Comments) Uncoded Allergy. Allergen: Other Allergy: See Patient Chart for Details, Other Reaction: Other reaction  . Remicade [Infliximab]   . Suvorexant Other (See Comments)    vision changes  . Clarithromycin Rash  . Pioglitazone Itching, Swelling and Rash    Medications Prior to Admission  Medication Sig Dispense Refill  . amLODipine (NORVASC) 5 MG tablet TAKE ONE (1) TABLET EACH DAY 30 tablet 6  . ANDRODERM 4 MG/24HR PT24 patch     . aspirin EC 81 MG tablet Take 1 tablet (81 mg total) by mouth daily. (Patient not taking: Reported on 08/06/2017) 90 tablet 3  . atorvastatin (LIPITOR) 10 MG tablet Take 1 tablet (10 mg total) by mouth every evening. 90 tablet 3  . ciprofloxacin (CIPRO) 500 MG tablet Take 1 tablet (500 mg total) by mouth 2 (two) times daily. 14 tablet 0  . CUTIVATE 0.05 % LOTN APPLY TO AFFECTED  AREA TWICE A DAY AS NEEDED FOR RASH (Patient not taking: Reported on 08/06/2017) 120 mL 5  . DULoxetine (CYMBALTA) 30 MG capsule Take 1 capsule (30 mg total) by mouth daily. 30 capsule 1  . famotidine (PEPCID) 20 MG tablet Take 1 tablet (20 mg total) by mouth 2 (two) times daily. 60 tablet 0  . fentaNYL (DURAGESIC - DOSED MCG/HR) 50 MCG/HR Place 1 patch (50 mcg total) every other day onto the skin. 15 patch 0  . HYDROcodone-acetaminophen (NORCO/VICODIN) 5-325 MG tablet TAKE ONE TABLET TWICE DAILY AS NEEDED FOR MODERATE PAIN (Patient not taking: Reported on 08/06/2017) 60 tablet 0  . JANUVIA 100 MG tablet TAKE ONE  TABLET BY MOUTH DAILY 30 tablet 12  . levothyroxine (SYNTHROID, LEVOTHROID) 137 MCG tablet Take 1 tablet (137 mcg total) by mouth daily. 90 tablet 3  . MELATONIN PO Take 1-2 tablets by mouth as needed.    . metoprolol tartrate (LOPRESSOR) 50 MG tablet TAKE ONE TABLET TWICE DAILY 60 tablet 5  . metroNIDAZOLE (FLAGYL) 500 MG tablet Take 1 tablet (500 mg total) by mouth 3 (three) times daily. 30 tablet 0  . omeprazole (PRILOSEC) 20 MG capsule Take 1 capsule (20 mg total) by mouth daily. 30 capsule 0  . ondansetron (ZOFRAN ODT) 4 MG disintegrating tablet Take 1 tablet (4 mg total) by mouth every 8 (eight) hours as needed for nausea or vomiting. 20 tablet 0  . testosterone (ANDRODERM) 4 MG/24HR PT24 patch Place 1 patch onto the skin daily. (Patient not taking: Reported on 08/18/2017) 30 patch 3  . Vitamin D, Ergocalciferol, (DRISDOL) 50000 units CAPS capsule Take 1 capsule (50,000 Units total) by mouth every 7 (seven) days. (Patient not taking: Reported on 08/06/2017) 12 capsule 4  . zolpidem (AMBIEN) 10 MG tablet Take 1-2 tablets (10-20 mg total) by mouth at bedtime as needed. 60 tablet 5    Results for orders placed or performed during the hospital encounter of 08/25/17 (from the past 48 hour(s))  Urinalysis, Complete w Microscopic     Status: Abnormal   Collection Time: 08/25/17  4:42 PM  Result Value Ref Range   Color, Urine YELLOW (A) YELLOW   APPearance CLEAR (A) CLEAR   Specific Gravity, Urine 1.006 1.005 - 1.030   pH 7.0 5.0 - 8.0   Glucose, UA NEGATIVE NEGATIVE mg/dL   Hgb urine dipstick NEGATIVE NEGATIVE   Bilirubin Urine NEGATIVE NEGATIVE   Ketones, ur NEGATIVE NEGATIVE mg/dL   Protein, ur NEGATIVE NEGATIVE mg/dL   Nitrite NEGATIVE NEGATIVE   Leukocytes, UA NEGATIVE NEGATIVE   RBC / HPF 0-5 0 - 5 RBC/hpf   WBC, UA 0-5 0 - 5 WBC/hpf   Bacteria, UA NONE SEEN NONE SEEN   Squamous Epithelial / LPF NONE SEEN NONE SEEN  Lipase, blood     Status: None   Collection Time: 08/25/17  4:50  PM  Result Value Ref Range   Lipase 43 11 - 51 U/L  Comprehensive metabolic panel     Status: Abnormal   Collection Time: 08/25/17  4:50 PM  Result Value Ref Range   Sodium 137 135 - 145 mmol/L   Potassium 4.1 3.5 - 5.1 mmol/L   Chloride 101 101 - 111 mmol/L   CO2 24 22 - 32 mmol/L   Glucose, Bld 137 (H) 65 - 99 mg/dL   BUN 11 6 - 20 mg/dL   Creatinine, Ser 0.70 0.61 - 1.24 mg/dL   Calcium 8.9 8.9 - 10.3 mg/dL   Total  Protein 8.5 (H) 6.5 - 8.1 g/dL   Albumin 4.1 3.5 - 5.0 g/dL   AST 29 15 - 41 U/L   ALT 24 17 - 63 U/L   Alkaline Phosphatase 74 38 - 126 U/L   Total Bilirubin 0.7 0.3 - 1.2 mg/dL   GFR calc non Af Amer >60 >60 mL/min   GFR calc Af Amer >60 >60 mL/min    Comment: (NOTE) The eGFR has been calculated using the CKD EPI equation. This calculation has not been validated in all clinical situations. eGFR's persistently <60 mL/min signify possible Chronic Kidney Disease.    Anion gap 12 5 - 15  CBC     Status: Abnormal   Collection Time: 08/25/17  4:50 PM  Result Value Ref Range   WBC 11.0 (H) 3.8 - 10.6 K/uL   RBC 4.95 4.40 - 5.90 MIL/uL   Hemoglobin 14.7 13.0 - 18.0 g/dL   HCT 42.3 40.0 - 52.0 %   MCV 85.5 80.0 - 100.0 fL   MCH 29.7 26.0 - 34.0 pg   MCHC 34.8 32.0 - 36.0 g/dL   RDW 12.9 11.5 - 14.5 %   Platelets 288 150 - 440 K/uL   Dg Chest 2 View  Result Date: 08/24/2017 CLINICAL DATA:  61 year old male with cough and fatigue. Left chest pain. Left headache. Left shoulder and arm pain. Blacked are a stool since last night. EXAM: CHEST  2 VIEW COMPARISON:  05/05/2016 and earlier. FINDINGS: Seated AP and lateral views of the chest. Lung volumes are stable and low-normal. Mediastinal contours remain normal. Visualized tracheal air column is within normal limits. No pneumothorax or pleural effusion. Lung parenchyma appears stable and clear. Negative visible bowel gas pattern. No pneumoperitoneum. No acute osseous abnormality identified. IMPRESSION: Negative.  No  acute cardiopulmonary abnormality. Electronically Signed   By: Genevie Ann M.D.   On: 08/24/2017 13:50   Ct Head Wo Contrast  Result Date: 08/24/2017 CLINICAL DATA:  Acute left headache, altered mental status, possible history of seizures EXAM: CT HEAD WITHOUT CONTRAST TECHNIQUE: Contiguous axial images were obtained from the base of the skull through the vertex without intravenous contrast. COMPARISON:  02/12/2008 FINDINGS: Brain: No evidence of acute infarction, hemorrhage, hydrocephalus, extra-axial collection or mass lesion/mass effect. Vascular: No hyperdense vessel or unexpected calcification. Skull: Normal. Negative for fracture or focal lesion. Sinuses/Orbits: No acute finding. Other: None. IMPRESSION: Normal head CT without contrast Electronically Signed   By: Jerilynn Mages.  Shick M.D.   On: 08/24/2017 11:06   Mr Brain Wo Contrast (neuro Protocol)  Result Date: 08/24/2017 CLINICAL DATA:  Left-sided chest pain that began this morning. Left-sided shoulder and arm pain and tingling for the last few days. EXAM: MRI HEAD WITHOUT CONTRAST TECHNIQUE: Multiplanar, multiecho pulse sequences of the brain and surrounding structures were obtained without intravenous contrast. COMPARISON:  Head CT from earlier today.  Brain MRI 09/22/2005 FINDINGS: Brain: No acute infarction, hemorrhage, hydrocephalus, extra-axial collection or mass lesion. Asymmetric lateral ventricular volume, larger at the right lateral body, chronic and developmental appearing. Vascular: Major flow voids are preserved Skull and upper cervical spine: Negative for marrow lesion. C2-3 disc degeneration. Sinuses/Orbits: No acute finding. Mild mucosal thickening in the ethmoids. Other: Patient terminated the exam before axial T1 and coronal T2 weighted imaging could be obtained. Of the acquired sequences, the latter are motion degraded. IMPRESSION: Negative partial and motion degraded exam. No infarct or other explanation for symptoms. Electronically Signed    By: Monte Fantasia M.D.   On:  08/24/2017 12:43   Ct Abdomen Pelvis W Contrast  Result Date: 08/25/2017 CLINICAL DATA:  61 year old male presents status post multiple falls. History of Guillan-Barre. Currently complains of left lower quadrant abdominal pain. EXAM: CT ABDOMEN AND PELVIS WITH CONTRAST TECHNIQUE: Multidetector CT imaging of the abdomen and pelvis was performed using the standard protocol following bolus administration of intravenous contrast. CONTRAST:  136m ISOVUE-300 IOPAMIDOL (ISOVUE-300) INJECTION 61% COMPARISON:  CT report from 07/23/2003 FINDINGS: Lower chest: Normal heart size without pericardial effusion. No active pulmonary disease. Hepatobiliary: No enhancing hepatic mass or biliary dilatation. Normal gallbladder without stones. Pancreas: Normal Spleen: Normal Adrenals/Urinary Tract: Normal bilateral adrenal glands. Tiny too small to further characterize hypodensity in the posterior interpolar cortex of the left kidney measuring 4 mm. Similar 3 mm hypodensity in the upper pole the right kidney. Statistically these are more likely to represent cysts. No obstructive uropathy. Unremarkable urinary bladder. Stomach/Bowel: Acute distal descending diverticulitis without bowel obstruction or abscess. The stomach, small intestine, and appendix are normal. Vascular/Lymphatic: No significant vascular findings are present. No enlarged abdominal or pelvic lymph nodes. Reproductive: Enlarged prostate with more focal hyperdense nodule impressing upon the base of the bladder measuring 11 x 9 x 14 mm. Other: Bilateral fat containing inguinal hernias. 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 No acute nor suspicious osseous lesions. Bilateral hip and sacroiliac joint osteoarthritis. Lower lumbar degenerative facet arthropathy. IMPRESSION: 1. Acute uncomplicated distal descending diverticulitis with pericolonic inflammation.  No bowel obstruction, abscess nor free air. 2. Tiny too small to characterize bilateral renal hypodensities statistically consistent tiny cysts. 3. Enlarged prostate with more focal hyperdense nodule impressing upon the base of the bladder measuring 11 x 9 x 14 mm. Electronically Signed   By: DAshley RoyaltyM.D.   On: 08/25/2017 19:37    Review of Systems  Constitutional: Negative for chills and fever.  HENT: Negative for sore throat and tinnitus.   Eyes: Negative for blurred vision and redness.  Respiratory: Negative for cough and shortness of breath.   Cardiovascular: Negative for chest pain, palpitations, orthopnea and PND.  Gastrointestinal: Positive for abdominal pain. Negative for diarrhea, nausea and vomiting.  Genitourinary: Negative for dysuria, frequency and urgency.  Musculoskeletal: Positive for joint pain. Negative for myalgias.  Skin: Negative for rash.       No lesions  Neurological: Positive for loss of consciousness and weakness. Negative for speech change and focal weakness.  Endo/Heme/Allergies: Does not bruise/bleed easily.       No temperature intolerance  Psychiatric/Behavioral: Negative for depression and suicidal ideas.    Blood pressure (!) 178/84, pulse 73, temperature 98.6 F (37 C), temperature source Oral, resp. rate 18, weight 98 kg (216 lb), SpO2 99 %. Physical Exam  Nursing note and vitals reviewed. Constitutional: He is oriented to person, place, and time. He appears well-developed and well-nourished. No distress.  HENT:  Head: Normocephalic and atraumatic.  Mouth/Throat: Oropharynx is clear and moist.  Eyes: Conjunctivae and EOM are normal. Pupils are equal, round, and reactive to light. No scleral icterus.  Neck: Normal range of motion. Neck supple. No JVD present. No tracheal deviation present. No thyromegaly present.  Cardiovascular: Normal rate, regular rhythm and normal heart sounds. Exam reveals no gallop and no friction rub.  No murmur  heard. Respiratory: Effort normal and breath sounds normal. No respiratory distress.  GI: Soft. Bowel sounds are normal. He exhibits no distension. There is no tenderness.  Genitourinary:  Genitourinary Comments: Deferred  Musculoskeletal: Normal range of motion. He exhibits no edema.  Lymphadenopathy:    He has no cervical adenopathy.  Neurological: He is alert and oriented to person, place, and time. No cranial nerve deficit.  Skin: Skin is warm and dry. No rash noted. No erythema.  Psychiatric: He has a normal mood and affect. His behavior is normal. Judgment and thought content normal.     Assessment/Plan This is a 61 year old male admitted for weakness. 1.  Weakness: The patient may have syncopized due to orthostasis and/or narcosis.  He did not injure himself as he was seated in the car during the episode.  We will hydrate with intravenous fluid.  Monitor narcotic medications. 2.  Diverticulitis: Stable; continue Cipro and Flagyl 3.  Essential hypertension: Uncontrolled.  At this time I would allow some hypertension as do not want to precipitate another syncopal episode.  Restart amlodipine when the patient is definitively normovolemic. 4.  Hypothyroidism: Check TSH, continue Synthroid 5.  Diabetes mellitus type 2: Continue Tradjenta (the patient does not take this in combination with metformin which would be ideal) however A1c was controlled as of last month.  Monitor blood sugars. 6.  Rheumatoid arthritis: Reportedly psoriatic; manage pain with as little narcotic medications possible. 7.  Hyperlipidemia: Continue statin therapy 8.  DVT prophylaxis: Lovenox 9.  GI prophylaxis: Pantoprazole per home regimen The patient is a full code.  Time spent on admission orders and patient care approximately 45 minutes  Harrie Foreman, MD 08/26/2017, 7:36 AM

## 2017-08-26 NOTE — Progress Notes (Signed)
Patient tried to void but was unable to do so.  He is concerned he can't pee.  I bladder scanned him and it showed there was only 145mL in his bladder.  Will continue to monitor.

## 2017-08-26 NOTE — Consult Note (Signed)
Reason for Consult:Shaking spells Referring Physician: Anselm Jungling  CC: Shaking spells, syncope  HPI: Keith Carpenter is an 61 y.o. 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  Patient uses a Fentanyl patch for arthritis pain.  Reports that on Saturday was on his deck watching his dogs and reports that he started to feel hot and sweaty.  Reports that he remembers nothing after that but awakened face forward on the deck with the dogs licking his face.  Feels that he passed out.  The patient had no bruises or facial injuries as a consequence of this syncopal event.  Patient then had another syncopal event on the night prior to admission.  Does not recall any symptoms prior to this event.  His was brought in for evaluation.  There was some question as to his appropriate use of his fentanyl patches.  Patient was sent home.  On the way home the patient again reports that he began to be hot sweaty and lightheaded while in the car.  His sister noted him to slump forward after which period of time the patient was unresponsive.  She brought him back for evaluation and the patient was admitted at that time.  Since being in the hospital the patient reports that today he had a severe burning sensation in his lower back.  After that he began to shake both arms and legs up.  This was witnessed by nursing.  The patient maintained consciousness and alertness during this episode.  He was able to talk to the nursing staff.  Patient was given 0.5 mg of Ativan and his symptoms resolved.  Patient reports no previous history of syncope or shaking spells prior to this weekend. No bowel or bladder incontinence.    Past Medical History:  Diagnosis Date  . Collagen vascular disease (HCC)    RA  . Diabetes mellitus without complication ([place])   . Gastric ulcer   . H/O Guillain-Barre syndrome   . Helicobacter pylori gastritis treated 07/2017  . Hypertension   . Hypocalcemia   . Panic disorder   . Schamberg  disease   . Thoracic aortic aneurysm (HCC)    4.4 cm [facility] at [facility]  . Thyroid cancer [facility])     Past Surgical History:  Procedure Laterality Date  . CARDIAC CATHETERIZATION  06/07/2014   [facility]  . CARDIAC CATHETERIZATION  2015  . MENISCUS REPAIR Right   . THYROIDECTOMY      Family History  Problem Relation Age of Onset  . Hyperlipidemia Mother   . Heart attack Father 22  . Hypertension Father   . Hyperlipidemia Father   . Heart failure Father   . Prostate cancer Neg Hx   . Kidney cancer Neg Hx   . Bladder Cancer Neg Hx   . Kidney disease Neg Hx     Social History:  reports that  has never smoked. he has never used smokeless tobacco. He reports that he does not drink alcohol or use drugs.  Allergies  Allergen Reactions  . Influenza Vaccines Other (See Comments)    Guillain Barre  . Declomycin [Demeclocycline]   . Demeclocycline Hcl   . Ginger   . Glipizide     Nausea   . Metformin And Related Nausea Only    Metformin IR 500 BID  . Other     Other reaction(s): Other (See Comments) Uncoded Allergy. Allergen: Other Allergy: See Patient Chart for Details, Other Reaction: Other reaction  . Remicade [Infliximab]   .  Suvorexant Other (See Comments)    vision changes  . Clarithromycin Rash  . Pioglitazone Itching, Swelling and Rash    Medications:  I have reviewed the patient's current medications. Prior to Admission:  Medications Prior to Admission  Medication Sig Dispense Refill Last Dose  . amLODipine (NORVASC) 5 MG tablet TAKE ONE (1) TABLET EACH DAY 30 tablet 6 Taking  . ANDRODERM 4 MG/24HR PT24 patch    Taking  . aspirin EC 81 MG tablet Take 1 tablet (81 mg total) by mouth daily. (Patient not taking: Reported on 08/06/2017) 90 tablet 3 Not Taking  . atorvastatin (LIPITOR) 10 MG tablet Take 1 tablet (10 mg total) by mouth every evening. 90 tablet 3 Taking  . ciprofloxacin (CIPRO) 500 MG tablet Take 1 tablet (500 mg total) by mouth 2 (two) times daily. 14 tablet  0   . CUTIVATE 0.05 % LOTN APPLY TO AFFECTED AREA TWICE A DAY AS NEEDED FOR RASH (Patient not taking: Reported on 08/06/2017) 120 mL 5 Not Taking  . DULoxetine (CYMBALTA) 30 MG capsule Take 1 capsule (30 mg total) by mouth daily. 30 capsule 1   . famotidine (PEPCID) 20 MG tablet Take 1 tablet (20 mg total) by mouth 2 (two) times daily. 60 tablet 0   . fentaNYL (DURAGESIC - DOSED MCG/HR) 50 MCG/HR Place 1 patch (50 mcg total) every other day onto the skin. 15 patch 0 Taking  . HYDROcodone-acetaminophen (NORCO/VICODIN) 5-325 MG tablet TAKE ONE TABLET TWICE DAILY AS NEEDED FOR MODERATE PAIN (Patient not taking: Reported on 08/06/2017) 60 tablet 0 Not Taking  . JANUVIA 100 MG tablet TAKE ONE TABLET BY MOUTH DAILY 30 tablet 12 Taking  . levothyroxine (SYNTHROID, LEVOTHROID) 137 MCG tablet Take 1 tablet (137 mcg total) by mouth daily. 90 tablet 3 Taking  . MELATONIN PO Take 1-2 tablets by mouth as needed.   Not Taking  . metoprolol tartrate (LOPRESSOR) 50 MG tablet TAKE ONE TABLET TWICE DAILY 60 tablet 5 Taking  . metroNIDAZOLE (FLAGYL) 500 MG tablet Take 1 tablet (500 mg total) by mouth 3 (three) times daily. 30 tablet 0   . omeprazole (PRILOSEC) 20 MG capsule Take 1 capsule (20 mg total) by mouth daily. 30 capsule 0 Taking  . ondansetron (ZOFRAN ODT) 4 MG disintegrating tablet Take 1 tablet (4 mg total) by mouth every 8 (eight) hours as needed for nausea or vomiting. 20 tablet 0   . testosterone (ANDRODERM) 4 MG/24HR PT24 patch Place 1 patch onto the skin daily. (Patient not taking: Reported on 08/18/2017) 30 patch 3 Not Taking  . Vitamin D, Ergocalciferol, (DRISDOL) 50000 units CAPS capsule Take 1 capsule (50,000 Units total) by mouth every 7 (seven) days. (Patient not taking: Reported on 08/06/2017) 12 capsule 4 Not Taking  . zolpidem (AMBIEN) 10 MG tablet Take 1-2 tablets (10-20 mg total) by mouth at bedtime as needed. 60 tablet 5 Taking   Scheduled: . amLODipine  5 mg Oral Daily  . atorvastatin  10  mg Oral QPM  . ciprofloxacin  500 mg Oral BID  . docusate sodium  100 mg Oral BID  . DULoxetine  30 mg Oral Daily  . enoxaparin (LOVENOX) injection  40 mg Subcutaneous Q24H  . famotidine  20 mg Oral BID  . [START ON 08/27/2017] fentaNYL  50 mcg Transdermal Q48H  . levothyroxine  137 mcg Oral QAC breakfast  . linagliptin  5 mg Oral Daily  . metoprolol tartrate  50 mg Oral BID  . metroNIDAZOLE  500  mg Oral TID  . pantoprazole  40 mg Oral Daily    ROS: History obtained from the patient  General ROS: negative for - chills, fatigue, fever, night sweats, weight gain or weight loss Psychological ROS: negative for - behavioral disorder, hallucinations, memory difficulties, mood swings or suicidal ideation Ophthalmic ROS: difficulty with vision without glasses ENT ROS: negative for - epistaxis, nasal discharge, oral lesions, sore throat, tinnitus or vertigo Allergy and Immunology ROS: negative for - hives or itchy/watery eyes Hematological and Lymphatic ROS: negative for - bleeding problems, bruising or swollen lymph nodes Endocrine ROS: negative for - galactorrhea, hair pattern changes, polydipsia/polyuria or temperature intolerance Respiratory ROS: negative for - cough, hemoptysis, shortness of breath or wheezing Cardiovascular ROS: negative for - chest pain, dyspnea on exertion, edema or irregular heartbeat Gastrointestinal ROS: negative for - abdominal pain Genito-Urinary ROS: negative for - dysuria, hematuria, incontinence or urinary frequency/urgency Musculoskeletal ROS: diffuse joint pain, back pain, right leg shorter Neurological ROS: as noted in HPI Dermatological ROS: negative for rash and skin lesion changes  Physical Examination: Blood pressure (!) 134/51, pulse 66, temperature 98.4 F (36.9 C), resp. rate 18, weight 98 kg (216 lb), SpO2 99 %.  HEENT-  Normocephalic, no lesions, without obvious abnormality.  Normal external eye and conjunctiva.  Normal TM's bilaterally.  Normal  auditory canals and external ears. Normal external nose, mucus membranes and septum.  Normal pharynx. Cardiovascular- S1, S2 normal, pulses palpable throughout   Lungs- chest clear, no wheezing, rales, normal symmetric air entry Abdomen- soft, non-tender; bowel sounds normal; no masses,  no organomegaly Extremities- no edema Lymph-no adenopathy palpable Musculoskeletal-arthritic deformity in hands Skin-warm and dry, no hyperpigmentation, vitiligo, or suspicious lesions  Neurological Examination   Mental Status: Alert, oriented, thought content appropriate.  Speech fluent without evidence of aphasia.  Talks to me with his eyes closed.  Able to follow 3 step commands without difficulty. Cranial Nerves: II: Discs flat bilaterally; Visual fields grossly normal, pupils equal, round, reactive to light and accommodation III,IV, VI: ptosis not present, extra-ocular motions intact bilaterally V,VII: smile symmetric, facial light touch sensation normal bilaterally VIII: hearing normal bilaterally IX,X: gag reflex present XI: bilateral shoulder shrug XII: midline tongue extension Motor: Right : Upper extremity   5/5    Left:     Upper extremity   5/5  Lower extremity   5/5     Lower extremity   5/5 Patient reports pain on lifting either leg, left greater than right but able to give full strength for a short period of time.  Jerks whenever legs touch.  When attempts made to examine LE's patient locks his legs at the knees and complains of low back pain.   Sensory: Pinprick and light touch intact throughout, bilaterally.  Patchy vibratory sensation loss in the lower extremities Deep Tendon Reflexes: 2+ and symmetric throughout Plantars: Right: downgoing   Left: downgoing Cerebellar: Normal finger-to-nose and normal heel-to-shin testing bilaterally Gait: not tested due to safety concerns    Laboratory Studies:   Basic Metabolic Panel: Recent Labs  Lab 08/24/17 1033 08/25/17 1650  NA 135 137   K 3.9 4.1  CL 99* 101  CO2 23 24  GLUCOSE 147* 137*  BUN 20 11  CREATININE 0.83 0.70  CALCIUM 8.8* 8.9    Liver Function Tests: Recent Labs  Lab 08/24/17 1033 08/25/17 1650  AST 26 29  ALT 25 24  ALKPHOS 80 74  BILITOT 0.5 0.7  PROT 7.9 8.5*  ALBUMIN 4.0 4.1  Recent Labs  Lab 08/24/17 1033 08/25/17 1650  LIPASE 38 43   No results for input(s): AMMONIA in the last 168 hours.  CBC: Recent Labs  Lab 08/24/17 1033 08/25/17 1650  WBC 8.6 11.0*  NEUTROABS 6.7*  --   HGB 14.8 14.7  HCT 42.5 42.3  MCV 84.6 85.5  PLT 266 288    Cardiac Enzymes: Recent Labs  Lab 08/24/17 1033  TROPONINI <0.03    BNP: Invalid input(s): POCBNP  CBG: Recent Labs  Lab 08/26/17 0808  GLUCAP 132*    Microbiology: Results for orders placed or performed during the hospital encounter of 08/21/16  Chlamydia/NGC rt PCR ([place] only)     Status: None   Collection Time: 08/21/16  9:24 AM  Result Value Ref Range Status   Specimen source GC/Chlam URINE, RANDOM  Final   Chlamydia Tr NOT DETECTED NOT DETECTED Final   N gonorrhoeae NOT DETECTED NOT DETECTED Final    Comment: (NOTE) 100  This methodology has not been evaluated in pregnant women or in 200  patients with a history of hysterectomy. 300 400  This methodology will not be performed on patients less than 67  years of age.     Coagulation Studies: Recent Labs    08/24/17 1033  LABPROT 12.0  INR 0.89    Urinalysis:  Recent Labs  Lab 08/24/17 1259 08/25/17 1642  COLORURINE YELLOW* YELLOW*  LABSPEC 1.013 1.006  PHURINE 7.0 7.0  GLUCOSEU NEGATIVE NEGATIVE  HGBUR NEGATIVE NEGATIVE  BILIRUBINUR NEGATIVE NEGATIVE  KETONESUR NEGATIVE NEGATIVE  PROTEINUR NEGATIVE NEGATIVE  NITRITE NEGATIVE NEGATIVE  LEUKOCYTESUR NEGATIVE NEGATIVE    Lipid Panel:     Component Value Date/Time   CHOL 171 03/22/2017 0951   CHOL 220 (H) 02/23/2012 0416   TRIG 117 03/22/2017 0951   TRIG 104 02/23/2012 0416   HDL 39 (L)  03/22/2017 0951   HDL 36 (L) 02/23/2012 0416   CHOLHDL 4.4 03/22/2017 0951   VLDL 21 02/23/2012 0416   LDLCALC 109 (H) 03/22/2017 0951   LDLCALC 163 (H) 02/23/2012 0416    HgbA1C:  Lab Results  Component Value Date   HGBA1C 6.5 08/06/2017    Urine Drug Screen:      Component Value Date/Time   LABOPIA NONE DETECTED 08/24/2017 1259   COCAINSCRNUR NONE DETECTED 08/24/2017 1259   LABBENZ NONE DETECTED 08/24/2017 1259   AMPHETMU NONE DETECTED 08/24/2017 1259   THCU NONE DETECTED 08/24/2017 1259   LABBARB NONE DETECTED 08/24/2017 1259    Alcohol Level:  Recent Labs  Lab 08/24/17 1033  ETH <10    Other results: EKG: sinus rhythm at 77 bpm.  Imaging: Dg Chest 2 View  Result Date: 08/26/2017 CLINICAL DATA:  Pneumonia. EXAM: CHEST  2 VIEW COMPARISON:  08/24/2017. FINDINGS: Mediastinum and hilar structures normal. Borderline cardiomegaly. Normal pulmonary vascularity. Low lung volumes with mild basilar atelectasis. No pleural effusion or pneumothorax. Degenerative changes thoracic spine. IMPRESSION: 1.  Borderline cardiomegaly.  No pulmonary venous congestion. 2.  Low lung volumes with mild basilar atelectasis. Electronically Signed   By: Marcello Moores  Register   On: 08/26/2017 09:17   Dg Chest 2 View  Result Date: 08/24/2017 CLINICAL DATA:  61 year old male with cough and fatigue. Left chest pain. Left headache. Left shoulder and arm pain. Blacked are a stool since last night. EXAM: CHEST  2 VIEW COMPARISON:  05/05/2016 and earlier. FINDINGS: Seated AP and lateral views of the chest. Lung volumes are stable and low-normal. Mediastinal contours remain normal. Visualized  tracheal air column is within normal limits. No pneumothorax or pleural effusion. Lung parenchyma appears stable and clear. Negative visible bowel gas pattern. No pneumoperitoneum. No acute osseous abnormality identified. IMPRESSION: Negative.  No acute cardiopulmonary abnormality. Electronically Signed   By: Genevie Ann M.D.    On: 08/24/2017 13:50   Mr Brain Wo Contrast (neuro Protocol)  Result Date: 08/24/2017 CLINICAL DATA:  Left-sided chest pain that began this morning. Left-sided shoulder and arm pain and tingling for the last few days. EXAM: MRI HEAD WITHOUT CONTRAST TECHNIQUE: Multiplanar, multiecho pulse sequences of the brain and surrounding structures were obtained without intravenous contrast. COMPARISON:  Head CT from earlier today.  Brain MRI 09/22/2005 FINDINGS: Brain: No acute infarction, hemorrhage, hydrocephalus, extra-axial collection or mass lesion. Asymmetric lateral ventricular volume, larger at the right lateral body, chronic and developmental appearing. Vascular: Major flow voids are preserved Skull and upper cervical spine: Negative for marrow lesion. C2-3 disc degeneration. Sinuses/Orbits: No acute finding. Mild mucosal thickening in the ethmoids. Other: Patient terminated the exam before axial T1 and coronal T2 weighted imaging could be obtained. Of the acquired sequences, the latter are motion degraded. IMPRESSION: Negative partial and motion degraded exam. No infarct or other explanation for symptoms. Electronically Signed   By: Monte Fantasia M.D.   On: 08/24/2017 12:43   Ct Abdomen Pelvis W Contrast  Result Date: 08/25/2017 CLINICAL DATA:  61 year old male presents status post multiple falls. History of Guillan-Barre. Currently complains of left lower quadrant abdominal pain. EXAM: CT ABDOMEN AND PELVIS WITH CONTRAST TECHNIQUE: Multidetector CT imaging of the abdomen and pelvis was performed using the standard protocol following bolus administration of intravenous contrast. CONTRAST:  141mL ISOVUE-300 IOPAMIDOL (ISOVUE-300) INJECTION 61% COMPARISON:  CT report from 07/23/2003 FINDINGS: Lower chest: Normal heart size without pericardial effusion. No active pulmonary disease. Hepatobiliary: No enhancing hepatic mass or biliary dilatation. Normal gallbladder without stones. Pancreas: Normal Spleen:  Normal Adrenals/Urinary Tract: Normal bilateral adrenal glands. Tiny too small to further characterize hypodensity in the posterior interpolar cortex of the left kidney measuring 4 mm. Similar 3 mm hypodensity in the upper pole the right kidney. Statistically these are more likely to represent cysts. No obstructive uropathy. Unremarkable urinary bladder. Stomach/Bowel: Acute distal descending diverticulitis without bowel obstruction or abscess. The stomach, small intestine, and appendix are normal. Vascular/Lymphatic: No significant vascular findings are present. No enlarged abdominal or pelvic lymph nodes. Reproductive: Enlarged prostate with more focal hyperdense nodule impressing upon the base of the bladder measuring 11 x 9 x 14 mm. Other: Bilateral fat containing inguinal hernias. 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 No acute nor suspicious osseous lesions. Bilateral hip and sacroiliac joint osteoarthritis. Lower lumbar degenerative facet arthropathy. IMPRESSION: 1. Acute uncomplicated distal descending diverticulitis with pericolonic inflammation. No bowel obstruction, abscess nor free air. 2. Tiny too small to characterize bilateral renal hypodensities statistically consistent tiny cysts. 3. Enlarged prostate with more focal hyperdense nodule impressing upon the base of the bladder measuring 11 x 9 x 14 mm. Electronically Signed   By: Ashley Royalty M.D.   On: 08/25/2017 19:37     Assessment/Plan: 61 year old male with history of GBS presenting with syncope and back pain/shaking spell.  Based on history I can not connect these two symptoms.  Initial syncopal episode was not witnessed but unlikely that patient fell forward onto a deck since without injury or bruising.  Other witnessed episodes without tonic clonic activity.  Unclear etiology.  Shaking episode not likely epileptic since patient with maintained consciousness  but bilateral jerking.  Suspect this is likely secondary to spasm from back pain.   Patient also appears anxious.  Unclear how he is using his outpatient medications.  UDS negative but patient prescribed Fentanyl and Norco.    Recommendations: 1.  MRI of the thoracic and lumbar spines without contrast 2.  EEG 3.  Telemetry 4.  Orthostatic vitals   Alexis Goodell, MD Neurology [phone] 08/26/2017, 11:18 AM

## 2017-08-26 NOTE — Progress Notes (Signed)
Patient having tremors where he is shaking bed.  VSS, BS normal.  Dr. Marthann Schiller paged and ativan ordered and a repeat chest xray.

## 2017-08-26 NOTE — ED Triage Notes (Signed)
Pt was recently discharged 1 hr ago from ED dx with diverticulitis and per sister "passed out in the car".

## 2017-08-26 NOTE — Progress Notes (Signed)
RN unable to give morning medications because he has been off the floor for testing.  Will give daily medications when he returns.

## 2017-08-27 DIAGNOSIS — R251 Tremor, unspecified: Secondary | ICD-10-CM | POA: Diagnosis not present

## 2017-08-27 LAB — GLUCOSE, CAPILLARY
Glucose-Capillary: 149 mg/dL — ABNORMAL HIGH (ref 65–99)
Glucose-Capillary: 110 mg/dL — ABNORMAL HIGH (ref 65–99)

## 2017-08-27 MED ORDER — AMOXICILLIN-POT CLAVULANATE 875-125 MG PO TABS
1.0000 | ORAL_TABLET | Freq: Two times a day (BID) | ORAL | Status: DC
  Administered 2017-08-27 – 2017-08-28 (×3): 1 via ORAL
  Filled 2017-08-27 (×3): qty 1

## 2017-08-27 MED ORDER — INSULIN ASPART 100 UNIT/ML ~~LOC~~ SOLN
0.0000 [IU] | Freq: Three times a day (TID) | SUBCUTANEOUS | Status: DC
  Administered 2017-08-27: 1 [IU] via SUBCUTANEOUS
  Filled 2017-08-27: qty 1

## 2017-08-27 MED ORDER — GABAPENTIN 300 MG PO CAPS
600.0000 mg | ORAL_CAPSULE | Freq: Three times a day (TID) | ORAL | Status: DC
  Administered 2017-08-27 – 2017-08-28 (×2): 600 mg via ORAL
  Filled 2017-08-27 (×2): qty 2

## 2017-08-27 NOTE — Evaluation (Signed)
Physical Therapy Evaluation Patient Details Name: Sladen Plancarte MRN: [id number] DOB: 03-26-1956 Today's Date: 08/27/2017   History of Present Illness  61 y/o male with past medical history of collagen vascular disease, Guillain-Barr, thoracic aortic aneurysm, hypertension and diabetes here after collapsing in the parking lot following evaluation in the emergency department for diverticulitis.  The patient was discharged from the emergency department in stable condition and made it through the parking lot to be seated in the passenger seat his vehicle when he fell forward hitting his head on the dashboard.  His sister reports that the patient was briefly unconscious.  Evaluation in ED was unremarkable.  Notably his first emergency department encounter the patient had on 2 fentanyl patches which were removed.  He had some withdrawal symptoms immediately thereafter which prompted the emergency department staff to give him Dilaudid.   Clinical Impression  Pt feeling poorly, but report feeling much better than any time in the last 24 hours.  He was able to ambulate 250 ft and negotiate up/down steps but needed multiple rest breaks and frequent light use of UEs on hallway railing.  He did not have any overt safety concerns, but lacked confidence and is not at his baseline.  Pt agrees to use cane or walker initially once home and is open to the idea of going to outpatient PT (not interested in HHPT).      Follow Up Recommendations Outpatient PT(pt not interested in HHPT at this time)    Equipment Recommendations  (discussed him using his cane initially )    Recommendations for Other Services       Precautions / Restrictions Precautions Precautions: Fall Restrictions Weight Bearing Restrictions: No      Mobility  Bed Mobility Overal bed mobility: Modified Independent             General bed mobility comments: Pt slow but independent with getting to EOB  Transfers Overall  transfer level: Modified independent Equipment used: None             General transfer comment: Pt initially unsteady and needing to hold foot of bed to maintain balance  Ambulation/Gait Ambulation/Gait assistance: Supervision Ambulation Distance (Feet): 250 Feet Assistive device: None       General Gait Details: Pt with slow, guarded ambulation.  He did not have any LOBs, but needed multiple brief rest breaks holding the hallway rail, and needed the rail occasionally t/o the effort.  He was fatigued with ambualtion, but vitals remained stable.  Pt with baseline limp secondary to leg length discrepancy (wears lifted shoe normally)  Stairs Stairs: Yes Stairs assistance: Supervision Stair Management: One rail Left Number of Stairs: 4 General stair comments: Pt able to negotiate up/down steps with single UE use  Wheelchair Mobility    Modified Rankin (Stroke Patients Only)       Balance Overall balance assessment: Needs assistance Sitting-balance support: No upper extremity supported Sitting balance-Leahy Scale: Good     Standing balance support: Single extremity supported Standing balance-Leahy Scale: Fair Standing balance comment: Pt with no overt LOBs, but generally lacked confidence and was not overly stable                             Pertinent Vitals/Pain Pain Assessment: No/denies pain    Home Living Family/patient expects to be discharged to:: Private residence Living Arrangements: Alone Available Help at Discharge: Friend(s) Type of Home: House Home Access: Stairs to enter  Entrance Stairs-Number of Steps: 2 Home Layout: One level Home Equipment: Micro - 2 wheels;Cane - single point      Prior Function Level of Independence: Independent         Comments: Pt able to take care of all he needs, run errands, etc, but has been weaker recently     Hand Dominance        Extremity/Trunk Assessment   Upper Extremity  Assessment Upper Extremity Assessment: Overall WFL for tasks assessed    Lower Extremity Assessment Lower Extremity Assessment: Generalized weakness(L LE weaker than R )       Communication   Communication: No difficulties  Cognition Arousal/Alertness: Awake/alert Behavior During Therapy: WFL for tasks assessed/performed Overall Cognitive Status: Within Functional Limits for tasks assessed                                        General Comments      Exercises     Assessment/Plan    PT Assessment Patient needs continued PT services  PT Problem List Decreased strength;Decreased activity tolerance;Decreased balance;Decreased mobility;Decreased knowledge of use of DME;Decreased safety awareness       PT Treatment Interventions Gait training;DME instruction;Stair training;Functional mobility training;Therapeutic activities;Therapeutic exercise;Balance training;Patient/family education;Neuromuscular re-education    PT Goals (Current goals can be found in the Care Plan section)  Acute Rehab PT Goals Patient Stated Goal: go home PT Goal Formulation: With patient Time For Goal Achievement: 09/10/17 Potential to Achieve Goals: Fair    Frequency Min 2X/week   Barriers to discharge        Co-evaluation               AM-PAC PT "6 Clicks" Daily Activity  Outcome Measure Difficulty turning over in bed (including adjusting bedclothes, sheets and blankets)?: None Difficulty moving from lying on back to sitting on the side of the bed? : None Difficulty sitting down on and standing up from a chair with arms (e.g., wheelchair, bedside commode, etc,.)?: None Help needed moving to and from a bed to chair (including a wheelchair)?: None Help needed walking in hospital room?: A Little Help needed climbing 3-5 steps with a railing? : A Little 6 Click Score: 22    End of Session Equipment Utilized During Treatment: Gait belt Activity Tolerance: Patient tolerated  treatment well;Patient limited by fatigue Patient left: with bed alarm set;with call bell/phone within reach;with family/visitor present Nurse Communication: Mobility status PT Visit Diagnosis: Muscle weakness (generalized) (M62.81);Difficulty in walking, not elsewhere classified (R26.2)    Time: 8242-3536 PT Time Calculation (min) (ACUTE ONLY): 31 min   Charges:   PT Evaluation $PT Eval Low Complexity: 1 Low     PT G Codes:   PT G-Codes **NOT FOR INPATIENT CLASS** Functional Assessment Tool Used: AM-PAC 6 Clicks Basic Mobility Functional Limitation: Mobility: Walking and moving around Mobility: Walking and Moving Around Current Status (R4431): At least 20 percent but less than 40 percent impaired, limited or restricted Mobility: Walking and Moving Around Goal Status [phone]): At least 1 percent but less than 20 percent impaired, limited or restricted    Kreg Shropshire, DPT 08/27/2017, 5:25 PM

## 2017-08-27 NOTE — Progress Notes (Signed)
Subjective: Patient reports feeling "fuzzy" this morning.  No changes otherwise. Frequent LE jerking noted.    Objective: Current vital signs: BP 140/62   Pulse 71   Temp 98.5 F (36.9 C) (Oral)   Resp 19   Wt 95.7 kg (211 lb)   SpO2 98%   BMI 29.43 kg/m  Vital signs in last 24 hours: Temp:  [98.4 F (36.9 C)-99 F (37.2 C)] 98.5 F (36.9 C) (12/14 0804) Pulse Rate:  [71-82] 71 (12/14 0804) Resp:  [18-20] 19 (12/14 0443) BP: (133-140)/(58-93) 140/62 (12/14 0804) SpO2:  [95 %-98 %] 98 % (12/14 0804) Weight:  [95.7 kg (211 lb)] 95.7 kg (211 lb) (12/14 0445)  Intake/Output from previous day: 12/13 0701 - 12/14 0700 In: 1767.5 [P.O.:240; I.V.:1527.5] Out: 900 [Urine:900] Intake/Output this shift: Total I/O In: -  Out: 200 [Urine:200] Nutritional status: Diet Carb Modified Fluid consistency: Thin; Room service appropriate? Yes  Neurologic Exam: Mental Status: Speech fluent without evidence of aphasia.  Sitting in bed with his eyes closed but opens eyes when requested and responds appropriately to all questions.   Able to follow 3 step commands without difficulty. Cranial Nerves: II: Discs flat bilaterally; Visual fields grossly normal, pupils equal, round, reactive to light and accommodation III,IV, VI: ptosis not present, extra-ocular motions intact bilaterally V,VII: smile symmetric, facial light touch sensation normal bilaterally VIII: hearing normal bilaterally IX,X: gag reflex present XI: bilateral shoulder shrug XII: midline tongue extension Motor: Right :  Upper extremity   5/5                                      Left:     Upper extremity   5/5             Lower extremity   5/5                                                  Lower extremity   5/5 Patient reports pain on lifting either leg, left greater than right but able to give full strength for a short period of time.  Intermittent leg jerking noted.       Lab Results: Basic Metabolic Panel: Recent Labs   Lab 08/24/17 1033 08/25/17 1650  NA 135 137  K 3.9 4.1  CL 99* 101  CO2 23 24  GLUCOSE 147* 137*  BUN 20 11  CREATININE 0.83 0.70  CALCIUM 8.8* 8.9    Liver Function Tests: Recent Labs  Lab 08/24/17 1033 08/25/17 1650  AST 26 29  ALT 25 24  ALKPHOS 80 74  BILITOT 0.5 0.7  PROT 7.9 8.5*  ALBUMIN 4.0 4.1   Recent Labs  Lab 08/24/17 1033 08/25/17 1650  LIPASE 38 43   No results for input(s): AMMONIA in the last 168 hours.  CBC: Recent Labs  Lab 08/24/17 1033 08/25/17 1650  WBC 8.6 11.0*  NEUTROABS 6.7*  --   HGB 14.8 14.7  HCT 42.5 42.3  MCV 84.6 85.5  PLT 266 288    Cardiac Enzymes: Recent Labs  Lab 08/24/17 1033  TROPONINI <0.03    Lipid Panel: No results for input(s): CHOL, TRIG, HDL, CHOLHDL, VLDL, LDLCALC in the last 168 hours.  CBG: Recent Labs  Lab 08/26/17 0808  GLUCAP  132*    Microbiology: Results for orders placed or performed during the hospital encounter of 08/21/16  [place] rt PCR ([facility] only)     Status: None   Collection Time: 08/21/16  9:24 AM  Result Value Ref Range Status   Specimen source GC/Chlam URINE, RANDOM  Final   Chlamydia Tr NOT DETECTED NOT DETECTED Final   N gonorrhoeae NOT DETECTED NOT DETECTED Final    Comment: (NOTE) 100  This methodology has not been evaluated in pregnant women or in 200  patients with a history of hysterectomy. 300 400  This methodology will not be performed on patients less than 58  years of age.     Coagulation Studies: No results for input(s): LABPROT, INR in the last 72 hours.  Imaging: Dg Chest 2 View  Result Date: 08/26/2017 CLINICAL DATA:  Pneumonia. EXAM: CHEST  2 VIEW COMPARISON:  08/24/2017. FINDINGS: Mediastinum and hilar structures normal. Borderline cardiomegaly. Normal pulmonary vascularity. Low lung volumes with mild basilar atelectasis. No pleural effusion or pneumothorax. Degenerative changes thoracic spine. IMPRESSION: 1.  Borderline cardiomegaly.  No  pulmonary venous congestion. 2.  Low lung volumes with mild basilar atelectasis. Electronically Signed   By: Marcello Moores  Register   On: 08/26/2017 09:17   Mr Thoracic Spine Wo Contrast  Result Date: 08/27/2017 CLINICAL DATA:  Initial evaluation for acute back pain, shaking spells, syncope. EXAM: MRI THORACIC AND LUMBAR SPINE WITHOUT CONTRAST TECHNIQUE: Multiplanar and multiecho pulse sequences of the thoracic and lumbar spine were obtained without intravenous contrast. COMPARISON:  None. FINDINGS: MRI THORACIC SPINE FINDINGS Alignment: Vertebral bodies normally aligned with preservation of the normal thoracic kyphosis. No listhesis. Vertebrae: Vertebral body heights well maintained without evidence for acute or chronic fracture. Bone marrow signal intensity within normal limits. No discrete or worrisome osseous lesions. No abnormal marrow edema. Cord: Short-segment flattening of the right posterior aspect of the thoracic spinal cord present at the level of T4 (series 7, image 12). Findings suggest a possible underlying lesion such is a subdural or intradural arachnoid cyst, not well delineated due to motion through this region. Similarly, an exact measurement is difficult to discern. Cord is mildly flattened at this level without cord signal changes. This is likely incidental in nature and of no clinical significance. Appearance of the thoracic spinal cord is otherwise normal. No cord signal changes or other abnormality. Paraspinal and other soft tissues: Paraspinous soft tissues within normal limits. Visualized lungs are clear. Visualized visceral structures are normal. Disc levels: T3-4: Small central disc protrusion indents the ventral thecal sac without significant stenosis. T6-7: Tiny central disc protrusion minimally indents the ventral thecal sac without stenosis. T9-10: Mild degenerative disc bulging without significant stenosis. No other significant degenerative changes within the thoracic spine. No canal  or foraminal stenosis. MRI LUMBAR SPINE FINDINGS Segmentation: Normal segmentation. Lowest well-formed disc labeled the L5-S1 level. Alignment: Trace retrolisthesis of L2 on L3. Vertebral bodies otherwise normally aligned with preservation of the normal lumbar lordosis. Vertebrae: Vertebral body heights are maintained without evidence for acute or chronic fracture. Bone marrow signal intensity within normal limits. Few small benign hemangiomas noted. No worrisome osseous lesions. Conus medullaris and cauda equina: Conus extends to the L1 level. Conus and cauda equina appear normal. Paraspinal and other soft tissues: Paraspinous soft tissues within normal limits. Visualized visceral structures are normal. Disc levels: L1-2: Mild diffuse disc bulge with disc desiccation. No significant stenosis. No impingement. L2-3: Trace retrolisthesis. Diffuse disc bulge with disc desiccation. Posterior annular fissure. Mild to  moderate facet with ligamentum flavum hypertrophy. Superimposed 5 mm synovial cyst present at the anteromedial aspect of the left L2-3 facet (series 14, image 13). Resultant mild canal with mild to moderate bilateral lateral recess stenosis. Foramina remain patent. L3-4: Mild diffuse disc bulge with disc desiccation. Mild to moderate facet and ligament flavum hypertrophy. Mild canal with mild to moderate bilateral subarticular stenosis. Borderline mild L3 foraminal narrowing. L4-5: Mild diffuse disc bulge with disc desiccation. Moderate facet arthrosis with ligamentum flavum hypertrophy. Mild reactive edema about the bilateral L4-5 facets. Moderate canal with moderate to severe bilateral subarticular stenosis. Mild left L4 foraminal narrowing. L5-S1: Disc desiccation with superimposed small central disc protrusion. Protruding disc closely approximates the descending S1 nerve roots without frank neural impingement or displacement. No stenosis. Mild facet hypertrophy. IMPRESSION: MR THORACIC SPINE IMPRESSION  1. No acute abnormality within the thoracic spine. 2. Small disc protrusion/disc bulging at C3-4, C6-7, and T9-10 without stenosis. 3. Short-segment flattening of the right posterior cord at the level of T4. Findings suggests the presence of a small subdural and/or intradural arachnoid cyst at this level, not well delineated on this exam due to motion. No associated cord signal changes or other abnormality. This finding is most likely incidental in nature, and of doubtful clinical significance. MR LUMBAR SPINE IMPRESSION 1. No acute abnormality within the lumbar spine. 2. Degenerative disc bulging with facet arthrosis at L4-5 with resultant moderate canal with moderate to severe bilateral subarticular stenosis. 3. Small central disc protrusion at L5-S1, closely approximating the descending S1 nerve roots without frank neural impingement or displacement. 4. Multifactorial degenerative changes at L2-3 and L3-4 with resultant mild canal with mild to moderate subarticular stenosis. Electronically Signed   By: Jeannine Boga M.D.   On: 08/27/2017 01:33   Mr Lumbar Spine Wo Contrast  Result Date: 08/27/2017 CLINICAL DATA:  Initial evaluation for acute back pain, shaking spells, syncope. EXAM: MRI THORACIC AND LUMBAR SPINE WITHOUT CONTRAST TECHNIQUE: Multiplanar and multiecho pulse sequences of the thoracic and lumbar spine were obtained without intravenous contrast. COMPARISON:  None. FINDINGS: MRI THORACIC SPINE FINDINGS Alignment: Vertebral bodies normally aligned with preservation of the normal thoracic kyphosis. No listhesis. Vertebrae: Vertebral body heights well maintained without evidence for acute or chronic fracture. Bone marrow signal intensity within normal limits. No discrete or worrisome osseous lesions. No abnormal marrow edema. Cord: Short-segment flattening of the right posterior aspect of the thoracic spinal cord present at the level of T4 (series 7, image 12). Findings suggest a possible  underlying lesion such is a subdural or intradural arachnoid cyst, not well delineated due to motion through this region. Similarly, an exact measurement is difficult to discern. Cord is mildly flattened at this level without cord signal changes. This is likely incidental in nature and of no clinical significance. Appearance of the thoracic spinal cord is otherwise normal. No cord signal changes or other abnormality. Paraspinal and other soft tissues: Paraspinous soft tissues within normal limits. Visualized lungs are clear. Visualized visceral structures are normal. Disc levels: T3-4: Small central disc protrusion indents the ventral thecal sac without significant stenosis. T6-7: Tiny central disc protrusion minimally indents the ventral thecal sac without stenosis. T9-10: Mild degenerative disc bulging without significant stenosis. No other significant degenerative changes within the thoracic spine. No canal or foraminal stenosis. MRI LUMBAR SPINE FINDINGS Segmentation: Normal segmentation. Lowest well-formed disc labeled the L5-S1 level. Alignment: Trace retrolisthesis of L2 on L3. Vertebral bodies otherwise normally aligned with preservation of the normal lumbar lordosis. Vertebrae:  Vertebral body heights are maintained without evidence for acute or chronic fracture. Bone marrow signal intensity within normal limits. Few small benign hemangiomas noted. No worrisome osseous lesions. Conus medullaris and cauda equina: Conus extends to the L1 level. Conus and cauda equina appear normal. Paraspinal and other soft tissues: Paraspinous soft tissues within normal limits. Visualized visceral structures are normal. Disc levels: L1-2: Mild diffuse disc bulge with disc desiccation. No significant stenosis. No impingement. L2-3: Trace retrolisthesis. Diffuse disc bulge with disc desiccation. Posterior annular fissure. Mild to moderate facet with ligamentum flavum hypertrophy. Superimposed 5 mm synovial cyst present at the  anteromedial aspect of the left L2-3 facet (series 14, image 13). Resultant mild canal with mild to moderate bilateral lateral recess stenosis. Foramina remain patent. L3-4: Mild diffuse disc bulge with disc desiccation. Mild to moderate facet and ligament flavum hypertrophy. Mild canal with mild to moderate bilateral subarticular stenosis. Borderline mild L3 foraminal narrowing. L4-5: Mild diffuse disc bulge with disc desiccation. Moderate facet arthrosis with ligamentum flavum hypertrophy. Mild reactive edema about the bilateral L4-5 facets. Moderate canal with moderate to severe bilateral subarticular stenosis. Mild left L4 foraminal narrowing. L5-S1: Disc desiccation with superimposed small central disc protrusion. Protruding disc closely approximates the descending S1 nerve roots without frank neural impingement or displacement. No stenosis. Mild facet hypertrophy. IMPRESSION: MR THORACIC SPINE IMPRESSION 1. No acute abnormality within the thoracic spine. 2. Small disc protrusion/disc bulging at C3-4, C6-7, and T9-10 without stenosis. 3. Short-segment flattening of the right posterior cord at the level of T4. Findings suggests the presence of a small subdural and/or intradural arachnoid cyst at this level, not well delineated on this exam due to motion. No associated cord signal changes or other abnormality. This finding is most likely incidental in nature, and of doubtful clinical significance. MR LUMBAR SPINE IMPRESSION 1. No acute abnormality within the lumbar spine. 2. Degenerative disc bulging with facet arthrosis at L4-5 with resultant moderate canal with moderate to severe bilateral subarticular stenosis. 3. Small central disc protrusion at L5-S1, closely approximating the descending S1 nerve roots without frank neural impingement or displacement. 4. Multifactorial degenerative changes at L2-3 and L3-4 with resultant mild canal with mild to moderate subarticular stenosis. Electronically Signed   By:  Jeannine Boga M.D.   On: 08/27/2017 01:33   Ct Abdomen Pelvis W Contrast  Result Date: 08/25/2017 CLINICAL DATA:  61 year old male presents status post multiple falls. History of Guillan-Barre. Currently complains of left lower quadrant abdominal pain. EXAM: CT ABDOMEN AND PELVIS WITH CONTRAST TECHNIQUE: Multidetector CT imaging of the abdomen and pelvis was performed using the standard protocol following bolus administration of intravenous contrast. CONTRAST:  140mL ISOVUE-300 IOPAMIDOL (ISOVUE-300) INJECTION 61% COMPARISON:  CT report from 07/23/2003 FINDINGS: Lower chest: Normal heart size without pericardial effusion. No active pulmonary disease. Hepatobiliary: No enhancing hepatic mass or biliary dilatation. Normal gallbladder without stones. Pancreas: Normal Spleen: Normal Adrenals/Urinary Tract: Normal bilateral adrenal glands. Tiny too small to further characterize hypodensity in the posterior interpolar cortex of the left kidney measuring 4 mm. Similar 3 mm hypodensity in the upper pole the right kidney. Statistically these are more likely to represent cysts. No obstructive uropathy. Unremarkable urinary bladder. Stomach/Bowel: Acute distal descending diverticulitis without bowel obstruction or abscess. The stomach, small intestine, and appendix are normal. Vascular/Lymphatic: No significant vascular findings are present. No enlarged abdominal or pelvic lymph nodes. Reproductive: Enlarged prostate with more focal hyperdense nodule impressing upon the base of the bladder measuring 11 x 9 x 14  mm. Other: Bilateral fat containing inguinal hernias. 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 No acute nor suspicious osseous lesions. Bilateral hip and sacroiliac joint osteoarthritis. Lower lumbar degenerative facet arthropathy. IMPRESSION: 1. Acute uncomplicated distal descending diverticulitis with pericolonic inflammation.  No bowel obstruction, abscess nor free air. 2. Tiny too small to characterize bilateral renal hypodensities statistically consistent tiny cysts. 3. Enlarged prostate with more focal hyperdense nodule impressing upon the base of the bladder measuring 11 x 9 x 14 mm. Electronically Signed   By: Ashley Royalty M.D.   On: 08/25/2017 19:37    Medications:  I have reviewed the patient's current medications. Scheduled: . amLODipine  5 mg Oral Daily  . amoxicillin-clavulanate  1 tablet Oral Q12H  . atorvastatin  10 mg Oral QPM  . ciprofloxacin  500 mg Oral BID  . docusate sodium  100 mg Oral BID  . DULoxetine  30 mg Oral Daily  . enoxaparin (LOVENOX) injection  40 mg Subcutaneous Q24H  . famotidine  20 mg Oral BID  . fentaNYL  50 mcg Transdermal Q48H  . levothyroxine  137 mcg Oral QAC breakfast  . metoprolol tartrate  50 mg Oral BID  . metroNIDAZOLE  500 mg Oral TID  . pantoprazole  40 mg Oral Daily    Assessment/Plan: Patient reports being "fuzzy" this morning.  Jerking of legs noted intermittently.  Receiving Ativan prn.  Got 3 doses yesterday and had his sleeping pill as well. EEG shows no epileptiform activity. MRI of the thoracic and lumbar spines show no evidence of cord compromise that would explain the patient's symptoms.    Recommendations: 1.  Would D/C ativan 2.  Would consider decrease in Fentanyl to 32mcg patch if patient continues to be "fuzzy" 3.  Once mental status cleared would use Baclofen instead of Ativan for LE symptoms.     LOS: 0 days   Alexis Goodell, MD Neurology [phone] 08/27/2017  11:20 AM

## 2017-08-27 NOTE — Progress Notes (Signed)
[place] [facility] [place] NAME: Keith Carpenter    MR#:  [id number]  DATE OF BIRTH:  [birth year]  SUBJECTIVE:  CHIEF COMPLAINT:   Chief Complaint  Patient presents with  . Loss of Consciousness    Multiple different complains include numbness on left side of body then abd pain then syncopal episode- which led multiple ER presentations in last 3-4 days. Finally admitted. Note some diverticulitis. Had some vigorous jerky movements in morning 08/26/17- responded to IV ativan small dose.  REVIEW OF SYSTEMS:  CONSTITUTIONAL: No fever,positive for fatigue or weakness.  EYES: No blurred or double vision.  EARS, NOSE, AND THROAT: No tinnitus or ear pain.  RESPIRATORY: No cough, shortness of breath, wheezing or hemoptysis.  CARDIOVASCULAR: No chest pain, orthopnea, edema.  GASTROINTESTINAL: No nausea, vomiting, diarrhea , have abdominal pain.  GENITOURINARY: No dysuria, hematuria.  ENDOCRINE: No polyuria, nocturia,  HEMATOLOGY: No anemia, easy bruising or bleeding SKIN: No rash or lesion. MUSCULOSKELETAL: No joint pain or arthritis.   NEUROLOGIC: No tingling, numbness, weakness. Jerky movements. PSYCHIATRY: No anxiety or depression.   ROS  DRUG ALLERGIES:   Allergies  Allergen Reactions  . Influenza Vaccines Other (See Comments)    Guillain Barre  . Declomycin [Demeclocycline]   . Demeclocycline Hcl   . Ginger   . Glipizide     Nausea   . Metformin And Related Nausea Only    Metformin IR 500 BID  . Other     Other reaction(s): Other (See Comments) Uncoded Allergy. Allergen: Other Allergy: See Patient Chart for Details, Other Reaction: Other reaction  . Remicade [Infliximab]   . Suvorexant Other (See Comments)    vision changes  . Clarithromycin Rash  . Pioglitazone Itching, Swelling and Rash    VITALS:  Blood pressure 136/70, pulse 88, temperature 98.5 F (36.9 C), temperature source Oral, resp. rate 20, weight 95.7 kg (211 lb), SpO2 96  %.  PHYSICAL EXAMINATION:  GENERAL:  61 y.o.-year-old patient lying in the bed with no acute distress.  EYES: Pupils equal, round, reactive to light and accommodation. No scleral icterus. Extraocular muscles intact.  HEENT: Head atraumatic, normocephalic. Oropharynx and nasopharynx clear.  NECK:  Supple, no jugular venous distention. No thyroid enlargement, no tenderness.  LUNGS: Normal breath sounds bilaterally, no wheezing, rales,rhonchi or crepitation. No use of accessory muscles of respiration.  CARDIOVASCULAR: S1, S2 normal. No murmurs, rubs, or gallops.  ABDOMEN: Soft, tender, nondistended. Bowel sounds present. No organomegaly or mass.  EXTREMITIES: No pedal edema, cyanosis, or clubbing.  NEUROLOGIC: Cranial nerves II through XII are intact. Muscle strength 4/5 in all extremities. Sensation intact. Gait not checked.  PSYCHIATRIC: The patient is alert and oriented x 3.  SKIN: No obvious rash, lesion, or ulcer.   Physical Exam LABORATORY PANEL:   CBC Recent Labs  Lab 08/25/17 1650  WBC 11.0*  HGB 14.7  HCT 42.3  PLT 288   ------------------------------------------------------------------------------------------------------------------  Chemistries  Recent Labs  Lab 08/25/17 1650  NA 137  K 4.1  CL 101  CO2 24  GLUCOSE 137*  BUN 11  CREATININE 0.70  CALCIUM 8.9  AST 29  ALT 24  ALKPHOS 74  BILITOT 0.7   ------------------------------------------------------------------------------------------------------------------  Cardiac Enzymes Recent Labs  Lab 08/24/17 1033  TROPONINI <0.03   ------------------------------------------------------------------------------------------------------------------  RADIOLOGY:  Dg Chest 2 View  Result Date: 08/26/2017 CLINICAL DATA:  Pneumonia. EXAM: CHEST  2 VIEW COMPARISON:  08/24/2017. FINDINGS: Mediastinum and hilar structures normal. Borderline cardiomegaly. Normal  pulmonary vascularity. Low lung volumes with mild  basilar atelectasis. No pleural effusion or pneumothorax. Degenerative changes thoracic spine. IMPRESSION: 1.  Borderline cardiomegaly.  No pulmonary venous congestion. 2.  Low lung volumes with mild basilar atelectasis. Electronically Signed   By: Marcello Moores  Register   On: 08/26/2017 09:17   Mr Thoracic Spine Wo Contrast  Result Date: 08/27/2017 CLINICAL DATA:  Initial evaluation for acute back pain, shaking spells, syncope. EXAM: MRI THORACIC AND LUMBAR SPINE WITHOUT CONTRAST TECHNIQUE: Multiplanar and multiecho pulse sequences of the thoracic and lumbar spine were obtained without intravenous contrast. COMPARISON:  None. FINDINGS: MRI THORACIC SPINE FINDINGS Alignment: Vertebral bodies normally aligned with preservation of the normal thoracic kyphosis. No listhesis. Vertebrae: Vertebral body heights well maintained without evidence for acute or chronic fracture. Bone marrow signal intensity within normal limits. No discrete or worrisome osseous lesions. No abnormal marrow edema. Cord: Short-segment flattening of the right posterior aspect of the thoracic spinal cord present at the level of T4 (series 7, image 12). Findings suggest a possible underlying lesion such is a subdural or intradural arachnoid cyst, not well delineated due to motion through this region. Similarly, an exact measurement is difficult to discern. Cord is mildly flattened at this level without cord signal changes. This is likely incidental in nature and of no clinical significance. Appearance of the thoracic spinal cord is otherwise normal. No cord signal changes or other abnormality. Paraspinal and other soft tissues: Paraspinous soft tissues within normal limits. Visualized lungs are clear. Visualized visceral structures are normal. Disc levels: T3-4: Small central disc protrusion indents the ventral thecal sac without significant stenosis. T6-7: Tiny central disc protrusion minimally indents the ventral thecal sac without stenosis. T9-10:  Mild degenerative disc bulging without significant stenosis. No other significant degenerative changes within the thoracic spine. No canal or foraminal stenosis. MRI LUMBAR SPINE FINDINGS Segmentation: Normal segmentation. Lowest well-formed disc labeled the L5-S1 level. Alignment: Trace retrolisthesis of L2 on L3. Vertebral bodies otherwise normally aligned with preservation of the normal lumbar lordosis. Vertebrae: Vertebral body heights are maintained without evidence for acute or chronic fracture. Bone marrow signal intensity within normal limits. Few small benign hemangiomas noted. No worrisome osseous lesions. Conus medullaris and cauda equina: Conus extends to the L1 level. Conus and cauda equina appear normal. Paraspinal and other soft tissues: Paraspinous soft tissues within normal limits. Visualized visceral structures are normal. Disc levels: L1-2: Mild diffuse disc bulge with disc desiccation. No significant stenosis. No impingement. L2-3: Trace retrolisthesis. Diffuse disc bulge with disc desiccation. Posterior annular fissure. Mild to moderate facet with ligamentum flavum hypertrophy. Superimposed 5 mm synovial cyst present at the anteromedial aspect of the left L2-3 facet (series 14, image 13). Resultant mild canal with mild to moderate bilateral lateral recess stenosis. Foramina remain patent. L3-4: Mild diffuse disc bulge with disc desiccation. Mild to moderate facet and ligament flavum hypertrophy. Mild canal with mild to moderate bilateral subarticular stenosis. Borderline mild L3 foraminal narrowing. L4-5: Mild diffuse disc bulge with disc desiccation. Moderate facet arthrosis with ligamentum flavum hypertrophy. Mild reactive edema about the bilateral L4-5 facets. Moderate canal with moderate to severe bilateral subarticular stenosis. Mild left L4 foraminal narrowing. L5-S1: Disc desiccation with superimposed small central disc protrusion. Protruding disc closely approximates the descending S1  nerve roots without frank neural impingement or displacement. No stenosis. Mild facet hypertrophy. IMPRESSION: MR THORACIC SPINE IMPRESSION 1. No acute abnormality within the thoracic spine. 2. Small disc protrusion/disc bulging at C3-4, C6-7, and T9-10 without stenosis. 3.  Short-segment flattening of the right posterior cord at the level of T4. Findings suggests the presence of a small subdural and/or intradural arachnoid cyst at this level, not well delineated on this exam due to motion. No associated cord signal changes or other abnormality. This finding is most likely incidental in nature, and of doubtful clinical significance. MR LUMBAR SPINE IMPRESSION 1. No acute abnormality within the lumbar spine. 2. Degenerative disc bulging with facet arthrosis at L4-5 with resultant moderate canal with moderate to severe bilateral subarticular stenosis. 3. Small central disc protrusion at L5-S1, closely approximating the descending S1 nerve roots without frank neural impingement or displacement. 4. Multifactorial degenerative changes at L2-3 and L3-4 with resultant mild canal with mild to moderate subarticular stenosis. Electronically Signed   By: Jeannine Boga M.D.   On: 08/27/2017 01:33   Mr Lumbar Spine Wo Contrast  Result Date: 08/27/2017 CLINICAL DATA:  Initial evaluation for acute back pain, shaking spells, syncope. EXAM: MRI THORACIC AND LUMBAR SPINE WITHOUT CONTRAST TECHNIQUE: Multiplanar and multiecho pulse sequences of the thoracic and lumbar spine were obtained without intravenous contrast. COMPARISON:  None. FINDINGS: MRI THORACIC SPINE FINDINGS Alignment: Vertebral bodies normally aligned with preservation of the normal thoracic kyphosis. No listhesis. Vertebrae: Vertebral body heights well maintained without evidence for acute or chronic fracture. Bone marrow signal intensity within normal limits. No discrete or worrisome osseous lesions. No abnormal marrow edema. Cord: Short-segment flattening  of the right posterior aspect of the thoracic spinal cord present at the level of T4 (series 7, image 12). Findings suggest a possible underlying lesion such is a subdural or intradural arachnoid cyst, not well delineated due to motion through this region. Similarly, an exact measurement is difficult to discern. Cord is mildly flattened at this level without cord signal changes. This is likely incidental in nature and of no clinical significance. Appearance of the thoracic spinal cord is otherwise normal. No cord signal changes or other abnormality. Paraspinal and other soft tissues: Paraspinous soft tissues within normal limits. Visualized lungs are clear. Visualized visceral structures are normal. Disc levels: T3-4: Small central disc protrusion indents the ventral thecal sac without significant stenosis. T6-7: Tiny central disc protrusion minimally indents the ventral thecal sac without stenosis. T9-10: Mild degenerative disc bulging without significant stenosis. No other significant degenerative changes within the thoracic spine. No canal or foraminal stenosis. MRI LUMBAR SPINE FINDINGS Segmentation: Normal segmentation. Lowest well-formed disc labeled the L5-S1 level. Alignment: Trace retrolisthesis of L2 on L3. Vertebral bodies otherwise normally aligned with preservation of the normal lumbar lordosis. Vertebrae: Vertebral body heights are maintained without evidence for acute or chronic fracture. Bone marrow signal intensity within normal limits. Few small benign hemangiomas noted. No worrisome osseous lesions. Conus medullaris and cauda equina: Conus extends to the L1 level. Conus and cauda equina appear normal. Paraspinal and other soft tissues: Paraspinous soft tissues within normal limits. Visualized visceral structures are normal. Disc levels: L1-2: Mild diffuse disc bulge with disc desiccation. No significant stenosis. No impingement. L2-3: Trace retrolisthesis. Diffuse disc bulge with disc desiccation.  Posterior annular fissure. Mild to moderate facet with ligamentum flavum hypertrophy. Superimposed 5 mm synovial cyst present at the anteromedial aspect of the left L2-3 facet (series 14, image 13). Resultant mild canal with mild to moderate bilateral lateral recess stenosis. Foramina remain patent. L3-4: Mild diffuse disc bulge with disc desiccation. Mild to moderate facet and ligament flavum hypertrophy. Mild canal with mild to moderate bilateral subarticular stenosis. Borderline mild L3 foraminal narrowing. L4-5: Mild  diffuse disc bulge with disc desiccation. Moderate facet arthrosis with ligamentum flavum hypertrophy. Mild reactive edema about the bilateral L4-5 facets. Moderate canal with moderate to severe bilateral subarticular stenosis. Mild left L4 foraminal narrowing. L5-S1: Disc desiccation with superimposed small central disc protrusion. Protruding disc closely approximates the descending S1 nerve roots without frank neural impingement or displacement. No stenosis. Mild facet hypertrophy. IMPRESSION: MR THORACIC SPINE IMPRESSION 1. No acute abnormality within the thoracic spine. 2. Small disc protrusion/disc bulging at C3-4, C6-7, and T9-10 without stenosis. 3. Short-segment flattening of the right posterior cord at the level of T4. Findings suggests the presence of a small subdural and/or intradural arachnoid cyst at this level, not well delineated on this exam due to motion. No associated cord signal changes or other abnormality. This finding is most likely incidental in nature, and of doubtful clinical significance. MR LUMBAR SPINE IMPRESSION 1. No acute abnormality within the lumbar spine. 2. Degenerative disc bulging with facet arthrosis at L4-5 with resultant moderate canal with moderate to severe bilateral subarticular stenosis. 3. Small central disc protrusion at L5-S1, closely approximating the descending S1 nerve roots without frank neural impingement or displacement. 4. Multifactorial  degenerative changes at L2-3 and L3-4 with resultant mild canal with mild to moderate subarticular stenosis. Electronically Signed   By: Jeannine Boga M.D.   On: 08/27/2017 01:33   Ct Abdomen Pelvis W Contrast  Result Date: 08/25/2017 CLINICAL DATA:  61 year old male presents status post multiple falls. History of Guillan-Barre. Currently complains of left lower quadrant abdominal pain. EXAM: CT ABDOMEN AND PELVIS WITH CONTRAST TECHNIQUE: Multidetector CT imaging of the abdomen and pelvis was performed using the standard protocol following bolus administration of intravenous contrast. CONTRAST:  14mL ISOVUE-300 IOPAMIDOL (ISOVUE-300) INJECTION 61% COMPARISON:  CT report from 07/23/2003 FINDINGS: Lower chest: Normal heart size without pericardial effusion. No active pulmonary disease. Hepatobiliary: No enhancing hepatic mass or biliary dilatation. Normal gallbladder without stones. Pancreas: Normal Spleen: Normal Adrenals/Urinary Tract: Normal bilateral adrenal glands. Tiny too small to further characterize hypodensity in the posterior interpolar cortex of the left kidney measuring 4 mm. Similar 3 mm hypodensity in the upper pole the right kidney. Statistically these are more likely to represent cysts. No obstructive uropathy. Unremarkable urinary bladder. Stomach/Bowel: Acute distal descending diverticulitis without bowel obstruction or abscess. The stomach, small intestine, and appendix are normal. Vascular/Lymphatic: No significant vascular findings are present. No enlarged abdominal or pelvic lymph nodes. Reproductive: Enlarged prostate with more focal hyperdense nodule impressing upon the base of the bladder measuring 11 x 9 x 14 mm. Other: Bilateral fat containing inguinal hernias. 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 No acute nor suspicious osseous lesions. Bilateral hip and sacroiliac joint osteoarthritis. Lower  lumbar degenerative facet arthropathy. IMPRESSION: 1. Acute uncomplicated distal descending diverticulitis with pericolonic inflammation. No bowel obstruction, abscess nor free air. 2. Tiny too small to characterize bilateral renal hypodensities statistically consistent tiny cysts. 3. Enlarged prostate with more focal hyperdense nodule impressing upon the base of the bladder measuring 11 x 9 x 14 mm. Electronically Signed   By: Ashley Royalty M.D.   On: 08/25/2017 19:37    ASSESSMENT AND PLAN:   Active Problems:   Weakness   Episode of shaking  1.  Weakness:   may have syncopized due to orthostasis and/or narcosis.  He did not injure himself as he was seated in the car during the episode.    hydrate with intravenous fluid.  Monitor  narcotic medications- ur drug sceen is negative surprisingly, as he is on prescription opioid meds. ( so he may not be taking it for some time.)  2.  Diverticulitis: Stable; continue Cipro and Flagyl 3.  Essential hypertension: Controlled , amlodipine. 4.  Hypothyroidism: Checked- normal TSH, continue Synthroid 5.  Diabetes mellitus type 2:    Keep on ISS. 6.  Rheumatoid arthritis: Reportedly psoriatic; manage pain with as little narcotic medications possible. 7.  Hyperlipidemia: Continue statin therapy 8.  DVT prophylaxis: Lovenox 9.  GI prophylaxis: Pantoprazole per home regimen 10. Abnormal movements- he had severe coarse jerking on 08/26/17 morning- responded to IV ativan. Have hx of GBS- called neuro consult.   All the records are reviewed and case discussed with Care Management/Social Workerr. Management plans discussed with the patient, family and they are in agreement.  CODE STATUS: full.  TOTAL TIME TAKING CARE OF THIS PATIENT: 35 minutes.     POSSIBLE D/C IN 1-2 DAYS, DEPENDING ON CLINICAL CONDITION.   Vaughan Basta M.D on 08/27/2017   Between 7am to 6pm - Pager - [phone]  After 6pm go to [email or web address] - password [email or web address]  [facility]  Office  [phone]  CC: Primary care physician; Birdie Sons, MD  Note: This dictation was prepared with Dragon dictation along with smaller phrase technology. Any transcriptional errors that result from this process are unintentional.

## 2017-08-27 NOTE — Progress Notes (Signed)
Patient's bed noted with difficulties controlling the over head light. The bed was replaced and the issue was not resolved. The patient was offered and encouraged to move to room 137, however, patient insisted on staying in current room stating, "all my family already know where I am and I don't wan to confuse them". Administrative staff made aware. Maintenance request made by the charge nurse. Call bell and bed alarm are functioning without any issues. Will continue to monitor and endorse.

## 2017-08-27 NOTE — Plan of Care (Signed)
  Progressing Education: Knowledge of General Education information will improve 08/27/2017 1357 - Progressing by Milderd Meager, RN Health Behavior/Discharge Planning: Ability to manage health-related needs will improve 08/27/2017 1357 - Progressing by Milderd Meager, RN Clinical Measurements: Ability to maintain clinical measurements within normal limits will improve 08/27/2017 1357 - Progressing by Milderd Meager, RN Will remain free from infection 08/27/2017 1357 - Progressing by Milderd Meager, RN Diagnostic test results will improve 08/27/2017 1357 - Progressing by Milderd Meager, RN Respiratory complications will improve 08/27/2017 1357 - Progressing by Milderd Meager, RN Cardiovascular complication will be avoided 08/27/2017 1357 - Progressing by Milderd Meager, RN Activity: Risk for activity intolerance will decrease 08/27/2017 1357 - Progressing by Milderd Meager, RN Nutrition: Adequate nutrition will be maintained 08/27/2017 1357 - Progressing by Milderd Meager, RN Coping: Level of anxiety will decrease 08/27/2017 1357 - Progressing by Milderd Meager, RN Elimination: Will not experience complications related to bowel motility 08/27/2017 1357 - Progressing by Milderd Meager, RN Will not experience complications related to urinary retention 08/27/2017 1357 - Progressing by Milderd Meager, RN Pain Managment: General experience of comfort will improve 08/27/2017 1357 - Progressing by Milderd Meager, RN Safety: Ability to remain free from injury will improve 08/27/2017 1357 - Progressing by Milderd Meager, RN Skin Integrity: Risk for impaired skin integrity will decrease 08/27/2017 1357 - Progressing by Milderd Meager, RN

## 2017-08-27 NOTE — Progress Notes (Signed)
[facility] NAME: Keith Carpenter    MR#:  [id number]  DATE OF BIRTH:  1956-02-20  SUBJECTIVE:  CHIEF COMPLAINT:   Chief Complaint  Patient presents with  . Loss of Consciousness    Multiple different complains include numbness on left side of body then abd pain then syncopal episode- which led multiple ER presentations in last 3-4 days. Finally admitted. Note some diverticulitis. Had some vigorous jerky movements in morning 08/26/17- responded to IV ativan small dose.   today some drowsy, also c/o some nose bleed ( which was just 1- 2 drops on the tissue paper he was keeping packed on his nose. Have c/o right axillary pain.   REVIEW OF SYSTEMS:  CONSTITUTIONAL: No fever,positive for fatigue or weakness.  EYES: No blurred or double vision.  EARS, NOSE, AND THROAT: No tinnitus or ear pain.  RESPIRATORY: No cough, shortness of breath, wheezing or hemoptysis.  CARDIOVASCULAR: No chest pain, orthopnea, edema.  GASTROINTESTINAL: No nausea, vomiting, diarrhea , have abdominal pain.  GENITOURINARY: No dysuria, hematuria.  ENDOCRINE: No polyuria, nocturia,  HEMATOLOGY: No anemia, easy bruising or bleeding SKIN: No rash or lesion. MUSCULOSKELETAL: No joint pain or arthritis.   NEUROLOGIC: No tingling, numbness, weakness.  PSYCHIATRY: No anxiety or depression.   ROS  DRUG ALLERGIES:   Allergies  Allergen Reactions  . Influenza Vaccines Other (See Comments)    Guillain Barre  . Declomycin [Demeclocycline]   . Demeclocycline Hcl   . Ginger   . Glipizide     Nausea   . Metformin And Related Nausea Only    Metformin IR 500 BID  . Other     Other reaction(s): Other (See Comments) Uncoded Allergy. Allergen: Other Allergy: See Patient Chart for Details, Other Reaction: Other reaction  . Remicade [Infliximab]   . Suvorexant Other (See Comments)    vision changes  . Clarithromycin Rash  . Pioglitazone Itching, Swelling and Rash     VITALS:  Blood pressure 136/70, pulse 88, temperature 98.5 F (36.9 C), temperature source Oral, resp. rate 20, weight 95.7 kg (211 lb), SpO2 96 %.  PHYSICAL EXAMINATION:  GENERAL:  61 y.o.-year-old patient lying in the bed with no acute distress.  EYES: Pupils equal, round, reactive to light and accommodation. No scleral icterus. Extraocular muscles intact.  HEENT: Head atraumatic, normocephalic. Oropharynx and nasopharynx clear.  NECK:  Supple, no jugular venous distention. No thyroid enlargement, no tenderness.  LUNGS: Normal breath sounds bilaterally, no wheezing, rales,rhonchi or crepitation. No use of accessory muscles of respiration.  CARDIOVASCULAR: S1, S2 normal. No murmurs, rubs, or gallops.  ABDOMEN: Soft, tender, nondistended. Bowel sounds present. No organomegaly or mass.  EXTREMITIES: No pedal edema, cyanosis, or clubbing.  NEUROLOGIC: Cranial nerves II through XII are intact. Muscle strength 4/5 in all extremities. Sensation intact. Gait not checked.  PSYCHIATRIC: The patient is alert and oriented x 3.  SKIN: No obvious rash, lesion, or ulcer.  Right axilla have folliculitis at 4-5 places with redness and tender localized swellings.  Physical Exam LABORATORY PANEL:   CBC Recent Labs  Lab 08/25/17 1650  WBC 11.0*  HGB 14.7  HCT 42.3  PLT 288   ------------------------------------------------------------------------------------------------------------------  Chemistries  Recent Labs  Lab 08/25/17 1650  NA 137  K 4.1  CL 101  CO2 24  GLUCOSE 137*  BUN 11  CREATININE 0.70  CALCIUM 8.9  AST 29  ALT 24  ALKPHOS 74  BILITOT 0.7   ------------------------------------------------------------------------------------------------------------------  Cardiac Enzymes Recent Labs  Lab 08/24/17 1033  TROPONINI <0.03   ------------------------------------------------------------------------------------------------------------------  RADIOLOGY:  Dg Chest  2 View  Result Date: 08/26/2017 CLINICAL DATA:  Pneumonia. EXAM: CHEST  2 VIEW COMPARISON:  08/24/2017. FINDINGS: Mediastinum and hilar structures normal. Borderline cardiomegaly. Normal pulmonary vascularity. Low lung volumes with mild basilar atelectasis. No pleural effusion or pneumothorax. Degenerative changes thoracic spine. IMPRESSION: 1.  Borderline cardiomegaly.  No pulmonary venous congestion. 2.  Low lung volumes with mild basilar atelectasis. Electronically Signed   By: Marcello Moores  Register   On: 08/26/2017 09:17   Mr Thoracic Spine Wo Contrast  Result Date: 08/27/2017 CLINICAL DATA:  Initial evaluation for acute back pain, shaking spells, syncope. EXAM: MRI THORACIC AND LUMBAR SPINE WITHOUT CONTRAST TECHNIQUE: Multiplanar and multiecho pulse sequences of the thoracic and lumbar spine were obtained without intravenous contrast. COMPARISON:  None. FINDINGS: MRI THORACIC SPINE FINDINGS Alignment: Vertebral bodies normally aligned with preservation of the normal thoracic kyphosis. No listhesis. Vertebrae: Vertebral body heights well maintained without evidence for acute or chronic fracture. Bone marrow signal intensity within normal limits. No discrete or worrisome osseous lesions. No abnormal marrow edema. Cord: Short-segment flattening of the right posterior aspect of the thoracic spinal cord present at the level of T4 (series 7, image 12). Findings suggest a possible underlying lesion such is a subdural or intradural arachnoid cyst, not well delineated due to motion through this region. Similarly, an exact measurement is difficult to discern. Cord is mildly flattened at this level without cord signal changes. This is likely incidental in nature and of no clinical significance. Appearance of the thoracic spinal cord is otherwise normal. No cord signal changes or other abnormality. Paraspinal and other soft tissues: Paraspinous soft tissues within normal limits. Visualized lungs are clear. Visualized  visceral structures are normal. Disc levels: T3-4: Small central disc protrusion indents the ventral thecal sac without significant stenosis. T6-7: Tiny central disc protrusion minimally indents the ventral thecal sac without stenosis. T9-10: Mild degenerative disc bulging without significant stenosis. No other significant degenerative changes within the thoracic spine. No canal or foraminal stenosis. MRI LUMBAR SPINE FINDINGS Segmentation: Normal segmentation. Lowest well-formed disc labeled the L5-S1 level. Alignment: Trace retrolisthesis of L2 on L3. Vertebral bodies otherwise normally aligned with preservation of the normal lumbar lordosis. Vertebrae: Vertebral body heights are maintained without evidence for acute or chronic fracture. Bone marrow signal intensity within normal limits. Few small benign hemangiomas noted. No worrisome osseous lesions. Conus medullaris and cauda equina: Conus extends to the L1 level. Conus and cauda equina appear normal. Paraspinal and other soft tissues: Paraspinous soft tissues within normal limits. Visualized visceral structures are normal. Disc levels: L1-2: Mild diffuse disc bulge with disc desiccation. No significant stenosis. No impingement. L2-3: Trace retrolisthesis. Diffuse disc bulge with disc desiccation. Posterior annular fissure. Mild to moderate facet with ligamentum flavum hypertrophy. Superimposed 5 mm synovial cyst present at the anteromedial aspect of the left L2-3 facet (series 14, image 13). Resultant mild canal with mild to moderate bilateral lateral recess stenosis. Foramina remain patent. L3-4: Mild diffuse disc bulge with disc desiccation. Mild to moderate facet and ligament flavum hypertrophy. Mild canal with mild to moderate bilateral subarticular stenosis. Borderline mild L3 foraminal narrowing. L4-5: Mild diffuse disc bulge with disc desiccation. Moderate facet arthrosis with ligamentum flavum hypertrophy. Mild reactive edema about the bilateral L4-5  facets. Moderate canal with moderate to severe bilateral subarticular stenosis. Mild left L4 foraminal narrowing. L5-S1: Disc desiccation with superimposed small central disc protrusion.  Protruding disc closely approximates the descending S1 nerve roots without frank neural impingement or displacement. No stenosis. Mild facet hypertrophy. IMPRESSION: MR THORACIC SPINE IMPRESSION 1. No acute abnormality within the thoracic spine. 2. Small disc protrusion/disc bulging at C3-4, C6-7, and T9-10 without stenosis. 3. Short-segment flattening of the right posterior cord at the level of T4. Findings suggests the presence of a small subdural and/or intradural arachnoid cyst at this level, not well delineated on this exam due to motion. No associated cord signal changes or other abnormality. This finding is most likely incidental in nature, and of doubtful clinical significance. MR LUMBAR SPINE IMPRESSION 1. No acute abnormality within the lumbar spine. 2. Degenerative disc bulging with facet arthrosis at L4-5 with resultant moderate canal with moderate to severe bilateral subarticular stenosis. 3. Small central disc protrusion at L5-S1, closely approximating the descending S1 nerve roots without frank neural impingement or displacement. 4. Multifactorial degenerative changes at L2-3 and L3-4 with resultant mild canal with mild to moderate subarticular stenosis. Electronically Signed   By: Jeannine Boga M.D.   On: 08/27/2017 01:33   Mr Lumbar Spine Wo Contrast  Result Date: 08/27/2017 CLINICAL DATA:  Initial evaluation for acute back pain, shaking spells, syncope. EXAM: MRI THORACIC AND LUMBAR SPINE WITHOUT CONTRAST TECHNIQUE: Multiplanar and multiecho pulse sequences of the thoracic and lumbar spine were obtained without intravenous contrast. COMPARISON:  None. FINDINGS: MRI THORACIC SPINE FINDINGS Alignment: Vertebral bodies normally aligned with preservation of the normal thoracic kyphosis. No listhesis.  Vertebrae: Vertebral body heights well maintained without evidence for acute or chronic fracture. Bone marrow signal intensity within normal limits. No discrete or worrisome osseous lesions. No abnormal marrow edema. Cord: Short-segment flattening of the right posterior aspect of the thoracic spinal cord present at the level of T4 (series 7, image 12). Findings suggest a possible underlying lesion such is a subdural or intradural arachnoid cyst, not well delineated due to motion through this region. Similarly, an exact measurement is difficult to discern. Cord is mildly flattened at this level without cord signal changes. This is likely incidental in nature and of no clinical significance. Appearance of the thoracic spinal cord is otherwise normal. No cord signal changes or other abnormality. Paraspinal and other soft tissues: Paraspinous soft tissues within normal limits. Visualized lungs are clear. Visualized visceral structures are normal. Disc levels: T3-4: Small central disc protrusion indents the ventral thecal sac without significant stenosis. T6-7: Tiny central disc protrusion minimally indents the ventral thecal sac without stenosis. T9-10: Mild degenerative disc bulging without significant stenosis. No other significant degenerative changes within the thoracic spine. No canal or foraminal stenosis. MRI LUMBAR SPINE FINDINGS Segmentation: Normal segmentation. Lowest well-formed disc labeled the L5-S1 level. Alignment: Trace retrolisthesis of L2 on L3. Vertebral bodies otherwise normally aligned with preservation of the normal lumbar lordosis. Vertebrae: Vertebral body heights are maintained without evidence for acute or chronic fracture. Bone marrow signal intensity within normal limits. Few small benign hemangiomas noted. No worrisome osseous lesions. Conus medullaris and cauda equina: Conus extends to the L1 level. Conus and cauda equina appear normal. Paraspinal and other soft tissues: Paraspinous soft  tissues within normal limits. Visualized visceral structures are normal. Disc levels: L1-2: Mild diffuse disc bulge with disc desiccation. No significant stenosis. No impingement. L2-3: Trace retrolisthesis. Diffuse disc bulge with disc desiccation. Posterior annular fissure. Mild to moderate facet with ligamentum flavum hypertrophy. Superimposed 5 mm synovial cyst present at the anteromedial aspect of the left L2-3 facet (series 14, image 13).  Resultant mild canal with mild to moderate bilateral lateral recess stenosis. Foramina remain patent. L3-4: Mild diffuse disc bulge with disc desiccation. Mild to moderate facet and ligament flavum hypertrophy. Mild canal with mild to moderate bilateral subarticular stenosis. Borderline mild L3 foraminal narrowing. L4-5: Mild diffuse disc bulge with disc desiccation. Moderate facet arthrosis with ligamentum flavum hypertrophy. Mild reactive edema about the bilateral L4-5 facets. Moderate canal with moderate to severe bilateral subarticular stenosis. Mild left L4 foraminal narrowing. L5-S1: Disc desiccation with superimposed small central disc protrusion. Protruding disc closely approximates the descending S1 nerve roots without frank neural impingement or displacement. No stenosis. Mild facet hypertrophy. IMPRESSION: MR THORACIC SPINE IMPRESSION 1. No acute abnormality within the thoracic spine. 2. Small disc protrusion/disc bulging at C3-4, C6-7, and T9-10 without stenosis. 3. Short-segment flattening of the right posterior cord at the level of T4. Findings suggests the presence of a small subdural and/or intradural arachnoid cyst at this level, not well delineated on this exam due to motion. No associated cord signal changes or other abnormality. This finding is most likely incidental in nature, and of doubtful clinical significance. MR LUMBAR SPINE IMPRESSION 1. No acute abnormality within the lumbar spine. 2. Degenerative disc bulging with facet arthrosis at L4-5 with  resultant moderate canal with moderate to severe bilateral subarticular stenosis. 3. Small central disc protrusion at L5-S1, closely approximating the descending S1 nerve roots without frank neural impingement or displacement. 4. Multifactorial degenerative changes at L2-3 and L3-4 with resultant mild canal with mild to moderate subarticular stenosis. Electronically Signed   By: Jeannine Boga M.D.   On: 08/27/2017 01:33   Ct Abdomen Pelvis W Contrast  Result Date: 08/25/2017 CLINICAL DATA:  61 year old male presents status post multiple falls. History of Guillan-Barre. Currently complains of left lower quadrant abdominal pain. EXAM: CT ABDOMEN AND PELVIS WITH CONTRAST TECHNIQUE: Multidetector CT imaging of the abdomen and pelvis was performed using the standard protocol following bolus administration of intravenous contrast. CONTRAST:  131mL ISOVUE-300 IOPAMIDOL (ISOVUE-300) INJECTION 61% COMPARISON:  CT report from 07/23/2003 FINDINGS: Lower chest: Normal heart size without pericardial effusion. No active pulmonary disease. Hepatobiliary: No enhancing hepatic mass or biliary dilatation. Normal gallbladder without stones. Pancreas: Normal Spleen: Normal Adrenals/Urinary Tract: Normal bilateral adrenal glands. Tiny too small to further characterize hypodensity in the posterior interpolar cortex of the left kidney measuring 4 mm. Similar 3 mm hypodensity in the upper pole the right kidney. Statistically these are more likely to represent cysts. No obstructive uropathy. Unremarkable urinary bladder. Stomach/Bowel: Acute distal descending diverticulitis without bowel obstruction or abscess. The stomach, small intestine, and appendix are normal. Vascular/Lymphatic: No significant vascular findings are present. No enlarged abdominal or pelvic lymph nodes. Reproductive: Enlarged prostate with more focal hyperdense nodule impressing upon the base of the bladder measuring 11 x 9 x 14 mm. Other: Bilateral fat  containing inguinal hernias. 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 No acute nor suspicious osseous lesions. Bilateral hip and sacroiliac joint osteoarthritis. Lower lumbar degenerative facet arthropathy. IMPRESSION: 1. Acute uncomplicated distal descending diverticulitis with pericolonic inflammation. No bowel obstruction, abscess nor free air. 2. Tiny too small to characterize bilateral renal hypodensities statistically consistent tiny cysts. 3. Enlarged prostate with more focal hyperdense nodule impressing upon the base of the bladder measuring 11 x 9 x 14 mm. Electronically Signed   By: Ashley Royalty M.D.   On: 08/25/2017 19:37    ASSESSMENT AND PLAN:   Active Problems:  Weakness   Episode of shaking  1.  Weakness:   may have syncopized due to orthostasis and/or narcosis.  He did not injure himself as he was seated in the car during the episode.    hydrate with intravenous fluid.  Monitor narcotic medications- ur drug sceen is negative surprisingly, as he is on prescription opioid meds. ( so he may not be taking it for some time.) PT eval.  neuro suggest to decrease meds.  2.  Diverticulitis: Stable; continue Cipro and Flagyl 3.  Essential hypertension: Controlled , amlodipine. 4.  Hypothyroidism: Checked- normal TSH, continue Synthroid 5.  Diabetes mellitus type 2:    Keep on ISS. 6.  Rheumatoid arthritis: Reportedly psoriatic; manage pain with as little narcotic medications possible. 7.  Hyperlipidemia: Continue statin therapy 8.  DVT prophylaxis: Lovenox 9.  GI prophylaxis: Pantoprazole per home regimen 10. Abnormal movements- he had severe coarse jerking on 08/26/17 morning- responded to IV ativan. Have hx of GBS- called neuro consult. 11. Folliculitis- Augmentin.  All the records are reviewed and case discussed with Care Management/Social Workerr. Management plans discussed with the patient, family  and they are in agreement.  CODE STATUS: full.  TOTAL TIME TAKING CARE OF THIS PATIENT: 35 minutes.   POSSIBLE D/C IN 1-2 DAYS, DEPENDING ON CLINICAL CONDITION.   Vaughan Basta M.D on 08/27/2017   Between 7am to 6pm - Pager - [phone]  After 6pm go to [email or web address] - password [email or web address] [facility]  Office  [phone]  CC: Primary care physician; Birdie Sons, MD  Note: This dictation was prepared with Dragon dictation along with smaller phrase technology. Any transcriptional errors that result from this process are unintentional.

## 2017-08-27 NOTE — Progress Notes (Signed)
Pt complaining of nerve pain. Pt stating that he takes 600mg  of gabapenin 3 times a day. Dr. Bridgett Larsson said it is ok to order.

## 2017-08-28 DIAGNOSIS — R251 Tremor, unspecified: Secondary | ICD-10-CM | POA: Diagnosis not present

## 2017-08-28 LAB — CREATININE, SERUM
CREATININE: 0.85 mg/dL (ref 0.61–1.24)
GFR calc Af Amer: >60 mL/min (ref >60)
GFR calc non Af Amer: >60 mL/min (ref >60)

## 2017-08-28 LAB — GLUCOSE, CAPILLARY
Glucose-Capillary: 104 mg/dL — ABNORMAL HIGH (ref 65–99)
Glucose-Capillary: 130 mg/dL — ABNORMAL HIGH (ref 65–99)

## 2017-08-28 LAB — CBC
HCT: 36.4 % — ABNORMAL LOW (ref 40.0–52.0)
HEMOGLOBIN: 12.9 g/dL — AB (ref 13.0–18.0)
MCH: 30.3 pg (ref 26.0–34.0)
MCHC: 35.5 g/dL (ref 32.0–36.0)
MCV: 85.2 fL (ref 80.0–100.0)
PLATELETS: 258 10*3/uL (ref 150–440)
RBC: 4.27 MIL/uL — ABNORMAL LOW (ref 4.40–5.90)
RDW: 13 % (ref 11.5–14.5)
WBC: 8 10*3/uL (ref 3.8–10.6)

## 2017-08-28 MED ORDER — AMOXICILLIN-POT CLAVULANATE 875-125 MG PO TABS: 1.0000 | ORAL_TABLET | Freq: Two times a day (BID) | ORAL | 0 refills | Status: AC

## 2017-08-28 MED ORDER — GABAPENTIN 300 MG PO CAPS: 600.0000 mg | ORAL_CAPSULE | Freq: Three times a day (TID) | ORAL | 0 refills | Status: DC

## 2017-08-28 NOTE — Progress Notes (Signed)
Subjective: Patient up and out of bed today, sitting in a char. Reports that since the start of his Neurontin he feels back to baseline.    Objective: Current vital signs: BP 138/74 (BP Location: Right Arm)   Pulse 78   Temp 98.7 F (37.1 C) (Oral)   Resp 18   Wt 98.6 kg (217 lb 6.4 oz)   SpO2 98%   BMI 30.32 kg/m  Vital signs in last 24 hours: Temp:  [98.3 F (36.8 C)-98.9 F (37.2 C)] 98.7 F (37.1 C) (12/15 1000) Pulse Rate:  [66-88] 78 (12/15 1000) Resp:  [18-20] 18 (12/15 1000) BP: (119-142)/(58-77) 138/74 (12/15 1000) SpO2:  [95 %-98 %] 98 % (12/15 1000) Weight:  [98.6 kg (217 lb 6.4 oz)] 98.6 kg (217 lb 6.4 oz) (12/15 0500)  Intake/Output from previous day: 12/14 0701 - 12/15 0700 In: 120 [P.O.:120] Out: 200 [Urine:200] Intake/Output this shift: Total I/O In: 240 [P.O.:240] Out: 500 [Urine:500] Nutritional status: Diet Carb Modified Fluid consistency: Thin; Room service appropriate? Yes Diet - low sodium heart healthy  Neurologic Exam: Mental Status: Alert, oriented, thought content appropriate.  Speech fluent without evidence of aphasia.  Able to follow 3 step commands without difficulty. Cranial Nerves: II: Discs flat bilaterally; Visual fields grossly normal, pupils equal, round, reactive to light and accommodation III,IV, VI: ptosis not present, extra-ocular motions intact bilaterally V,VII: smile symmetric, facial light touch sensation normal bilaterally VIII: hearing normal bilaterally IX,X: gag reflex present XI: bilateral shoulder shrug XII: midline tongue extension Motor: Right :  Upper extremity   5/5                                      Left:     Upper extremity   5/5             Lower extremity   5/5                                                  Lower extremity   5/5 No leg jerking noted Sensory: Pinprick and light touch intact throughout, bilaterally.    Lab Results: Basic Metabolic Panel: Recent Labs  Lab 08/24/17 1033 08/25/17 1650  08/28/17 0428  NA 135 137  --   K 3.9 4.1  --   CL 99* 101  --   CO2 23 24  --   GLUCOSE 147* 137*  --   BUN 20 11  --   CREATININE 0.83 0.70 0.85  CALCIUM 8.8* 8.9  --     Liver Function Tests: Recent Labs  Lab 08/24/17 1033 08/25/17 1650  AST 26 29  ALT 25 24  ALKPHOS 80 74  BILITOT 0.5 0.7  PROT 7.9 8.5*  ALBUMIN 4.0 4.1   Recent Labs  Lab 08/24/17 1033 08/25/17 1650  LIPASE 38 43   No results for input(s): AMMONIA in the last 168 hours.  CBC: Recent Labs  Lab 08/24/17 1033 08/25/17 1650 08/28/17 0428  WBC 8.6 11.0* 8.0  NEUTROABS 6.7*  --   --   HGB 14.8 14.7 12.9*  HCT 42.5 42.3 36.4*  MCV 84.6 85.5 85.2  PLT 266 288 258    Cardiac Enzymes: Recent Labs  Lab 08/24/17 1033  TROPONINI <0.03    Lipid  Panel: No results for input(s): CHOL, TRIG, HDL, CHOLHDL, VLDL, LDLCALC in the last 168 hours.  CBG: Recent Labs  Lab 08/26/17 0808 08/27/17 1635 08/27/17 2130 08/28/17 0736 08/28/17 1125  GLUCAP 132* 149* 110* 104* 130*    Microbiology: Results for orders placed or performed during the hospital encounter of 08/21/16  Chlamydia/NGC rt PCR ([facility] only)     Status: None   Collection Time: 08/21/16  9:24 AM  Result Value Ref Range Status   Specimen source GC/Chlam URINE, RANDOM  Final   Chlamydia Tr NOT DETECTED NOT DETECTED Final   N gonorrhoeae NOT DETECTED NOT DETECTED Final    Comment: (NOTE) 100  This methodology has not been evaluated in pregnant women or in 200  patients with a history of hysterectomy. 300 400  This methodology will not be performed on patients less than 56  years of age.     Coagulation Studies: No results for input(s): LABPROT, INR in the last 72 hours.  Imaging: Mr Thoracic Spine Wo Contrast  Result Date: 08/27/2017 CLINICAL DATA:  Initial evaluation for acute back pain, shaking spells, syncope. EXAM: MRI THORACIC AND LUMBAR SPINE WITHOUT CONTRAST TECHNIQUE: Multiplanar and multiecho pulse sequences of the  thoracic and lumbar spine were obtained without intravenous contrast. COMPARISON:  None. FINDINGS: MRI THORACIC SPINE FINDINGS Alignment: Vertebral bodies normally aligned with preservation of the normal thoracic kyphosis. No listhesis. Vertebrae: Vertebral body heights well maintained without evidence for acute or chronic fracture. Bone marrow signal intensity within normal limits. No discrete or worrisome osseous lesions. No abnormal marrow edema. Cord: Short-segment flattening of the right posterior aspect of the thoracic spinal cord present at the level of T4 (series 7, image 12). Findings suggest a possible underlying lesion such is a subdural or intradural arachnoid cyst, not well delineated due to motion through this region. Similarly, an exact measurement is difficult to discern. Cord is mildly flattened at this level without cord signal changes. This is likely incidental in nature and of no clinical significance. Appearance of the thoracic spinal cord is otherwise normal. No cord signal changes or other abnormality. Paraspinal and other soft tissues: Paraspinous soft tissues within normal limits. Visualized lungs are clear. Visualized visceral structures are normal. Disc levels: T3-4: Small central disc protrusion indents the ventral thecal sac without significant stenosis. T6-7: Tiny central disc protrusion minimally indents the ventral thecal sac without stenosis. T9-10: Mild degenerative disc bulging without significant stenosis. No other significant degenerative changes within the thoracic spine. No canal or foraminal stenosis. MRI LUMBAR SPINE FINDINGS Segmentation: Normal segmentation. Lowest well-formed disc labeled the L5-S1 level. Alignment: Trace retrolisthesis of L2 on L3. Vertebral bodies otherwise normally aligned with preservation of the normal lumbar lordosis. Vertebrae: Vertebral body heights are maintained without evidence for acute or chronic fracture. Bone marrow signal intensity within  normal limits. Few small benign hemangiomas noted. No worrisome osseous lesions. Conus medullaris and cauda equina: Conus extends to the L1 level. Conus and cauda equina appear normal. Paraspinal and other soft tissues: Paraspinous soft tissues within normal limits. Visualized visceral structures are normal. Disc levels: L1-2: Mild diffuse disc bulge with disc desiccation. No significant stenosis. No impingement. L2-3: Trace retrolisthesis. Diffuse disc bulge with disc desiccation. Posterior annular fissure. Mild to moderate facet with ligamentum flavum hypertrophy. Superimposed 5 mm synovial cyst present at the anteromedial aspect of the left L2-3 facet (series 14, image 13). Resultant mild canal with mild to moderate bilateral lateral recess stenosis. Foramina remain patent. L3-4: Mild diffuse disc  bulge with disc desiccation. Mild to moderate facet and ligament flavum hypertrophy. Mild canal with mild to moderate bilateral subarticular stenosis. Borderline mild L3 foraminal narrowing. L4-5: Mild diffuse disc bulge with disc desiccation. Moderate facet arthrosis with ligamentum flavum hypertrophy. Mild reactive edema about the bilateral L4-5 facets. Moderate canal with moderate to severe bilateral subarticular stenosis. Mild left L4 foraminal narrowing. L5-S1: Disc desiccation with superimposed small central disc protrusion. Protruding disc closely approximates the descending S1 nerve roots without frank neural impingement or displacement. No stenosis. Mild facet hypertrophy. IMPRESSION: MR THORACIC SPINE IMPRESSION 1. No acute abnormality within the thoracic spine. 2. Small disc protrusion/disc bulging at C3-4, C6-7, and T9-10 without stenosis. 3. Short-segment flattening of the right posterior cord at the level of T4. Findings suggests the presence of a small subdural and/or intradural arachnoid cyst at this level, not well delineated on this exam due to motion. No associated cord signal changes or other  abnormality. This finding is most likely incidental in nature, and of doubtful clinical significance. MR LUMBAR SPINE IMPRESSION 1. No acute abnormality within the lumbar spine. 2. Degenerative disc bulging with facet arthrosis at L4-5 with resultant moderate canal with moderate to severe bilateral subarticular stenosis. 3. Small central disc protrusion at L5-S1, closely approximating the descending S1 nerve roots without frank neural impingement or displacement. 4. Multifactorial degenerative changes at L2-3 and L3-4 with resultant mild canal with mild to moderate subarticular stenosis. Electronically Signed   By: Jeannine Boga M.D.   On: 08/27/2017 01:33   Mr Lumbar Spine Wo Contrast  Result Date: 08/27/2017 CLINICAL DATA:  Initial evaluation for acute back pain, shaking spells, syncope. EXAM: MRI THORACIC AND LUMBAR SPINE WITHOUT CONTRAST TECHNIQUE: Multiplanar and multiecho pulse sequences of the thoracic and lumbar spine were obtained without intravenous contrast. COMPARISON:  None. FINDINGS: MRI THORACIC SPINE FINDINGS Alignment: Vertebral bodies normally aligned with preservation of the normal thoracic kyphosis. No listhesis. Vertebrae: Vertebral body heights well maintained without evidence for acute or chronic fracture. Bone marrow signal intensity within normal limits. No discrete or worrisome osseous lesions. No abnormal marrow edema. Cord: Short-segment flattening of the right posterior aspect of the thoracic spinal cord present at the level of T4 (series 7, image 12). Findings suggest a possible underlying lesion such is a subdural or intradural arachnoid cyst, not well delineated due to motion through this region. Similarly, an exact measurement is difficult to discern. Cord is mildly flattened at this level without cord signal changes. This is likely incidental in nature and of no clinical significance. Appearance of the thoracic spinal cord is otherwise normal. No cord signal changes or  other abnormality. Paraspinal and other soft tissues: Paraspinous soft tissues within normal limits. Visualized lungs are clear. Visualized visceral structures are normal. Disc levels: T3-4: Small central disc protrusion indents the ventral thecal sac without significant stenosis. T6-7: Tiny central disc protrusion minimally indents the ventral thecal sac without stenosis. T9-10: Mild degenerative disc bulging without significant stenosis. No other significant degenerative changes within the thoracic spine. No canal or foraminal stenosis. MRI LUMBAR SPINE FINDINGS Segmentation: Normal segmentation. Lowest well-formed disc labeled the L5-S1 level. Alignment: Trace retrolisthesis of L2 on L3. Vertebral bodies otherwise normally aligned with preservation of the normal lumbar lordosis. Vertebrae: Vertebral body heights are maintained without evidence for acute or chronic fracture. Bone marrow signal intensity within normal limits. Few small benign hemangiomas noted. No worrisome osseous lesions. Conus medullaris and cauda equina: Conus extends to the L1 level. Conus and cauda equina  appear normal. Paraspinal and other soft tissues: Paraspinous soft tissues within normal limits. Visualized visceral structures are normal. Disc levels: L1-2: Mild diffuse disc bulge with disc desiccation. No significant stenosis. No impingement. L2-3: Trace retrolisthesis. Diffuse disc bulge with disc desiccation. Posterior annular fissure. Mild to moderate facet with ligamentum flavum hypertrophy. Superimposed 5 mm synovial cyst present at the anteromedial aspect of the left L2-3 facet (series 14, image 13). Resultant mild canal with mild to moderate bilateral lateral recess stenosis. Foramina remain patent. L3-4: Mild diffuse disc bulge with disc desiccation. Mild to moderate facet and ligament flavum hypertrophy. Mild canal with mild to moderate bilateral subarticular stenosis. Borderline mild L3 foraminal narrowing. L4-5: Mild diffuse  disc bulge with disc desiccation. Moderate facet arthrosis with ligamentum flavum hypertrophy. Mild reactive edema about the bilateral L4-5 facets. Moderate canal with moderate to severe bilateral subarticular stenosis. Mild left L4 foraminal narrowing. L5-S1: Disc desiccation with superimposed small central disc protrusion. Protruding disc closely approximates the descending S1 nerve roots without frank neural impingement or displacement. No stenosis. Mild facet hypertrophy. IMPRESSION: MR THORACIC SPINE IMPRESSION 1. No acute abnormality within the thoracic spine. 2. Small disc protrusion/disc bulging at C3-4, C6-7, and T9-10 without stenosis. 3. Short-segment flattening of the right posterior cord at the level of T4. Findings suggests the presence of a small subdural and/or intradural arachnoid cyst at this level, not well delineated on this exam due to motion. No associated cord signal changes or other abnormality. This finding is most likely incidental in nature, and of doubtful clinical significance. MR LUMBAR SPINE IMPRESSION 1. No acute abnormality within the lumbar spine. 2. Degenerative disc bulging with facet arthrosis at L4-5 with resultant moderate canal with moderate to severe bilateral subarticular stenosis. 3. Small central disc protrusion at L5-S1, closely approximating the descending S1 nerve roots without frank neural impingement or displacement. 4. Multifactorial degenerative changes at L2-3 and L3-4 with resultant mild canal with mild to moderate subarticular stenosis. Electronically Signed   By: Jeannine Boga M.D.   On: 08/27/2017 01:33    Medications:  I have reviewed the patient's current medications. Scheduled: . amLODipine  5 mg Oral Daily  . amoxicillin-clavulanate  1 tablet Oral Q12H  . atorvastatin  10 mg Oral QPM  . ciprofloxacin  500 mg Oral BID  . docusate sodium  100 mg Oral BID  . DULoxetine  30 mg Oral Daily  . enoxaparin (LOVENOX) injection  40 mg Subcutaneous  Q24H  . famotidine  20 mg Oral BID  . fentaNYL  50 mcg Transdermal Q48H  . gabapentin  600 mg Oral TID  . insulin aspart  0-9 Units Subcutaneous TID WC  . levothyroxine  137 mcg Oral QAC breakfast  . metoprolol tartrate  50 mg Oral BID  . metroNIDAZOLE  500 mg Oral TID  . pantoprazole  40 mg Oral Daily    Assessment/Plan: Patient back to baseline with the addition of the Neurontin.    No further neurologic intervention is recommended at this time.  If further questions arise, please call or page at that time.  Thank you for allowing neurology to participate in the care of this patient.    LOS: 0 days   Alexis Goodell, MD Neurology [phone] 08/28/2017  11:52 AM

## 2017-08-28 NOTE — Discharge Summary (Signed)
[facility] [place] NAME: Keith Carpenter    MR#:  [id number]  DATE OF BIRTH:  06-05-1956  DATE OF ADMISSION:  08/26/2017 ADMITTING PHYSICIAN: Harrie Foreman, MD  DATE OF DISCHARGE: 08/28/2017  PRIMARY CARE PHYSICIAN: Birdie Sons, MD    ADMISSION DIAGNOSIS:  loss of consciousness  DISCHARGE DIAGNOSIS:  Active Problems:   Weakness   Episode of shaking   SECONDARY DIAGNOSIS:   Past Medical History:  Diagnosis Date  . Collagen vascular disease (HCC)    RA  . Diabetes mellitus without complication ([place])   . Gastric ulcer   . H/O Guillain-Barre syndrome   . Helicobacter pylori gastritis treated 07/2017  . Hypertension   . Hypocalcemia   . Panic disorder   . Schamberg disease   . Thoracic aortic aneurysm (HCC)    4.4 cm followed at [facility]  . Thyroid cancer [facility])     HOSPITAL COURSE:   61 year old male with past medical history of Guillain-Barr syndrome, diabetes, collagen vascular disease, history of gastric ulcer, history of thoracic aortic aneurysm, anxiety/panic disorder, essential hypertension, history of H. pylori gastritis who presented to the hospital due to multiple complaints including weakness, tremor and shaking of his left side of his body, along with abdominal pain and suspected syncope.  1. Weakness, syncope-etiology unclear suspected to be polypharmacy and may be orthostasis. His orthostatic vital signs although were negative. Patient was hydrated with IV fluids. Patient is on fentanyl patch at home. -Patient was seen by neurology and his neurologic workup was essentially negative for stroke. Patient underwent MRI/MRA of his brain and also MRI of his thoracic and lumbar spine which were all essentially normal. -Patient was given some Neurontin for his neuropathic pain and tremor of his lower extremity. Symptoms have since improved he's had no further episodes of syncope, no alarms on telemetry. He is clinically  stable and therefore being discharged home.  2. History of diverticulitis-clinically patient is presently asymptomatic. He has no abdominal pain, nausea, vomiting. Patient was recently seen in the ER for this and was given a prescription for Cipro and Flagyl which she will continue and finish.  3. Folliculitis-patient had some axillary folliculitis. He was discharged empirically on 7 days of Augmentin.  4. Tremor/jerky lower extremity movements-etiology unclear. Patient was seen by neurology initially he responded to some IV Ativan. He was discharged on some Neurontin which helped to alleviate his symptoms. His neurologic workup was normal including MRI of the lumbar and thoracic spine along with his MRI of the brain.  5. Essential hypertension-patient will resume his Norvasc, metoprolol.  6. Hyperlipidemia-patient will continue his atorvastatin.  7. Hypothyroidism-patient will resume his Synthroid.  DISCHARGE CONDITIONS:   Stable  CONSULTS OBTAINED:  Treatment Team:  Catarina Hartshorn, MD Alexis Goodell, MD  DRUG ALLERGIES:   Allergies  Allergen Reactions  . Influenza Vaccines Other (See Comments)    Guillain Barre  . Declomycin [Demeclocycline]   . Demeclocycline Hcl   . Ginger   . Glipizide     Nausea   . Metformin And Related Nausea Only    Metformin IR 500 BID  . Other     Other reaction(s): Other (See Comments) Uncoded Allergy. Allergen: Other Allergy: See Patient Chart for Details, Other Reaction: Other reaction  . Remicade [Infliximab]   . Suvorexant Other (See Comments)    vision changes  . Clarithromycin Rash  . Pioglitazone Itching, Swelling and Rash    DISCHARGE MEDICATIONS:  Allergies as of 08/28/2017      Reactions   Influenza Vaccines Other (See Comments)   Guillain Barre   Declomycin [demeclocycline]    Demeclocycline Hcl    Ginger    Glipizide    Nausea   Metformin And Related Nausea Only   Metformin IR 500 BID   Other    Other  reaction(s): Other (See Comments) Uncoded Allergy. Allergen: Other Allergy: See Patient Chart for Details, Other Reaction: Other reaction   Remicade [infliximab]    Suvorexant Other (See Comments)   vision changes   Clarithromycin Rash   Pioglitazone Itching, Swelling, Rash      Medication List    STOP taking these medications   aspirin EC 81 MG tablet   HYDROcodone-acetaminophen 5-325 MG tablet Commonly known as:  NORCO/VICODIN   JANUVIA 100 MG tablet Generic drug:  sitaGLIPtin   metroNIDAZOLE 500 MG tablet Commonly known as:  FLAGYL   ondansetron 4 MG disintegrating tablet Commonly known as:  ZOFRAN ODT   testosterone 4 MG/24HR Pt24 patch Commonly known as:  ANDRODERM     TAKE these medications   amLODipine 5 MG tablet Commonly known as:  NORVASC TAKE ONE (1) TABLET EACH DAY   amoxicillin-clavulanate 875-125 MG tablet Commonly known as:  AUGMENTIN Take 1 tablet by mouth every 12 (twelve) hours for 7 days.   atorvastatin 10 MG tablet Commonly known as:  LIPITOR Take 1 tablet (10 mg total) by mouth every evening. What changed:  when to take this   ciprofloxacin 500 MG tablet Commonly known as:  CIPRO Take 1 tablet (500 mg total) by mouth 2 (two) times daily.   CUTIVATE 0.05 % Lotn Generic drug:  Fluticasone Propionate APPLY TO AFFECTED AREA TWICE A DAY AS NEEDED FOR RASH   DULoxetine 30 MG capsule Commonly known as:  CYMBALTA Take 1 capsule (30 mg total) by mouth daily.   famotidine 20 MG tablet Commonly known as:  PEPCID Take 1 tablet (20 mg total) by mouth 2 (two) times daily.   fentaNYL 50 MCG/HR Commonly known as:  DURAGESIC - dosed mcg/hr Place 1 patch (50 mcg total) every other day onto the skin.   gabapentin 300 MG capsule Commonly known as:  NEURONTIN Take 2 capsules (600 mg total) by mouth 3 (three) times daily.   levothyroxine 137 MCG tablet Commonly known as:  SYNTHROID, LEVOTHROID Take 1 tablet (137 mcg total) by mouth daily.    metoprolol tartrate 50 MG tablet Commonly known as:  LOPRESSOR TAKE ONE TABLET TWICE DAILY   omeprazole 20 MG capsule Commonly known as:  PRILOSEC Take 1 capsule (20 mg total) by mouth daily.   Vitamin D (Ergocalciferol) 50000 units Caps capsule Commonly known as:  DRISDOL Take 1 capsule (50,000 Units total) by mouth every 7 (seven) days.   zolpidem 10 MG tablet Commonly known as:  AMBIEN Take 1-2 tablets (10-20 mg total) by mouth at bedtime as needed. What changed:    how much to take  reasons to take this         DISCHARGE INSTRUCTIONS:   DIET:  Cardiac diet  DISCHARGE CONDITION:  Stable  ACTIVITY:  Activity as tolerated  OXYGEN:  Home Oxygen: No.   Oxygen Delivery: room air  DISCHARGE LOCATION:  home   If you experience worsening of your admission symptoms, develop shortness of breath, life threatening emergency, suicidal or homicidal thoughts you must seek medical attention immediately by calling 911 or calling your MD immediately  if symptoms less  severe.  You Must read complete instructions/literature along with all the possible adverse reactions/side effects for all the Medicines you take and that have been prescribed to you. Take any new Medicines after you have completely understood and accpet all the possible adverse reactions/side effects.   Please note  You were cared for by a hospitalist during your hospital stay. If you have any questions about your discharge medications or the care you received while you were in the hospital after you are discharged, you can call the unit and asked to speak with the hospitalist on call if the hospitalist that took care of you is not available. Once you are discharged, your primary care physician will handle any further medical issues. Please note that NO REFILLS for any discharge medications will be authorized once you are discharged, as it is imperative that you return to your primary care physician (or establish a  relationship with a primary care physician if you do not have one) for your aftercare needs so that they can reassess your need for medications and monitor your lab values.     Today   No acute events overnight. Ambulate with the help of physical therapy and did not require any services. Denies any headache, abnormal jerky movements of his lower extremities today.  VITAL SIGNS:  Blood pressure 138/74, pulse 78, temperature 98.7 F (37.1 C), temperature source Oral, resp. rate 18, weight 98.6 kg (217 lb 6.4 oz), SpO2 98 %.  I/O:    Intake/Output Summary (Last 24 hours) at 08/28/2017 1334 Last data filed at 08/28/2017 1000 Gross per 24 hour  Intake 360 ml  Output 500 ml  Net -140 ml    PHYSICAL EXAMINATION:  GENERAL:  61 y.o.-year-old patient lying in the bed with no acute distress.  EYES: Pupils equal, round, reactive to light and accommodation. No scleral icterus. Extraocular muscles intact.  HEENT: Head atraumatic, normocephalic. Oropharynx and nasopharynx clear.  NECK:  Supple, no jugular venous distention. No thyroid enlargement, no tenderness.  LUNGS: Normal breath sounds bilaterally, no wheezing, rales,rhonchi. No use of accessory muscles of respiration.  CARDIOVASCULAR: S1, S2 normal. No murmurs, rubs, or gallops.  ABDOMEN: Soft, non-tender, non-distended. Bowel sounds present. No organomegaly or mass.  EXTREMITIES: No pedal edema, cyanosis, or clubbing.  NEUROLOGIC: Cranial nerves II through XII are intact. No focal motor or sensory defecits b/l.  PSYCHIATRIC: The patient is alert and oriented x 3. SKIN: No obvious rash, lesion, or ulcer.   DATA REVIEW:   CBC Recent Labs  Lab 08/28/17 0428  WBC 8.0  HGB 12.9*  HCT 36.4*  PLT 258    Chemistries  Recent Labs  Lab 08/25/17 1650 08/28/17 0428  NA 137  --   K 4.1  --   CL 101  --   CO2 24  --   GLUCOSE 137*  --   BUN 11  --   CREATININE 0.70 0.85  CALCIUM 8.9  --   AST 29  --   ALT 24  --   ALKPHOS 74   --   BILITOT 0.7  --     Cardiac Enzymes Recent Labs  Lab 08/24/17 1033  TROPONINI <0.03    Microbiology Results  Results for orders placed or performed during the hospital encounter of 08/21/16  [facility] rt PCR ([place] only)     Status: None   Collection Time: 08/21/16  9:24 AM  Result Value Ref Range Status   Specimen source GC/Chlam URINE, RANDOM  Final  Chlamydia Tr NOT DETECTED NOT DETECTED Final   N gonorrhoeae NOT DETECTED NOT DETECTED Final    Comment: (NOTE) 100  This methodology has not been evaluated in pregnant women or in 200  patients with a history of hysterectomy. 300 400  This methodology will not be performed on patients less than 51  years of age.     RADIOLOGY:  Mr Thoracic Spine Wo Contrast  Result Date: 08/27/2017 CLINICAL DATA:  Initial evaluation for acute back pain, shaking spells, syncope. EXAM: MRI THORACIC AND LUMBAR SPINE WITHOUT CONTRAST TECHNIQUE: Multiplanar and multiecho pulse sequences of the thoracic and lumbar spine were obtained without intravenous contrast. COMPARISON:  None. FINDINGS: MRI THORACIC SPINE FINDINGS Alignment: Vertebral bodies normally aligned with preservation of the normal thoracic kyphosis. No listhesis. Vertebrae: Vertebral body heights well maintained without evidence for acute or chronic fracture. Bone marrow signal intensity within normal limits. No discrete or worrisome osseous lesions. No abnormal marrow edema. Cord: Short-segment flattening of the right posterior aspect of the thoracic spinal cord present at the level of T4 (series 7, image 12). Findings suggest a possible underlying lesion such is a subdural or intradural arachnoid cyst, not well delineated due to motion through this region. Similarly, an exact measurement is difficult to discern. Cord is mildly flattened at this level without cord signal changes. This is likely incidental in nature and of no clinical significance. Appearance of the thoracic spinal  cord is otherwise normal. No cord signal changes or other abnormality. Paraspinal and other soft tissues: Paraspinous soft tissues within normal limits. Visualized lungs are clear. Visualized visceral structures are normal. Disc levels: T3-4: Small central disc protrusion indents the ventral thecal sac without significant stenosis. T6-7: Tiny central disc protrusion minimally indents the ventral thecal sac without stenosis. T9-10: Mild degenerative disc bulging without significant stenosis. No other significant degenerative changes within the thoracic spine. No canal or foraminal stenosis. MRI LUMBAR SPINE FINDINGS Segmentation: Normal segmentation. Lowest well-formed disc labeled the L5-S1 level. Alignment: Trace retrolisthesis of L2 on L3. Vertebral bodies otherwise normally aligned with preservation of the normal lumbar lordosis. Vertebrae: Vertebral body heights are maintained without evidence for acute or chronic fracture. Bone marrow signal intensity within normal limits. Few small benign hemangiomas noted. No worrisome osseous lesions. Conus medullaris and cauda equina: Conus extends to the L1 level. Conus and cauda equina appear normal. Paraspinal and other soft tissues: Paraspinous soft tissues within normal limits. Visualized visceral structures are normal. Disc levels: L1-2: Mild diffuse disc bulge with disc desiccation. No significant stenosis. No impingement. L2-3: Trace retrolisthesis. Diffuse disc bulge with disc desiccation. Posterior annular fissure. Mild to moderate facet with ligamentum flavum hypertrophy. Superimposed 5 mm synovial cyst present at the anteromedial aspect of the left L2-3 facet (series 14, image 13). Resultant mild canal with mild to moderate bilateral lateral recess stenosis. Foramina remain patent. L3-4: Mild diffuse disc bulge with disc desiccation. Mild to moderate facet and ligament flavum hypertrophy. Mild canal with mild to moderate bilateral subarticular stenosis.  Borderline mild L3 foraminal narrowing. L4-5: Mild diffuse disc bulge with disc desiccation. Moderate facet arthrosis with ligamentum flavum hypertrophy. Mild reactive edema about the bilateral L4-5 facets. Moderate canal with moderate to severe bilateral subarticular stenosis. Mild left L4 foraminal narrowing. L5-S1: Disc desiccation with superimposed small central disc protrusion. Protruding disc closely approximates the descending S1 nerve roots without frank neural impingement or displacement. No stenosis. Mild facet hypertrophy. IMPRESSION: MR THORACIC SPINE IMPRESSION 1. No acute abnormality within the thoracic spine.  2. Small disc protrusion/disc bulging at C3-4, C6-7, and T9-10 without stenosis. 3. Short-segment flattening of the right posterior cord at the level of T4. Findings suggests the presence of a small subdural and/or intradural arachnoid cyst at this level, not well delineated on this exam due to motion. No associated cord signal changes or other abnormality. This finding is most likely incidental in nature, and of doubtful clinical significance. MR LUMBAR SPINE IMPRESSION 1. No acute abnormality within the lumbar spine. 2. Degenerative disc bulging with facet arthrosis at L4-5 with resultant moderate canal with moderate to severe bilateral subarticular stenosis. 3. Small central disc protrusion at L5-S1, closely approximating the descending S1 nerve roots without frank neural impingement or displacement. 4. Multifactorial degenerative changes at L2-3 and L3-4 with resultant mild canal with mild to moderate subarticular stenosis. Electronically Signed   By: Jeannine Boga M.D.   On: 08/27/2017 01:33   Mr Lumbar Spine Wo Contrast  Result Date: 08/27/2017 CLINICAL DATA:  Initial evaluation for acute back pain, shaking spells, syncope. EXAM: MRI THORACIC AND LUMBAR SPINE WITHOUT CONTRAST TECHNIQUE: Multiplanar and multiecho pulse sequences of the thoracic and lumbar spine were obtained  without intravenous contrast. COMPARISON:  None. FINDINGS: MRI THORACIC SPINE FINDINGS Alignment: Vertebral bodies normally aligned with preservation of the normal thoracic kyphosis. No listhesis. Vertebrae: Vertebral body heights well maintained without evidence for acute or chronic fracture. Bone marrow signal intensity within normal limits. No discrete or worrisome osseous lesions. No abnormal marrow edema. Cord: Short-segment flattening of the right posterior aspect of the thoracic spinal cord present at the level of T4 (series 7, image 12). Findings suggest a possible underlying lesion such is a subdural or intradural arachnoid cyst, not well delineated due to motion through this region. Similarly, an exact measurement is difficult to discern. Cord is mildly flattened at this level without cord signal changes. This is likely incidental in nature and of no clinical significance. Appearance of the thoracic spinal cord is otherwise normal. No cord signal changes or other abnormality. Paraspinal and other soft tissues: Paraspinous soft tissues within normal limits. Visualized lungs are clear. Visualized visceral structures are normal. Disc levels: T3-4: Small central disc protrusion indents the ventral thecal sac without significant stenosis. T6-7: Tiny central disc protrusion minimally indents the ventral thecal sac without stenosis. T9-10: Mild degenerative disc bulging without significant stenosis. No other significant degenerative changes within the thoracic spine. No canal or foraminal stenosis. MRI LUMBAR SPINE FINDINGS Segmentation: Normal segmentation. Lowest well-formed disc labeled the L5-S1 level. Alignment: Trace retrolisthesis of L2 on L3. Vertebral bodies otherwise normally aligned with preservation of the normal lumbar lordosis. Vertebrae: Vertebral body heights are maintained without evidence for acute or chronic fracture. Bone marrow signal intensity within normal limits. Few small benign  hemangiomas noted. No worrisome osseous lesions. Conus medullaris and cauda equina: Conus extends to the L1 level. Conus and cauda equina appear normal. Paraspinal and other soft tissues: Paraspinous soft tissues within normal limits. Visualized visceral structures are normal. Disc levels: L1-2: Mild diffuse disc bulge with disc desiccation. No significant stenosis. No impingement. L2-3: Trace retrolisthesis. Diffuse disc bulge with disc desiccation. Posterior annular fissure. Mild to moderate facet with ligamentum flavum hypertrophy. Superimposed 5 mm synovial cyst present at the anteromedial aspect of the left L2-3 facet (series 14, image 13). Resultant mild canal with mild to moderate bilateral lateral recess stenosis. Foramina remain patent. L3-4: Mild diffuse disc bulge with disc desiccation. Mild to moderate facet and ligament flavum hypertrophy. Mild canal with  mild to moderate bilateral subarticular stenosis. Borderline mild L3 foraminal narrowing. L4-5: Mild diffuse disc bulge with disc desiccation. Moderate facet arthrosis with ligamentum flavum hypertrophy. Mild reactive edema about the bilateral L4-5 facets. Moderate canal with moderate to severe bilateral subarticular stenosis. Mild left L4 foraminal narrowing. L5-S1: Disc desiccation with superimposed small central disc protrusion. Protruding disc closely approximates the descending S1 nerve roots without frank neural impingement or displacement. No stenosis. Mild facet hypertrophy. IMPRESSION: MR THORACIC SPINE IMPRESSION 1. No acute abnormality within the thoracic spine. 2. Small disc protrusion/disc bulging at C3-4, C6-7, and T9-10 without stenosis. 3. Short-segment flattening of the right posterior cord at the level of T4. Findings suggests the presence of a small subdural and/or intradural arachnoid cyst at this level, not well delineated on this exam due to motion. No associated cord signal changes or other abnormality. This finding is most  likely incidental in nature, and of doubtful clinical significance. MR LUMBAR SPINE IMPRESSION 1. No acute abnormality within the lumbar spine. 2. Degenerative disc bulging with facet arthrosis at L4-5 with resultant moderate canal with moderate to severe bilateral subarticular stenosis. 3. Small central disc protrusion at L5-S1, closely approximating the descending S1 nerve roots without frank neural impingement or displacement. 4. Multifactorial degenerative changes at L2-3 and L3-4 with resultant mild canal with mild to moderate subarticular stenosis. Electronically Signed   By: Jeannine Boga M.D.   On: 08/27/2017 01:33      Management plans discussed with the patient, family and they are in agreement.  CODE STATUS:     Code Status Orders  (From admission, onward)        Start     Ordered   08/26/17 0520  Full code  Continuous     08/26/17 0519    Code Status History    Date Active Date Inactive Code Status Order ID Comments User Context   This patient has a current code status but no historical code status.    Advance Directive Documentation     Most Recent Value  Type of Advance Directive  Healthcare Power of Attorney  Pre-existing out of facility DNR order (yellow form or pink MOST form)  No data  "MOST" Form in Place?  No data      TOTAL TIME TAKING CARE OF THIS PATIENT: 40 minutes.    Henreitta Leber M.D on 08/28/2017 at 1:34 PM  Between 7am to 6pm - Pager - [phone]  After 6pm go to [email or web address] - Proofreader  [facility]  Office  [phone]  CC: Primary care physician; Birdie Sons, MD

## 2017-08-28 NOTE — Progress Notes (Addendum)
Notified Dr. Tor Netters of ed pharmacist  Came to reconcile meds Dr. Tor Netters called back discharge orders in. Went over discharge, wheel chair and wheeled to car.

## 2017-09-01 ENCOUNTER — Other Ambulatory Visit: Payer: Self-pay | Admitting: Family Medicine

## 2017-09-01 DIAGNOSIS — L4050 Arthropathic psoriasis, unspecified: Secondary | ICD-10-CM

## 2017-09-02 ENCOUNTER — Telehealth: Payer: Self-pay | Admitting: Surgery

## 2017-09-02 MED ORDER — FENTANYL 50 MCG/HR TD PT72: 50.0000 ug | MEDICATED_PATCH | TRANSDERMAL | 0 refills | Status: DC

## 2017-09-02 NOTE — Telephone Encounter (Signed)
I have called patient to make an appointment per documentation from ED visit for generalized abdominal pain and sigmoid diverticulitis. No answer. I have left a message for patient to call back to make an appointment with any of our surgeons. -New patient.

## 2017-09-10 NOTE — ED Provider Notes (Signed)
[facility] Emergency Department Provider Note   First MD Initiated Contact with Patient 08/26/17 0205     (approximate)  I have reviewed the triage vital signs and the nursing notes.   HISTORY  Chief Complaint Loss of Consciousness   HPI Keith Carpenter is a 61 y.o. male with below list of chronic medical conditions including diabetes mellitus thoracic aortic aneurysm [name] returns to the emergency department after being discharged moments earlier following evaluation which revealed evidence of diverticulitis.  Patient sister states that on arrival to the car the patient fell forward onto the dashboard and became unresponsive briefly.  On arrival to the room the patient's eyes were closed however patient was able to follow verbal commands.  Of note when patient presented to the emergency department earlier he had 2 fentanyl patches on.  Patient also received Dilaudid while in the emergency department.   Past Medical History:  Diagnosis Date  . Collagen vascular disease (HCC)    RA  . Diabetes mellitus without complication ([place])   . Gastric ulcer   . H/O Guillain-Barre syndrome   . Helicobacter pylori gastritis treated 07/2017  . Hypertension   . Hypocalcemia   . Panic disorder   . Schamberg disease   . Thoracic aortic aneurysm (HCC)    4.4 cm followed at [facility]  . Thyroid cancer [facility])     Patient Active Problem List   Diagnosis Date Noted  . Weakness 08/26/2017  . Episode of shaking   . Neuropathy 01/22/2017  . Left testicular pain 08/18/2016  . 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 Hypothyroidism 08/12/2015  . H/O disease 07/12/2015  . Calcium deficiency disease 07/12/2015  . Burning sensation of the foot 07/12/2015  . 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 Neurocardiogenic syncope 07/12/2015  . Clinical depression 05/17/2015  . Psoriatic arthritis ([place]) 03/12/2015  . Aneurysm of thoracic aorta ([place])  01/28/2015  . Chest pain 06/21/2014  . Thoracic aortic aneurysm ([place])   . Essential hypertension   . Aneurysm of ascending aorta (HCC) 04/18/2014  . Adynamia 04/18/2014  . Vitamin D deficiency 07/15/2009  . Colonic constipation 02/24/2008  . Episodic paroxysmal anxiety disorder 02/22/2008  . RESTLESS LEGS SYNDROME 08/31/2007  . Type 2 diabetes mellitus ([place]) 08/30/2007  . Hyperlipemia 08/30/2007  . 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 Headache 08/30/2007  . Insomnia 08/17/2007  . Disorder of male genital organ 07/08/2007  . Acid reflux 11/26/2006  . Gastric ulcer 11/26/2006  . Headache, migraine 11/26/2006  . Depression, neurotic 11/25/2006    Past Surgical History:  Procedure Laterality Date  . CARDIAC CATHETERIZATION  06/07/2014   [facility]  . CARDIAC CATHETERIZATION  2015  . MENISCUS REPAIR Right   . THYROIDECTOMY      Prior to Admission medications   Medication Sig Start Date End Date Taking? Authorizing Provider  amLODipine (NORVASC) 5 MG tablet TAKE ONE (1) TABLET EACH DAY 10/27/16  Yes Birdie Sons, MD  atorvastatin (LIPITOR) 10 MG tablet Take 1 tablet (10 mg total) by mouth every evening. Patient taking differently: Take 10 mg by mouth daily.  04/05/17  Yes Birdie Sons, MD  CUTIVATE 0.05 % LOTN APPLY TO AFFECTED AREA TWICE A DAY AS NEEDED FOR RASH 02/04/16  Yes Margarita Rana, MD  DULoxetine (CYMBALTA) 30 MG capsule Take 1 capsule (30 mg total) by mouth daily. 08/18/17  Yes Birdie Sons, MD  famotidine (PEPCID) 20 MG tablet Take 1 tablet (20 mg total) by mouth  2 (two) times daily. 08/25/17  Yes Carrie Mew, MD  levothyroxine (SYNTHROID, LEVOTHROID) 137 MCG tablet Take 1 tablet (137 mcg total) by mouth daily. 01/29/17  Yes Birdie Sons, MD  metoprolol tartrate (LOPRESSOR) 50 MG tablet TAKE ONE TABLET TWICE DAILY 06/02/17  Yes Birdie Sons, MD  omeprazole (PRILOSEC) 20 MG capsule Take 1 capsule (20 mg total) by mouth daily. 08/06/17  Yes Birdie Sons, MD  Vitamin D, Ergocalciferol, (DRISDOL) 50000 units CAPS capsule Take 1 capsule (50,000 Units total) by mouth every 7 (seven) days. 03/26/17  Yes Birdie Sons, MD  zolpidem (AMBIEN) 10 MG tablet Take 1-2 tablets (10-20 mg total) by mouth at bedtime as needed. Patient taking differently: Take 15 mg by mouth at bedtime as needed for sleep.  08/06/17  Yes Birdie Sons, MD  ciprofloxacin (CIPRO) 500 MG tablet Take 1 tablet (500 mg total) by mouth 2 (two) times daily. Patient not taking: Reported on 08/28/2017 08/25/17   Carrie Mew, MD  fentaNYL (DURAGESIC - DOSED MCG/HR) 50 MCG/HR Place 1 patch (50 mcg total) onto the skin every other day. 09/02/17   Birdie Sons, MD  gabapentin (NEURONTIN) 300 MG capsule Take 2 capsules (600 mg total) by mouth 3 (three) times daily. 08/28/17 09/27/17  Henreitta Leber, MD    Allergies Influenza vaccines; Declomycin [demeclocycline]; Demeclocycline hcl; Ginger; Glipizide; Metformin and related; Other; Remicade [infliximab]; Suvorexant; Clarithromycin; and Pioglitazone  Family History  Problem Relation Age of Onset  . Hyperlipidemia Mother   . Heart attack Father 46  . Hypertension Father   . Hyperlipidemia Father   . Heart failure Father   . Prostate cancer Neg Hx   . Kidney cancer Neg Hx   . Bladder Cancer Neg Hx   . Kidney disease Neg Hx     Social History Social History   Tobacco Use  . Smoking status: Never Smoker  . Smokeless tobacco: Never Used  Substance Use Topics  . Alcohol use: No  . Drug use: No    Review of Systems Constitutional: No fever/chills Eyes: No visual changes. ENT: No sore throat. Cardiovascular: Denies chest pain. Respiratory: Denies shortness of breath. Gastrointestinal: No abdominal pain.  No nausea, no vomiting.  No diarrhea.  No constipation. Genitourinary: Negative for dysuria. Musculoskeletal: Negative for neck pain.  Negative for back pain. Integumentary: Negative for rash. Neurological:  Negative for headaches, focal weakness or numbness.   ____________________________________________   PHYSICAL EXAM:  VITAL SIGNS: ED Triage Vitals  Enc Vitals Group     BP 08/26/17 0153 (!) 182/81     Pulse Rate 08/26/17 0153 79     Resp 08/26/17 0153 18     Temp 08/26/17 0153 98 F (36.7 C)     Temp Source 08/26/17 0153 Oral     SpO2 08/26/17 0153 99 %     Weight 08/26/17 0152 98.4 kg (217 lb)     Height --      Head Circumference --      Peak Flow --      Pain Score 08/26/17 0519 0     Pain Loc --      Pain Edu? --      Excl. in [place]? --     Constitutional: Alert and oriented. Well appearing and in no acute distress. Eyes: Conjunctivae are normal. PERRL. EOMI. Head: Atraumatic. Mouth/Throat: Mucous membranes are moist.  Oropharynx non-erythematous. Neck: No stridor.   Cardiovascular: Normal rate, regular rhythm. Good peripheral circulation. Grossly  normal heart sounds. Respiratory: Normal respiratory effort.  No retractions. Lungs CTAB. Gastrointestinal: Soft and nontender. No distention.  Musculoskeletal: No lower extremity tenderness nor edema. No gross deformities of extremities. Neurologic:  Normal speech and language. No gross focal neurologic deficits are appreciated.  Skin:  Skin is warm, dry and intact. No rash noted. Psychiatric: Mood and affect are normal. Speech and behavior are normal.  ____________________________________________   LABS (all labs ordered are listed, but only abnormal results are displayed)  Labs Reviewed  GLUCOSE, CAPILLARY - Abnormal; Notable for the following components:      Result Value   Glucose-Capillary 132 (*)    All other components within normal limits  GLUCOSE, CAPILLARY - Abnormal; Notable for the following components:   Glucose-Capillary 149 (*)    All other components within normal limits  CBC - Abnormal; Notable for the following components:   RBC 4.27 (*)    Hemoglobin 12.9 (*)    HCT 36.4 (*)    All other  components within normal limits  GLUCOSE, CAPILLARY - Abnormal; Notable for the following components:   Glucose-Capillary 110 (*)    All other components within normal limits  GLUCOSE, CAPILLARY - Abnormal; Notable for the following components:   Glucose-Capillary 104 (*)    All other components within normal limits  GLUCOSE, CAPILLARY - Abnormal; Notable for the following components:   Glucose-Capillary 130 (*)    All other components within normal limits  CREATININE, SERUM   ____________________________________________  EKG  ED ECG REPORT I, [place] Stephinie Battisti, the attending physician, personally viewed and interpreted this ECG.   Date: 09/10/2017  EKG Time: 1:50 AM  Rate: 71  Rhythm: Normal sinus rhythm  Axis: Normal  Intervals: Normal  ST&T Change: None  _________________________    Procedures   ____________________________________________   INITIAL IMPRESSION / ASSESSMENT AND PLAN / ED COURSE  As part of my medical decision making, I reviewed the following data within the electronic MEDICAL RECORD [id number] year old male with above-stated history and physical exam.  Patient alert and oriented following verbal commands on arrival to the room.  However given patient's syncopal episode will admit the patient for observation.  Patient discussed with Dr. Marcille Blanco ____________________________________________  FINAL CLINICAL IMPRESSION(S) / ED DIAGNOSES  Diverticulitis Syncope  MEDICATIONS GIVEN DURING THIS VISIT:  Medications  sodium chloride 0.9 % bolus 1,000 mL (0 mLs Intravenous Stopped 08/26/17 0300)  ketorolac (TORADOL) 30 MG/ML injection 30 mg (30 mg Intravenous Given 08/26/17 0323)     ED Discharge Orders        Ordered    gabapentin (NEURONTIN) 300 MG capsule  3 times daily     08/28/17 1136    Activity as tolerated - No restrictions     08/28/17 1136    Diet - low sodium heart healthy     08/28/17 1136    amoxicillin-clavulanate (AUGMENTIN) 875-125 MG  tablet  Every 12 hours     08/28/17 1325       Note:  This document was prepared using Dragon voice recognition software and may include unintentional dictation errors.    Gregor Hams, MD 09/10/17 [id number]

## 2017-09-15 ENCOUNTER — Encounter: Payer: Self-pay | Admitting: Family Medicine

## 2017-09-15 ENCOUNTER — Ambulatory Visit: Payer: BC Managed Care – PPO | Admitting: Family Medicine

## 2017-09-15 VITALS — BP 132/78 | HR 59 | Temp 99.4°F | Resp 16 | Wt 223.0 lb

## 2017-09-15 DIAGNOSIS — G629 Polyneuropathy, unspecified: Secondary | ICD-10-CM

## 2017-09-15 DIAGNOSIS — L4050 Arthropathic psoriasis, unspecified: Secondary | ICD-10-CM | POA: Diagnosis not present

## 2017-09-15 DIAGNOSIS — L282 Other prurigo: Secondary | ICD-10-CM | POA: Diagnosis not present

## 2017-09-15 DIAGNOSIS — Z1211 Encounter for screening for malignant neoplasm of colon: Secondary | ICD-10-CM | POA: Diagnosis not present

## 2017-09-15 DIAGNOSIS — G610 Guillain-Barre syndrome: Secondary | ICD-10-CM | POA: Diagnosis not present

## 2017-09-15 DIAGNOSIS — R5383 Other fatigue: Secondary | ICD-10-CM | POA: Diagnosis not present

## 2017-09-15 MED ORDER — GABAPENTIN 600 MG PO TABS: 300.0000 mg | ORAL_TABLET | Freq: Three times a day (TID) | ORAL | 0 refills | Status: DC

## 2017-09-15 NOTE — Progress Notes (Signed)
Patient: Keith Carpenter Male    DOB: 1956-03-10   62 y.o.   MRN: [id number] Visit Date: 09/15/2017  Today's Provider: Lelon Huh, MD   Chief Complaint  Patient presents with  . Fatigue    4 week follow up   Subjective:    HPI 4 week follow up:  Patient was last seen 4 weeks ago for Fatigue. Changes made during that visit includes restarting Cymbalta 30mg  daily and advising patient to continue to hold Januvia and Gabapentin. Since last visit patient has been hospitalized and he reports the fatigue has worsened.  Was restarted on gabapentin while hospitalized.    Follow up Hospitalization  Patient was admitted to [facility] on 08/26/2017 and discharged on 08/28/2017. He was treated for weakness, pneumonia and diverticulitis.  He reports good compliance with treatment. He reports this condition is Unchanged.Still feels fatigued and has swelling of the testicle and flank pain  Also reports that he is having episodes of itching all over, usually in the evenings or at night, associated with palms turning red.  ------------------------------------------------------------------------------------       Allergies  Allergen Reactions  . Influenza Vaccines Other (See Comments)    Guillain Barre  . Declomycin [Demeclocycline]   . Demeclocycline Hcl   . Ginger   . Glipizide     Nausea   . Metformin And Related Nausea Only    Metformin IR 500 BID  . Other     Other reaction(s): Other (See Comments) Uncoded Allergy. Allergen: Other Allergy: See Patient Chart for Details, Other Reaction: Other reaction  . Remicade [Infliximab]   . Suvorexant Other (See Comments)    vision changes  . Clarithromycin Rash  . Pioglitazone Itching, Swelling and Rash     Current Outpatient Medications:  .  amLODipine (NORVASC) 5 MG tablet, TAKE ONE (1) TABLET EACH DAY, Disp: 30 tablet, Rfl: 6 .  atorvastatin (LIPITOR) 10 MG tablet, Take 1 tablet (10 mg total) by mouth every evening.  (Patient taking differently: Take 10 mg by mouth daily. ), Disp: 90 tablet, Rfl: 3 .  CUTIVATE 0.05 % LOTN, APPLY TO AFFECTED AREA TWICE A DAY AS NEEDED FOR RASH, Disp: 120 mL, Rfl: 5 .  DULoxetine (CYMBALTA) 30 MG capsule, Take 1 capsule (30 mg total) by mouth daily., Disp: 30 capsule, Rfl: 1 .  fentaNYL (DURAGESIC - DOSED MCG/HR) 50 MCG/HR, Place 1 patch (50 mcg total) onto the skin every other day., Disp: 15 patch, Rfl: 0 .  gabapentin (NEURONTIN) 600 MG tablet, Take 600 mg by mouth 3 (three) times daily., Disp: , Rfl:  .  levothyroxine (SYNTHROID, LEVOTHROID) 137 MCG tablet, Take 1 tablet (137 mcg total) by mouth daily., Disp: 90 tablet, Rfl: 3 .  metoprolol tartrate (LOPRESSOR) 50 MG tablet, TAKE ONE TABLET TWICE DAILY, Disp: 60 tablet, Rfl: 5 .  omeprazole (PRILOSEC) 20 MG capsule, Take 1 capsule (20 mg total) by mouth daily., Disp: 30 capsule, Rfl: 0 .  Vitamin D, Ergocalciferol, (DRISDOL) 50000 units CAPS capsule, Take 1 capsule (50,000 Units total) by mouth every 7 (seven) days., Disp: 12 capsule, Rfl: 4 .  zolpidem (AMBIEN) 10 MG tablet, Take 1-2 tablets (10-20 mg total) by mouth at bedtime as needed. (Patient taking differently: Take 15 mg by mouth at bedtime as needed for sleep. ), Disp: 60 tablet, Rfl: 5 .  ciprofloxacin (CIPRO) 500 MG tablet, Take 1 tablet (500 mg total) by mouth 2 (two) times daily. (Patient not taking: Reported on 08/28/2017),  Disp: 14 tablet, Rfl: 0 .  famotidine (PEPCID) 20 MG tablet, Take 1 tablet (20 mg total) by mouth 2 (two) times daily. (Patient not taking: Reported on 09/15/2017), Disp: 60 tablet, Rfl: 0 .  gabapentin (NEURONTIN) 300 MG capsule, Take 2 capsules (600 mg total) by mouth 3 (three) times daily., Disp: 180 capsule, Rfl: 0  Review of Systems  Constitutional: Positive for fatigue. Negative for appetite change, chills and fever.  Respiratory: Negative for chest tightness, shortness of breath and wheezing.   Cardiovascular: Negative for chest pain and  palpitations.  Gastrointestinal: Negative for abdominal pain, nausea and vomiting.  Genitourinary: Positive for scrotal swelling.  Skin: Positive for rash.       Itching all over his body    Social History   Tobacco Use  . Smoking status: Never Smoker  . Smokeless tobacco: Never Used  Substance Use Topics  . Alcohol use: No   Objective:   BP 132/78 (BP Location: Right Arm, Patient Position: Sitting, Cuff Size: Large)   Pulse (!) 59   Temp 99.4 F (37.4 C) (Oral)   Resp 16   Wt 223 lb (101.2 kg)   SpO2 96% Comment: room air  BMI 31.10 kg/m  There were no vitals filed for this visit.   Physical Exam   General Appearance:    Alert, cooperative, no distress  Eyes:    PERRL, conjunctiva/corneas clear, EOM's intact       Lungs:     Clear to auscultation bilaterally, respirations unlabored  Heart:    Regular rate and rhythm  Neurologic:   Awake, alert, oriented x 3. No apparent focal neurological           defect.           Assessment & Plan:     1. Psoriatic arthritis ([place])   2. Pruritic rash Unclear etiology. Can try OTC claritin or Zyrtec  3. Screening for colon cancer  - Ambulatory referral to Gastroenterology  4. GUILLAIN-BARRE SYNDROME He states he has been on fentanyl ever since GBS diagnosis years ago. He would like to attempt weaning fentanyl. Will reduce to 525 mcg with next refill which is due January 19th  5. Neuropathy   6. Other fatigue No clear etiology on work up at recent hospitalization, but likely multifactorial. May be exacerbated by depression. May be exacerbated by medication. Will work on reducing fentanyl dose as above.        Lelon Huh, MD  [facility]

## 2017-09-27 ENCOUNTER — Other Ambulatory Visit: Payer: Self-pay | Admitting: Family Medicine

## 2017-09-27 DIAGNOSIS — L4050 Arthropathic psoriasis, unspecified: Secondary | ICD-10-CM

## 2017-09-27 MED ORDER — FENTANYL 25 MCG/HR TD PT72: 25.0000 ug | MEDICATED_PATCH | TRANSDERMAL | 0 refills | Status: DC

## 2017-09-29 ENCOUNTER — Ambulatory Visit (INDEPENDENT_AMBULATORY_CARE_PROVIDER_SITE_OTHER): Payer: BC Managed Care – PPO | Admitting: Family Medicine

## 2017-09-29 ENCOUNTER — Encounter: Payer: Self-pay | Admitting: Family Medicine

## 2017-09-29 VITALS — BP 144/80 | HR 60 | Temp 98.8°F | Resp 18

## 2017-09-29 DIAGNOSIS — R1013 Epigastric pain: Secondary | ICD-10-CM | POA: Diagnosis not present

## 2017-09-29 DIAGNOSIS — R61 Generalized hyperhidrosis: Secondary | ICD-10-CM

## 2017-09-29 DIAGNOSIS — R1084 Generalized abdominal pain: Secondary | ICD-10-CM

## 2017-09-29 DIAGNOSIS — Z125 Encounter for screening for malignant neoplasm of prostate: Secondary | ICD-10-CM

## 2017-09-29 MED ORDER — METRONIDAZOLE 500 MG PO TABS: 500.0000 mg | ORAL_TABLET | Freq: Two times a day (BID) | ORAL | 0 refills | Status: DC

## 2017-09-29 MED ORDER — CIPROFLOXACIN HCL 500 MG PO TABS: 500.0000 mg | ORAL_TABLET | Freq: Two times a day (BID) | ORAL | 0 refills | Status: AC

## 2017-09-29 NOTE — Progress Notes (Signed)
Patient: Keith Carpenter Male    DOB: 1956-03-25   62 y.o.   MRN: [id number] Visit Date: 09/29/2017  Today's Provider: Lelon Huh, MD   Chief Complaint  Patient presents with  . Fatigue   Subjective:    HPI Follow up: He presents for follow up of abdominal pain which he was first seen. He states he has abdominal pain, fatigue, excessive sweating and no appetite. Patient states he sweats so much that he has to changes clothes 4-5 times a day. We first saw him for epigastric pain on 07-20-2017. He had positive h. Pylori antibodies which was treated. Follow up breath test on 08-11-2017 was negative.  He had ER visit on 08-25-17 with findings consistent with diverticulitis on abdominal CT. Radiologist also reported enlarged prostate with focal hyperdense nodule impressing the bass of the bladder. He was treated with Augmentin. Patient stopped taking Gabapentin a few days ago due to it causing severe abdominal pain. Abdominal pain never completely resolved. Has been waxing and waning but getting worse over the last week. States it feels like a hot poker in his stomach. Pain is sometimes in epigastrium an sometime lower abdomen. Current pain has migrated to left lower quadrant. Epigastric pain is worse when he eats. He was prescribed famotidine from ER but states that the omeprazole that was prescribed when he was being treated for h. Pylori helped more. He denies any change in stool consistency or frequency, is not constipated. Feels nauseated, but having no vomiting.  He also expressed interest in weaning fentanyl at his last visit on 09-15-2017. The plan is to reduce next patch to 44mg, but he has another week left of the 562m.    He also states he has stopped taking gabapentin completely due to rash he was having on his hands.     Allergies  Allergen Reactions  . Influenza Vaccines Other (See Comments)    Guillain Barre  . Declomycin [Demeclocycline]   . Demeclocycline Hcl   .  Ginger   . Glipizide     Nausea   . Metformin And Related Nausea Only    Metformin IR 500 BID  . Other     Other reaction(s): Other (See Comments) Uncoded Allergy. Allergen: Other Allergy: See Patient Chart for Details, Other Reaction: Other reaction  . Remicade [Infliximab]   . Suvorexant Other (See Comments)    vision changes  . Clarithromycin Rash  . Pioglitazone Itching, Swelling and Rash     Current Outpatient Medications:  .  amLODipine (NORVASC) 5 MG tablet, TAKE ONE (1) TABLET EACH DAY, Disp: 30 tablet, Rfl: 6 .  atorvastatin (LIPITOR) 10 MG tablet, Take 1 tablet (10 mg total) by mouth every evening. (Patient taking differently: Take 10 mg by mouth daily. ), Disp: 90 tablet, Rfl: 3 .  CUTIVATE 0.05 % LOTN, APPLY TO AFFECTED AREA TWICE A DAY AS NEEDED FOR RASH, Disp: 120 mL, Rfl: 5 .  DULoxetine (CYMBALTA) 30 MG capsule, Take 1 capsule (30 mg total) by mouth daily., Disp: 30 capsule, Rfl: 1 .  famotidine (PEPCID) 20 MG tablet, Take 20 mg by mouth 2 (two) times daily., Disp: , Rfl: 0 .  fentaNYL (DURAGESIC - DOSED MCG/HR) 25 MCG/HR patch, Place 1 patch (25 mcg total) onto the skin every other day., Disp: 15 patch, Rfl: 0 .  levothyroxine (SYNTHROID, LEVOTHROID) 137 MCG tablet, Take 1 tablet (137 mcg total) by mouth daily., Disp: 90 tablet, Rfl: 3 .  metoprolol tartrate (  LOPRESSOR) 50 MG tablet, TAKE ONE TABLET TWICE DAILY, Disp: 60 tablet, Rfl: 5 .  omeprazole (PRILOSEC) 20 MG capsule, Take 1 capsule (20 mg total) by mouth daily., Disp: 30 capsule, Rfl: 0 .  Vitamin D, Ergocalciferol, (DRISDOL) 50000 units CAPS capsule, Take 1 capsule (50,000 Units total) by mouth every 7 (seven) days., Disp: 12 capsule, Rfl: 4 .  zolpidem (AMBIEN) 10 MG tablet, Take 1-2 tablets (10-20 mg total) by mouth at bedtime as needed. (Patient taking differently: Take 15 mg by mouth at bedtime as needed for sleep. ), Disp: 60 tablet, Rfl: 5 .  gabapentin (NEURONTIN) 600 MG tablet, Take 0.5 tablets (300 mg  total) by mouth 3 (three) times daily. (Patient not taking: Reported on 09/29/2017), Disp: 1 tablet, Rfl: 0  Review of Systems  Constitutional: Positive for activity change, appetite change (no appetite), diaphoresis and fatigue. Negative for chills and fever.  Respiratory: Negative for chest tightness, shortness of breath and wheezing.   Cardiovascular: Negative for chest pain and palpitations.  Gastrointestinal: Positive for abdominal pain. Negative for nausea and vomiting.    Social History   Tobacco Use  . Smoking status: Never Smoker  . Smokeless tobacco: Never Used  Substance Use Topics  . Alcohol use: No   Objective:   BP (!) 144/80 (BP Location: Left Arm, Patient Position: Sitting, Cuff Size: Large)   Pulse 60   Temp 98.8 F (37.1 C) (Oral)   Resp 18   SpO2 96% Comment: room air There were no vitals filed for this visit.   Physical Exam  General Appearance:    Alert, cooperative, no distress  Eyes:    PERRL, conjunctiva/corneas clear, EOM's intact       Lungs:     Clear to auscultation bilaterally, respirations unlabored  Heart:    Regular rate and rhythm  Abdomen:   bowel sounds present and normal in all 4 quadrants, soft, round, non-distended. Diffuse abdominal tenderness. No rebound or guarding.         Assessment & Plan:     1. Epigastric pain H. Pylori breath test in December was negative. Will change from famotidine back to omeprazole which was more effective in the past.  - Ambulatory referral to Gastroenterology  2. Generalized abdominal pain No clear etiology, but he was treated for diverticulitis in December based on CT findings. Will start back on ciprofloxacin and Flagyl. He is due for screening colonoscopy but may need additional GI work up. Will refer GI.  - CBC with Differential/Platelet - Comprehensive metabolic panel - Sed Rate (ESR) - Ambulatory referral to Gastroenterology  3. Diaphoresis  - CBC with Differential/Platelet - Comprehensive  metabolic panel - Sed Rate (ESR)  4. Prostate cancer screening  - PSA       Lelon Huh, MD  [facility]

## 2017-09-30 LAB — CBC WITH DIFFERENTIAL/PLATELET
Basophils Absolute: 0 10*3/uL (ref 0.0–0.2)
Basos: 0 %
EOS (ABSOLUTE): 0.1 10*3/uL (ref 0.0–0.4)
EOS: 1 %
HEMATOCRIT: 41.8 % (ref 37.5–51.0)
Hemoglobin: 14.7 g/dL (ref 13.0–17.7)
Immature Grans (Abs): 0 10*3/uL (ref 0.0–0.1)
Immature Granulocytes: 0 %
LYMPHS ABS: 1.4 10*3/uL (ref 0.7–3.1)
Lymphs: 17 %
MCH: 29.8 pg (ref 26.6–33.0)
MCHC: 35.2 g/dL (ref 31.5–35.7)
MCV: 85 fL (ref 79–97)
MONOS ABS: 0.4 10*3/uL (ref 0.1–0.9)
Monocytes: 4 %
Neutrophils Absolute: 6.3 10*3/uL (ref 1.4–7.0)
Neutrophils: 78 %
Platelets: 302 10*3/uL (ref 150–379)
RBC: 4.94 x10E6/uL (ref 4.14–5.80)
RDW: 13.6 % (ref 12.3–15.4)
WBC: 8.2 10*3/uL (ref 3.4–10.8)

## 2017-09-30 LAB — COMPREHENSIVE METABOLIC PANEL
A/G RATIO: 1.5 (ref 1.2–2.2)
ALK PHOS: 79 IU/L (ref 39–117)
ALT: 24 IU/L (ref 0–44)
AST: 22 IU/L (ref 0–40)
Albumin: 4.6 g/dL (ref 3.5–5.5)
BILIRUBIN TOTAL: 0.3 mg/dL (ref 0.0–1.2)
BUN/Creatinine Ratio: 17 (ref 9–20)
BUN: 14 mg/dL (ref 6–24)
CHLORIDE: 100 mmol/L (ref 96–106)
CO2: 26 mmol/L (ref 20–29)
Calcium: 9.1 mg/dL (ref 8.7–10.2)
Creatinine, Ser: 0.84 mg/dL (ref 0.76–1.27)
GFR calc Af Amer: 111 mL/min/{1.73_m2} (ref >59)
GFR calc non Af Amer: 96 mL/min/{1.73_m2} (ref >59)
GLOBULIN, TOTAL: 3 g/dL (ref 1.5–4.5)
Glucose: 174 mg/dL — ABNORMAL HIGH (ref 65–99)
POTASSIUM: 4.7 mmol/L (ref 3.5–5.2)
SODIUM: 142 mmol/L (ref 134–144)
Total Protein: 7.6 g/dL (ref 6.0–8.5)

## 2017-09-30 LAB — SEDIMENTATION RATE: Sed Rate: 13 mm/hr (ref 0–30)

## 2017-09-30 LAB — PSA: PROSTATE SPECIFIC AG, SERUM: 1.6 ng/mL (ref 0.0–4.0)

## 2017-10-05 ENCOUNTER — Other Ambulatory Visit: Payer: Self-pay | Admitting: Family Medicine

## 2017-10-05 MED ORDER — OMEPRAZOLE 20 MG PO CPDR: 20.0000 mg | DELAYED_RELEASE_CAPSULE | Freq: Every day | ORAL | 4 refills | Status: DC

## 2017-10-05 NOTE — Telephone Encounter (Signed)
[facility] faxed a refill request for the following medication. Thanks CC  omeprazole (PRILOSEC) 20 MG capsule

## 2017-10-07 ENCOUNTER — Telehealth: Payer: Self-pay | Admitting: Family Medicine

## 2017-10-07 DIAGNOSIS — E291 Testicular hypofunction: Secondary | ICD-10-CM

## 2017-10-07 DIAGNOSIS — I10 Essential (primary) hypertension: Secondary | ICD-10-CM

## 2017-10-07 DIAGNOSIS — E119 Type 2 diabetes mellitus without complications: Secondary | ICD-10-CM

## 2017-10-07 DIAGNOSIS — E208 Other hypoparathyroidism: Secondary | ICD-10-CM

## 2017-10-07 NOTE — Telephone Encounter (Signed)
Pt stated that he wasn't sure which OV this month but he had discuss being referred to Endocrinology for his glucose fluctuating between elevated and low. Pt stated that he would like to move forward with the referral so he can see an endocrinologist. Please advise. Thanks TNP

## 2017-10-08 NOTE — Telephone Encounter (Signed)
Patient was advised.  

## 2017-10-08 NOTE — Telephone Encounter (Signed)
Have sent referral order to sarah

## 2017-10-11 ENCOUNTER — Encounter: Payer: Self-pay | Admitting: Gastroenterology

## 2017-10-11 ENCOUNTER — Ambulatory Visit: Payer: BC Managed Care – PPO | Admitting: Gastroenterology

## 2017-10-11 VITALS — BP 149/99 | HR 91 | Temp 99.0°F | Ht 71.0 in | Wt 213.2 lb

## 2017-10-11 DIAGNOSIS — R197 Diarrhea, unspecified: Secondary | ICD-10-CM

## 2017-10-11 DIAGNOSIS — R109 Unspecified abdominal pain: Secondary | ICD-10-CM

## 2017-10-11 DIAGNOSIS — R634 Abnormal weight loss: Secondary | ICD-10-CM

## 2017-10-11 NOTE — Progress Notes (Signed)
[name] MD, MRCP(U.K) [address]  [place]  [place], [place] [address]  Main: [phone]  Fax: [phone]   Gastroenterology Consultation  Referring Provider:     Birdie Sons, MD Primary Care Physician:  Birdie Sons, MD Primary Gastroenterologist:  Dr. [name]  Reason for Consultation:  Abdominal pain         HPI:   Keith Carpenter is a 62 y.o. y/o male referred for consultation & management  by Dr. Caryn Section, Kirstie Peri, MD.     He has been referred for epigastric pain . Treated for positive H pylori antibody back in 07/2017 . Eradication confirmed. ER visit in 08/2017 for diverticulitis of the descending colon . CBC,CMP normal in 08/2017 . Has been taken two courses of antibiotics for diverticulitis. Finished antibiotics last  week.    Abdominal pain: Onset: 06/2017 , increasing since then , initially thought it was a virus. Felt initially better after H pylori treatment , pain recurred 2 weeks after, same type of pain , on left side of the abdomen , treated for diverticulitis which seemed to help .Subsequently a few weeks back, the left side of his abdomen started to hurt. Went back to see his PCP, started back on antibiotics- help a bit. Presently has left sided abdominal pain. On and off pain , each episode of pain lasts about 1 hour. In  A day gets 4-5 episodes.   Site :left side  Radiation: localized  Severity :8/10  Nature of pain: epigastric area is dull and left side pain is sharp  Aggravating factors: eating -within minutes  Relieving factors :pepcid Weight loss: 30 lbs weight loss in 4 months NSAID use: none  PPI use :omeprazole 20 mg  Gall bladder surgery: no  Frequency of bowel movements: usually 2 times a day , presently diarrhea- going 5-6 times a day , no blood. Had blood in stool when he had diverticulitis. Never had a colonoscopy. No family history of colon cancer, sister had colon polyps  Change in bowel movements: yes  Relief  with bowel movements: yes  Gas/Bloating/Abdominal distension: yes    Lots of gas- no artificial sugars recently  Past Medical History:  Diagnosis Date  . Collagen vascular disease (HCC)    RA  . Diabetes mellitus without complication ([place])   . Gastric ulcer   . H/O Guillain-Barre syndrome   . Helicobacter pylori gastritis treated 07/2017  . Hypertension   . Hypocalcemia   . Panic disorder   . Schamberg disease   . Thoracic aortic aneurysm (HCC)    4.4 cm [facility] at [facility]  . Thyroid cancer [facility])     Past Surgical History:  Procedure Laterality Date  . CARDIAC CATHETERIZATION  06/07/2014   [facility]  . CARDIAC CATHETERIZATION  2015  . MENISCUS REPAIR Right   . THYROIDECTOMY      Prior to Admission medications   Medication Sig Start Date End Date Taking? Authorizing Provider  amLODipine (NORVASC) 5 MG tablet TAKE ONE (1) TABLET EACH DAY 10/27/16   Birdie Sons, MD  atorvastatin (LIPITOR) 10 MG tablet Take 1 tablet (10 mg total) by mouth every evening. Patient taking differently: Take 10 mg by mouth daily.  04/05/17   Birdie Sons, MD  CUTIVATE 0.05 % LOTN APPLY TO AFFECTED AREA TWICE A DAY AS NEEDED FOR RASH 02/04/16   Margarita Rana, MD  DULoxetine (CYMBALTA) 30 MG capsule Take 1 capsule (30 mg total) by mouth daily. 08/18/17  Birdie Sons, MD  famotidine (PEPCID) 20 MG tablet Take 20 mg by mouth 2 (two) times daily. 08/28/17   [provider]  fentaNYL (DURAGESIC - DOSED MCG/HR) 25 MCG/HR patch Place 1 patch (25 mcg total) onto the skin every other day. 09/27/17   Birdie Sons, MD  levothyroxine (SYNTHROID, LEVOTHROID) 137 MCG tablet Take 1 tablet (137 mcg total) by mouth daily. 01/29/17   Birdie Sons, MD  metoprolol tartrate (LOPRESSOR) 50 MG tablet TAKE ONE TABLET TWICE DAILY 06/02/17   Birdie Sons, MD  metroNIDAZOLE (FLAGYL) 500 MG tablet Take 1 tablet (500 mg total) by mouth 2 (two) times daily. 09/29/17   Birdie Sons, MD  omeprazole (PRILOSEC)  20 MG capsule Take 1 capsule (20 mg total) by mouth daily. 10/05/17   Birdie Sons, MD  ondansetron (ZOFRAN-ODT) 4 MG disintegrating tablet dissolve 1 tablet ON TONGUE every 8 hours if needed for nausea and vomiting 08/28/17   [provider]  Vitamin D, Ergocalciferol, (DRISDOL) 50000 units CAPS capsule Take 1 capsule (50,000 Units total) by mouth every 7 (seven) days. 03/26/17   Birdie Sons, MD  zolpidem (AMBIEN) 10 MG tablet Take 1-2 tablets (10-20 mg total) by mouth at bedtime as needed. Patient taking differently: Take 15 mg by mouth at bedtime as needed for sleep.  08/06/17   Birdie Sons, MD    Family History  Problem Relation Age of Onset  . Hyperlipidemia Mother   . Heart attack Father 45  . Hypertension Father   . Hyperlipidemia Father   . Heart failure Father   . Prostate cancer Neg Hx   . Kidney cancer Neg Hx   . Bladder Cancer Neg Hx   . Kidney disease Neg Hx      Social History   Tobacco Use  . Smoking status: Never Smoker  . Smokeless tobacco: Never Used  Substance Use Topics  . Alcohol use: No  . Drug use: No    Allergies as of 10/11/2017 - Review Complete 09/15/2017  Allergen Reaction Noted  . Influenza vaccines Other (See Comments) 09/27/2015  . Declomycin [demeclocycline]  06/04/2014  . Demeclocycline hcl    . Ginger  06/04/2014  . Glipizide  01/04/2017  . Metformin and related Nausea Only 08/18/2016  . Other    . Remicade [infliximab]    . Suvorexant Other (See Comments) 07/12/2015  . Clarithromycin Rash 08/06/2017  . Pioglitazone Itching, Swelling, and Rash 01/14/2017    Review of Systems:    All systems reviewed and negative except where noted in HPI.   Physical Exam:  There were no vitals taken for this visit. No LMP for male patient. Psych:  Alert and cooperative. Normal mood and affect. General:   Alert,  Well-developed, well-nourished, pleasant and cooperative in NAD Head:  Normocephalic and atraumatic. Eyes:  Sclera  clear, no icterus.   Conjunctiva pink. Ears:  Normal auditory acuity. Nose:  No deformity, discharge, or lesions. Mouth:  No deformity or lesions,oropharynx pink & moist. Neck:  Supple; no masses or thyromegaly. Lungs:  Respirations even and unlabored.  Clear throughout to auscultation.   No wheezes, crackles, or rhonchi. No acute distress. Heart:  Regular rate and rhythm; no murmurs, clicks, rubs, or gallops. Abdomen:  Normal bowel sounds.  No bruits.  Soft, non-tender and non-distended without masses, hepatosplenomegaly or hernias noted.  No guarding or rebound tenderness.     Neurologic:  Alert and oriented x3;  grossly normal neurologically. Skin:  Intact without significant lesions or rashes. No jaundice. Lymph Nodes:  No significant cervical adenopathy. Psych:  Alert and cooperative. Normal mood and affect.  Imaging Studies: No results found.  Assessment and Plan:   Keith Carpenter is a 62 y.o. y/o male has been referred for epiagstric , left sided abdominal pain , descending colon diverticulitis, unintentional weight loss. Also has diarrhea after 3 courses of antibiotics.    Plan   1.Repeat CT scan  to check for resolution of diverticulitis.  2. R/o stool C diff and PCR, check celiac serology  3. If CT scan of the abdomen is negative then will need urgent EGD+colonoscopy to evaluate weight loss and abdominal pain .    I have discussed alternative options, risks & benefits,  which include, but are not limited to, bleeding, infection, perforation,respiratory complication & drug reaction.  The patient agrees with this plan & written consent will be obtained.         Follow up in 4 weeks.   Dr [name] MD,MRCP(U.K)

## 2017-10-13 ENCOUNTER — Telehealth: Payer: Self-pay | Admitting: Family Medicine

## 2017-10-13 LAB — CELIAC DISEASE PANEL
Endomysial IgA: NEGATIVE
IgA/Immunoglobulin A, Serum: 239 mg/dL (ref 90–386)
Transglutaminase IgA: <2 U/mL (ref 0–3)

## 2017-10-13 NOTE — Telephone Encounter (Signed)
Ok to refer? Please review. Thanks!

## 2017-10-13 NOTE — Telephone Encounter (Signed)
Per [facility] (Pt's request) they will be able to get pt in within a month.Information faxed.[place] office will contact pt

## 2017-10-13 NOTE — Telephone Encounter (Signed)
Can you please see If there is another endocrinologist that can see him sooner. Thanks.

## 2017-10-13 NOTE — Telephone Encounter (Signed)
Pt requesting a referral to a different Endocrinology Office.  States where he was referred to is going to be too long before they are able to get him in.  He wants to be referred elsewhere for as soon as possible appt. States his cell is the best way to contact him

## 2017-10-14 ENCOUNTER — Other Ambulatory Visit: Payer: Self-pay

## 2017-10-14 ENCOUNTER — Telehealth: Payer: Self-pay

## 2017-10-14 DIAGNOSIS — A498 Other bacterial infections of unspecified site: Secondary | ICD-10-CM

## 2017-10-14 LAB — GI PROFILE, STOOL, PCR
Adenovirus F 40/41: NOT DETECTED
Astrovirus: NOT DETECTED
C difficile toxin A/B: DETECTED — AB
CRYPTOSPORIDIUM: NOT DETECTED
CYCLOSPORA CAYETANENSIS: NOT DETECTED
Campylobacter: NOT DETECTED
ENTAMOEBA HISTOLYTICA: NOT DETECTED
ENTEROTOXIGENIC E COLI: NOT DETECTED
Enteroaggregative E coli: NOT DETECTED
Enteropathogenic E coli: NOT DETECTED
GIARDIA LAMBLIA: NOT DETECTED
NOROVIRUS GI/GII: NOT DETECTED
PLESIOMONAS SHIGELLOIDES: NOT DETECTED
Rotavirus A: NOT DETECTED
SALMONELLA: NOT DETECTED
SHIGELLA/ENTEROINVASIVE E COLI: NOT DETECTED
Sapovirus: NOT DETECTED
Shiga-toxin-producing E coli: NOT DETECTED
VIBRIO: NOT DETECTED
Vibrio cholerae: NOT DETECTED
YERSINIA ENTEROCOLITICA: NOT DETECTED

## 2017-10-14 LAB — CALPROTECTIN, FECAL: Calprotectin, Fecal: 74 ug/g (ref 0–120)

## 2017-10-14 LAB — CLOSTRIDIUM DIFFICILE EIA: C difficile Toxins A+B, EIA: NEGATIVE

## 2017-10-14 MED ORDER — VANCOMYCIN HCL 250 MG PO CAPS: 250.0000 mg | ORAL_CAPSULE | Freq: Four times a day (QID) | ORAL | 0 refills | Status: AC

## 2017-10-14 NOTE — Telephone Encounter (Signed)
Advised patient of results per Dr. Vicente Males.    - inform GI PCR has detected C diff - please give either vancomycin 250 mg QID for 14 days or dificid for 10 days . Follow up with me after the antibiotics to see how he is doing and to decide on timing of colonoscpy   Contacted pharmacy. Vancomycin available. Patient to pick-up.

## 2017-10-18 ENCOUNTER — Ambulatory Visit: Payer: Self-pay | Admitting: Family Medicine

## 2017-10-18 ENCOUNTER — Telehealth: Payer: Self-pay | Admitting: Gastroenterology

## 2017-10-18 NOTE — Telephone Encounter (Signed)
PAM WITH THE SERVICE CENTER CALLED TO LET us KNOW THAT THE INCORRECT STUDY HAD BEEN PRECERTED .PATIENT WAS TO HAVE CT ABD/PEL WITH CONTRAST BUT CT PELVIS WAS AUTHORIZED. PAM IS GOING TO CANCEL THIS PROCEDURE.PLEASE CALL PATIENT & RESCHEDULE. IF YOU NEED TO TALK WITH PAM SHE CAN BE REACHED AT [phone]     PAM CALLED BACK & SHE WAS ABLE TO GO IN THE INSURANCE AND GET IT APPROVED.

## 2017-10-19 ENCOUNTER — Ambulatory Visit
Admission: RE | Admit: 2017-10-19 | Discharge: 2017-10-19 | Disposition: A | Discharge status: Home / Self Care | Payer: BC Managed Care – PPO | Source: Ambulatory Visit | Attending: Gastroenterology | Admitting: Gastroenterology

## 2017-10-19 DIAGNOSIS — R197 Diarrhea, unspecified: Secondary | ICD-10-CM | POA: Diagnosis present

## 2017-10-19 DIAGNOSIS — R109 Unspecified abdominal pain: Secondary | ICD-10-CM

## 2017-10-19 DIAGNOSIS — N400 Enlarged prostate without lower urinary tract symptoms: Secondary | ICD-10-CM | POA: Diagnosis not present

## 2017-10-19 DIAGNOSIS — R634 Abnormal weight loss: Secondary | ICD-10-CM

## 2017-10-19 DIAGNOSIS — K4020 Bilateral inguinal hernia, without obstruction or gangrene, not specified as recurrent: Secondary | ICD-10-CM | POA: Diagnosis not present

## 2017-10-19 MED ORDER — IOPAMIDOL (ISOVUE-300) INJECTION 61%
100.0000 mL | Freq: Once | INTRAVENOUS | Status: AC | PRN
  Administered 2017-10-19: 100 mL via INTRAVENOUS

## 2017-10-22 ENCOUNTER — Other Ambulatory Visit: Payer: Self-pay

## 2017-10-22 DIAGNOSIS — K469 Unspecified abdominal hernia without obstruction or gangrene: Secondary | ICD-10-CM

## 2017-10-22 NOTE — Progress Notes (Signed)
Advised patient of results per Dr. Vicente Males.   Referral sent to Dr. Dahlia Byes.   Patient to continue vancomycin for c-diff treatment until completed.   Patient advised to try Imodium to help with diarrhea during the process.

## 2017-10-25 ENCOUNTER — Other Ambulatory Visit: Payer: Self-pay

## 2017-10-25 DIAGNOSIS — K439 Ventral hernia without obstruction or gangrene: Secondary | ICD-10-CM

## 2017-10-25 NOTE — Progress Notes (Signed)
Sent patient's hernia evaluation referral to [facility] per request.   Patient states he doesn't like having procedures with [facility] due to the use of multiple vendors.

## 2017-10-26 ENCOUNTER — Other Ambulatory Visit: Payer: Self-pay | Admitting: Family Medicine

## 2017-10-26 DIAGNOSIS — L4050 Arthropathic psoriasis, unspecified: Secondary | ICD-10-CM

## 2017-10-27 NOTE — Telephone Encounter (Signed)
Patient requesting refills. Thanks!  

## 2017-10-28 ENCOUNTER — Other Ambulatory Visit: Payer: Self-pay | Admitting: Family Medicine

## 2017-10-28 ENCOUNTER — Telehealth: Payer: Self-pay | Admitting: Gastroenterology

## 2017-10-28 MED ORDER — FENTANYL 25 MCG/HR TD PT72: 25.0000 ug | MEDICATED_PATCH | TRANSDERMAL | 0 refills | Status: DC

## 2017-10-28 NOTE — Telephone Encounter (Signed)
Patient LVM regarding a surgical referral. Want to schedule with Dr. Marlyn Corporal at [place]

## 2017-11-02 ENCOUNTER — Encounter: Payer: Self-pay | Admitting: *Deleted

## 2017-11-10 ENCOUNTER — Ambulatory Visit: Payer: Self-pay | Admitting: General Surgery

## 2017-11-16 ENCOUNTER — Ambulatory Visit: Payer: BC Managed Care – PPO | Admitting: Family Medicine

## 2017-11-16 ENCOUNTER — Encounter: Payer: Self-pay | Admitting: Family Medicine

## 2017-11-16 ENCOUNTER — Ambulatory Visit: Payer: Self-pay | Admitting: General Surgery

## 2017-11-16 VITALS — BP 128/84 | HR 68 | Temp 98.9°F | Resp 16 | Wt 221.0 lb

## 2017-11-16 DIAGNOSIS — L4050 Arthropathic psoriasis, unspecified: Secondary | ICD-10-CM

## 2017-11-16 DIAGNOSIS — N50812 Left testicular pain: Secondary | ICD-10-CM

## 2017-11-16 DIAGNOSIS — F329 Major depressive disorder, single episode, unspecified: Secondary | ICD-10-CM | POA: Diagnosis not present

## 2017-11-16 DIAGNOSIS — F1190 Opioid use, unspecified, uncomplicated: Secondary | ICD-10-CM

## 2017-11-16 DIAGNOSIS — J019 Acute sinusitis, unspecified: Secondary | ICD-10-CM

## 2017-11-16 DIAGNOSIS — F32A Depression, unspecified: Secondary | ICD-10-CM

## 2017-11-16 DIAGNOSIS — M5126 Other intervertebral disc displacement, lumbar region: Secondary | ICD-10-CM

## 2017-11-16 DIAGNOSIS — R300 Dysuria: Secondary | ICD-10-CM | POA: Diagnosis not present

## 2017-11-16 LAB — POCT URINALYSIS DIPSTICK
Glucose, UA: NEGATIVE
Leukocytes, UA: NEGATIVE
NITRITE UA: NEGATIVE
ODOR: NORMAL
PH UA: 6 (ref 5.0–8.0)
Spec Grav, UA: 1.025 (ref 1.010–1.025)
UROBILINOGEN UA: 0.2 U/dL

## 2017-11-16 MED ORDER — DULOXETINE HCL 30 MG PO CPEP: 30.0000 mg | ORAL_CAPSULE | Freq: Every day | ORAL | 2 refills | Status: DC

## 2017-11-16 MED ORDER — FLUTICASONE PROPIONATE 50 MCG/ACT NA SUSP: 2.0000 | Freq: Every day | NASAL | 6 refills | Status: DC

## 2017-11-16 NOTE — Addendum Note (Signed)
Addended by: Julieta Bellini on: 11/16/2017 10:08 AM   Modules accepted: Orders

## 2017-11-16 NOTE — Progress Notes (Addendum)
Patient: Keith Carpenter Male    DOB: 1955-10-14   62 y.o.   MRN: [id number] Visit Date: 11/16/2017  Today's Provider: Lelon Huh, MD   Chief Complaint  Patient presents with  . Sinusitis  . Cough   Subjective:    Patient has had sinus pressure and cough for 3 weeks. Other symptoms include: headaches, chest congestion, sore throat, right ear pressure. Patient has been taking otc mucinex and humidifier with mild relief.    Sinusitis  This is a new problem. The current episode started 1 to 4 weeks ago (3 weeks). The problem has been gradually worsening since onset. There has been no fever. The pain is mild. Associated symptoms include congestion, coughing, headaches, a hoarse voice, sinus pressure and a sore throat. Pertinent negatives include no chills, diaphoresis, ear pain or shortness of breath. Treatments tried: mucinex. The treatment provided mild relief.  He recently completed course of vancomycin for c. Diff colitis  And doesn't feel like he needs anymore antibiotic.   Patient also here to follow up on several other chronic conditions. He is primarily concerned about worsening back pain. He was hospitalized in December for pneumonia and syncope. Dr. Omer Jack was consulted who ordered MRI of thoracic and lumbar spine showing multilevel disk desiccation. Patient states that he saw neurosurgeon many years ago in [place] and advised that the only thing that could be done for his back was chronic pain medications. He had been on Fentanyl 44mcg patches for several years until he was advised by hospital physical in December that he needs to wean off it. We reduced dose to 75mcg in January and he states pain has been much worse every since then. He has started taking hydrocodone/apap twice a day every day which upsets his stomach, however he does not want to go back up on Fentanyl dose. Marland KitchenHe would like to see a pain management specialist.  He also reports that he has been much  more depressed the last several weeks which he attributes to limitations in physical activity from worsening pain. He had been on 60mg  duloxetine in the past which was discontinued for unclear reasons last year. We restarted 30mg  in December but he states he ran out 2-3 weeks.   He also complains of several months of pain in left flank radiating into groin and testicles and burning with urination and swelling in his scrotum.   He also reports history of psoriatic arthritis diagnosed many years ago and treated at [facility] briefly with Remicade which apparently triggered GBS. He states that he was told only thing that could be done was to treat with pain medications since he didn't tolerated Remicade. Has not had follow up with rheumatology since 2008. Prior to being referred to [facility] he was followed by his dermatologist Dr. Koleen Nimrod. He now states he is having more extensive psoriatic plaques    Allergies  Allergen Reactions  . Influenza Vaccines Other (See Comments)    Guillain Barre  . Declomycin [Demeclocycline]   . Demeclocycline Hcl   . Ginger   . Glipizide     Nausea   . Metformin And Related Nausea Only    Metformin IR 500 BID  . Other     Other reaction(s): Other (See Comments) Uncoded Allergy. Allergen: Other Allergy: See Patient Chart for Details, Other Reaction: Other reaction  . Remicade [Infliximab]   . Suvorexant Other (See Comments)    vision changes  . Clarithromycin Rash  . Pioglitazone Itching,  Swelling and Rash     Current Outpatient Medications:  .  amLODipine (NORVASC) 5 MG tablet, TAKE ONE (1) TABLET EACH DAY, Disp: 30 tablet, Rfl: 6 .  atorvastatin (LIPITOR) 10 MG tablet, Take 1 tablet (10 mg total) by mouth every evening. (Patient taking differently: Take 10 mg by mouth daily. ), Disp: 90 tablet, Rfl: 3 .  CUTIVATE 0.05 % LOTN, APPLY TO AFFECTED AREA TWICE A DAY AS NEEDED FOR RASH, Disp: 120 mL, Rfl: 5 .  DULoxetine (CYMBALTA) 30 MG capsule, Take 1 capsule (30 mg  total) by mouth daily. Increase to 2 daily after 1 week if needed, Disp: 30 capsule, Rfl: 2 .  famotidine (PEPCID) 20 MG tablet, Take 20 mg by mouth 2 (two) times daily., Disp: , Rfl: 0 .  fentaNYL (DURAGESIC - DOSED MCG/HR) 25 MCG/HR patch, Place 1 patch (25 mcg total) onto the skin every other day., Disp: 15 patch, Rfl: 0 .  fluticasone (FLONASE) 50 MCG/ACT nasal spray, Place 2 sprays into both nostrils daily., Disp: 16 g, Rfl: 6 .  HYDROcodone-acetaminophen (NORCO/VICODIN) 5-325 MG tablet, TAKE 1 TABLET BY MOUTH TWICE A DAY AS NEEDED FOR MODERATE PAIN (Patient taking differently: No sig reported), Disp: 60 tablet, Rfl: 0 .  levothyroxine (SYNTHROID, LEVOTHROID) 137 MCG tablet, Take 1 tablet (137 mcg total) by mouth daily., Disp: 90 tablet, Rfl: 3 .  metoprolol tartrate (LOPRESSOR) 50 MG tablet, TAKE ONE TABLET TWICE DAILY, Disp: 60 tablet, Rfl: 5 .  omeprazole (PRILOSEC) 20 MG capsule, Take 1 capsule (20 mg total) by mouth daily., Disp: 30 capsule, Rfl: 4 .  ondansetron (ZOFRAN-ODT) 4 MG disintegrating tablet, dissolve 1 tablet ON TONGUE every 8 hours if needed for nausea and vomiting, Disp: , Rfl: 0 .  Vitamin D, Ergocalciferol, (DRISDOL) 50000 units CAPS capsule, Take 1 capsule (50,000 Units total) by mouth every 7 (seven) days., Disp: 12 capsule, Rfl: 4 .  zolpidem (AMBIEN) 10 MG tablet, Take 1-2 tablets (10-20 mg total) by mouth at bedtime as needed. (Patient taking differently: Take 15 mg by mouth at bedtime as needed for sleep. ), Disp: 60 tablet, Rfl: 5  Review of Systems  Constitutional: Negative for appetite change, chills, diaphoresis and fever.  HENT: Positive for congestion, hoarse voice, postnasal drip, rhinorrhea, sinus pressure, sinus pain and sore throat. Negative for ear pain.   Respiratory: Positive for cough. Negative for chest tightness, shortness of breath and wheezing.   Cardiovascular: Negative for chest pain and palpitations.  Gastrointestinal: Negative for abdominal pain,  nausea and vomiting.  Neurological: Positive for headaches.    Social History   Tobacco Use  . Smoking status: Never Smoker  . Smokeless tobacco: Never Used  Substance Use Topics  . Alcohol use: No   Objective:   BP 128/84 (BP Location: Right Arm, Patient Position: Sitting, Cuff Size: Large)   Pulse 68   Temp 98.9 F (37.2 C) (Oral)   Resp 16   Wt 221 lb (100.2 kg)   SpO2 98%   BMI 30.82 kg/m  Vitals:   11/16/17 0951  BP: 128/84  Pulse: 68  Resp: 16  Temp: 98.9 F (37.2 C)  TempSrc: Oral  SpO2: 98%  Weight: 221 lb (100.2 kg)     Physical Exam  General Appearance:    Alert, cooperative, no distress  HENT:   bilateral TM normal without fluid or infection, neck without nodes, pharynx erythematous without exudate, sinuses nontender and nasal mucosa congested  Eyes:    PERRL, conjunctiva/corneas clear, EOM's  intact       Lungs:     Clear to auscultation bilaterally, respirations unlabored  Heart:    Regular rate and rhythm  Neurologic:   Awake, alert, oriented x 3. No apparent focal neurological           defect.           Assessment & Plan:     1. Lumbar herniated disc Reviewed recent MRI and discussed options of seeing neurosurgeon versus more aggressive pain medication regiments. He is interested in seeing if he is a candidate for any corrective procedures.  - Ambulatory referral to Neurosurgery Consider referral to Dr. Holley Raring after neurosurgery evaluation. He still has a few weeks of fentanul 86mcg patches and does not want to go back up on dose yet.   2. Chronic, continuous use of opioids   3. Depression, unspecified depression type Previously did well on 60mg  duloxetine. Will start back on 30 and increase to 60 a day if tolerating.  - DULoxetine (CYMBALTA) 30 MG capsule; Take 1 capsule (30 mg total) by mouth daily. Increase to 2 daily after 1 week if needed  Dispense: 30 capsule; Refill: 2  4. Psoriatic arthritis ([place]) Remotely diagnosed, but not seen  by rheumatology for over a decade. He feels like psoriatic plaques and arthralgias have gotten progressively worse.  - Ambulatory referral to Rheumatology  5. Dysuria  - POCT urinalysis dipstick  6. Testicular pain, left Recommended referral to urology, but he would like to see neurosurgery first to see if if pain might be related to his disk disease.   7. Acute sinusitis, recurrence not specified, unspecified location Has been on several antibiotic over the last few months and doubt much of a bacterial component to sx.  - fluticasone (FLONASE) 50 MCG/ACT nasal spray; Place 2 sprays into both nostrils daily.  Dispense: 16 g; Refill: 6   Addressed extensive list of chronic and acute medical problems today requiring extensive time in counseling and coordination of care.  Over half of this 45 minute visit were spent in counseling and coordinating care of multiple medical problems.       Lelon Huh, MD  [facility]

## 2017-11-18 ENCOUNTER — Other Ambulatory Visit: Payer: Self-pay | Admitting: Family Medicine

## 2017-11-18 DIAGNOSIS — I10 Essential (primary) hypertension: Secondary | ICD-10-CM

## 2017-11-23 ENCOUNTER — Ambulatory Visit: Payer: Self-pay | Admitting: General Surgery

## 2017-12-06 ENCOUNTER — Other Ambulatory Visit: Payer: Self-pay | Admitting: Family Medicine

## 2017-12-06 DIAGNOSIS — Z79891 Long term (current) use of opiate analgesic: Secondary | ICD-10-CM | POA: Insufficient documentation

## 2017-12-06 DIAGNOSIS — L409 Psoriasis, unspecified: Secondary | ICD-10-CM | POA: Insufficient documentation

## 2017-12-06 DIAGNOSIS — L4050 Arthropathic psoriasis, unspecified: Secondary | ICD-10-CM

## 2017-12-06 NOTE — Telephone Encounter (Signed)
Patient needs Fentanyl transdermal 25 mcg sent into [facility].

## 2017-12-07 MED ORDER — FENTANYL 25 MCG/HR TD PT72: 25.0000 ug | MEDICATED_PATCH | TRANSDERMAL | 0 refills | Status: DC

## 2017-12-09 ENCOUNTER — Ambulatory Visit: Payer: Self-pay | Admitting: General Surgery

## 2017-12-21 ENCOUNTER — Encounter: Payer: Self-pay | Admitting: General Surgery

## 2017-12-21 ENCOUNTER — Ambulatory Visit: Payer: BC Managed Care – PPO | Admitting: General Surgery

## 2017-12-21 VITALS — BP 142/80 | HR 69 | Resp 16 | Ht 71.0 in | Wt 216.0 lb

## 2017-12-21 DIAGNOSIS — K4020 Bilateral inguinal hernia, without obstruction or gangrene, not specified as recurrent: Secondary | ICD-10-CM | POA: Diagnosis not present

## 2017-12-21 NOTE — Patient Instructions (Addendum)
The patient is aware to call back for any questions or concerns.   Inguinal Hernia, Adult An inguinal hernia is when fat or the intestines push through the area where the leg meets the lower belly (groin) and make a rounded lump (bulge). This condition happens over time. There are three types of inguinal hernias. These types include:  Hernias that can be pushed back into the belly (are reducible).  Hernias that cannot be pushed back into the belly (are incarcerated).  Hernias that cannot be pushed back into the belly and lose their blood supply (get strangulated). This type needs emergency surgery.  Follow these instructions at home: Lifestyle  Drink enough fluid to keep your urine (pee) clear or pale yellow.  Eat plenty of fruits, vegetables, and whole grains. These have a lot of fiber. Talk with your doctor if you have questions.  Avoid lifting heavy objects.  Avoid standing for long periods of time.  Do not use tobacco products. These include cigarettes, chewing tobacco, or e-cigarettes. If you need help quitting, ask your doctor.  Try to stay at a healthy weight. General instructions  Do not try to force the hernia back in.  Watch your hernia for any changes in color or size. Let your doctor know if there are any changes.  Take over-the-counter and prescription medicines only as told by your doctor.  Keep all follow-up visits as told by your doctor. This is important. Contact a doctor if:  You have a fever.  You have new symptoms.  Your symptoms get worse. Get help right away if:  The area where the legs meets the lower belly has: ? Pain that gets worse suddenly. ? A bulge that gets bigger suddenly and does not go down. ? A bulge that turns red or purple. ? A bulge that is painful to the touch.  You are a man and your scrotum: ? Suddenly feels painful. ? Suddenly changes in size.  You feel sick to your stomach (nauseous) and this feeling does not go  away.  You throw up (vomit) and this keeps happening.  You feel your heart beating a lot more quickly than normal.  You cannot poop (have a bowel movement) or pass gas. This information is not intended to replace advice given to you by your health care provider. Make sure you discuss any questions you have with your health care provider. Document Released: 10/01/2006 Document Revised: 02/06/2016 Document Reviewed: 07/11/2014 Elsevier Interactive Patient Education  2018 [employer].

## 2017-12-21 NOTE — Progress Notes (Signed)
Patient ID: Keith Carpenter, male   DOB: 1956/05/04, 62 y.o.   MRN: [id number]  Chief Complaint  Patient presents with  . Hernia    HPI Keith Carpenter is a 62 y.o. male.  Patient here today for an evaluation of possible hernias referred by Dr Vicente Males.  He states he has had some pain since October 2018. He states late October he was feeling bad and tested positive for H Pylori. He also had diverticulitis as well. He states the antibiotics then caused C. Diff. The pain is upper abdominal pain and umbilical pain.  He noticed bleeding in his belly button area on Sunday. CT abdomen was 10-19-17. No nausea, vomiting, or constipation. He had the Flu early March. He does have a rash arms and legs and has an appointment with Dermatology on Monday. He does take prescription pain medications for chronic pain and RA. Pain clinic appointment is 01-05-18. 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  HPI  Past Medical History:  Diagnosis Date  . Collagen vascular disease ([place])    RA diagnosed in his 22's  . Diabetes mellitus without complication ([place])   . Gastric ulcer   . H/O Guillain-Barre syndrome   . Helicobacter pylori gastritis treated 07/2017  . Hypertension   . Hypocalcemia   . Panic disorder   . Schamberg disease   . Thoracic aortic aneurysm ([place]) 04/2014   4.4 cm followed at [facility]  . Thyroid cancer ([place]) 2009    Past Surgical History:  Procedure Laterality Date  . CARDIAC CATHETERIZATION  06/07/2014   [facility]  . COLONOSCOPY    . MENISCUS REPAIR Right   . THYROIDECTOMY  2009    Family History  Problem Relation Age of Onset  . Hyperlipidemia Mother   . Heart attack Father 11  . Hypertension Father   . Hyperlipidemia Father   . Heart failure Father   . Prostate cancer Neg Hx   . Kidney cancer Neg Hx   . Bladder Cancer Neg Hx   . Kidney disease Neg Hx     Social History Social History    Tobacco Use  . Smoking status: Never Smoker  . Smokeless tobacco: Never Used  Substance Use Topics  . Alcohol use: No  . Drug use: No    Allergies  Allergen Reactions  . Influenza Vaccines Other (See Comments)    Guillain Barre  . Declomycin [Demeclocycline]   . Demeclocycline Hcl   . Ginger   . Glipizide     Nausea   . Metformin And Related Nausea Only    Metformin IR 500 BID  . Other     Other reaction(s): Other (See Comments) Uncoded Allergy. Allergen: Other Allergy: See Patient Chart for Details, Other Reaction: Other reaction  . Remicade [Infliximab]   . Suvorexant Other (See Comments)    vision changes  . Clarithromycin Rash  . Pioglitazone Itching, Swelling and Rash    Current Outpatient Medications  Medication Sig Dispense Refill  . amLODipine (NORVASC) 5 MG tablet TAKE ONE (1) TABLET EACH DAY 30 tablet 5  . atorvastatin (LIPITOR) 10 MG tablet Take 1 tablet (10 mg total) by mouth every evening. (Patient taking differently: Take 10 mg by mouth daily. ) 90 tablet 3  . CUTIVATE 0.05 % LOTN APPLY TO AFFECTED AREA TWICE A DAY AS NEEDED FOR RASH 120 mL 5  . DULoxetine (CYMBALTA) 30 MG capsule  Take 1 capsule (30 mg total) by mouth daily. Increase to 2 daily after 1 week if needed 30 capsule 2  . fentaNYL (DURAGESIC - DOSED MCG/HR) 25 MCG/HR patch Place 1 patch (25 mcg total) onto the skin every other day. 15 patch 0  . HYDROcodone-acetaminophen (NORCO/VICODIN) 5-325 MG tablet TAKE 1 TABLET BY MOUTH TWICE A DAY AS NEEDED FOR MODERATE PAIN (Patient taking differently: No sig reported) 60 tablet 0  . levothyroxine (SYNTHROID, LEVOTHROID) 137 MCG tablet Take 1 tablet (137 mcg total) by mouth daily. 90 tablet 3  . metoprolol tartrate (LOPRESSOR) 50 MG tablet TAKE ONE TABLET TWICE DAILY 60 tablet 5  . omeprazole (PRILOSEC) 20 MG capsule Take 1 capsule (20 mg total) by mouth daily. 30 capsule 4  . Vitamin D, Ergocalciferol, (DRISDOL) 50000 units CAPS capsule Take 1 capsule  (50,000 Units total) by mouth every 7 (seven) days. 12 capsule 4  . zolpidem (AMBIEN) 10 MG tablet Take 1-2 tablets (10-20 mg total) by mouth at bedtime as needed. (Patient taking differently: Take 15 mg by mouth at bedtime as needed for sleep. ) 60 tablet 5   No current facility-administered medications for this visit.     Review of Systems Review of Systems  Constitutional: Negative.   Respiratory: Negative.   Cardiovascular: Negative.   Gastrointestinal: Positive for abdominal pain. Negative for constipation and diarrhea.    Blood pressure (!) 142/80, pulse 69, resp. rate 16, height 5\' 11"  (1.803 m), weight 216 lb (98 kg), SpO2 97 %.  Physical Exam Physical Exam  Constitutional: He is oriented to person, place, and time. He appears well-developed and well-nourished.  HENT:  Mouth/Throat: Oropharynx is clear and moist.  Eyes: Conjunctivae are normal. No scleral icterus.  Neck: Neck supple.  Cardiovascular: Normal rate, regular rhythm and normal heart sounds.  Pulmonary/Chest: Effort normal and breath sounds normal.  Abdominal: Soft. Normal appearance and bowel sounds are normal. There is no tenderness. A hernia is present. Hernia confirmed positive in the left inguinal area. Hernia confirmed negative in the right inguinal area.  4 mm ulcerated lesion at base of belly button-silver nitrate application  Genitourinary: Testes normal.     Lymphadenopathy:    He has no cervical adenopathy.  Neurological: He is alert and oriented to person, place, and time.  Skin: Skin is warm and dry.  Psychiatric: His behavior is normal.    Data Reviewed Records from November 2018 through the present reviewed.  Elevated IgG for H. pylori.  Follow-up breath test negative.  Culture of stool positive for C. difficile, negative for toxin. Chest x-ray of August 26, 2017 notable for borderline cardiomegaly.  Low lung volumes.  Mild bibasilar atelectasis.  CT of the abdomen and pelvis dated  August 25, 1637 reported uncomplicated descending diverticulitis with pericolonic inflammation.  Enlarged prostate.  Follow-up CT of August 18, 2018 reviewed.  Resolution of diverticulitis.  Bilateral fat-containing inguinal hernias left greater than right.  Suggestion of fatty inflammation on the left side.  Persistent parietal static enlargement.  The above CT scans were reviewed.  Assessment    Bilateral, asymptomatic inguinal hernia.    Plan    The patient has had quite a ride over the last 6 months with multiple medical adventures including H. pylori treatment, admission for pneumonia, C. difficile colitis and diverticulitis.  I think now that he is back at work as a Oceanographer, exercising and started to regain his strength, and is asymptomatic in regards to his hernias, observation is warranted  at this time.  I anticipate by fall he will will add 100% and we can elect to proceed with hernia repair at that time.  The patient is aware to call early if he develops any symptoms related to his hernias (discussed in detail).   Follow up in 6 months.     HPI, Physical Exam, Assessment and Plan have been scribed under the direction and in the presence of Joandy Bellow, MD. Karie Fetch, RN  I have completed the exam and reviewed the above documentation for accuracy and completeness.  I agree with the above.  Haematologist has been used and any errors in dictation or transcription are unintentional.  Hervey Ard, M.D., F.A.C.S.  Forest Gleason Karinna Beadles 12/22/2017, 4:05 PM

## 2017-12-22 DIAGNOSIS — K4020 Bilateral inguinal hernia, without obstruction or gangrene, not specified as recurrent: Secondary | ICD-10-CM | POA: Insufficient documentation

## 2017-12-30 ENCOUNTER — Other Ambulatory Visit: Payer: Self-pay | Admitting: Family Medicine

## 2017-12-30 DIAGNOSIS — I10 Essential (primary) hypertension: Secondary | ICD-10-CM

## 2018-01-05 ENCOUNTER — Other Ambulatory Visit: Payer: Self-pay

## 2018-01-05 ENCOUNTER — Ambulatory Visit
Payer: BC Managed Care – PPO | Attending: Student in an Organized Health Care Education/Training Program | Admitting: Student in an Organized Health Care Education/Training Program

## 2018-01-05 ENCOUNTER — Encounter: Payer: Self-pay | Admitting: Student in an Organized Health Care Education/Training Program

## 2018-01-05 ENCOUNTER — Other Ambulatory Visit: Payer: Self-pay | Admitting: Family Medicine

## 2018-01-05 VITALS — BP 140/78 | HR 57 | Temp 98.6°F | Resp 18 | Ht 71.0 in | Wt 215.0 lb

## 2018-01-05 DIAGNOSIS — G8929 Other chronic pain: Secondary | ICD-10-CM | POA: Diagnosis not present

## 2018-01-05 DIAGNOSIS — M5136 Other intervertebral disc degeneration, lumbar region: Secondary | ICD-10-CM

## 2018-01-05 DIAGNOSIS — G629 Polyneuropathy, unspecified: Secondary | ICD-10-CM | POA: Diagnosis not present

## 2018-01-05 DIAGNOSIS — G894 Chronic pain syndrome: Secondary | ICD-10-CM

## 2018-01-05 DIAGNOSIS — M79671 Pain in right foot: Secondary | ICD-10-CM | POA: Insufficient documentation

## 2018-01-05 DIAGNOSIS — M9983 Other biomechanical lesions of lumbar region: Secondary | ICD-10-CM | POA: Diagnosis not present

## 2018-01-05 DIAGNOSIS — M25561 Pain in right knee: Secondary | ICD-10-CM | POA: Diagnosis not present

## 2018-01-05 DIAGNOSIS — M5416 Radiculopathy, lumbar region: Secondary | ICD-10-CM | POA: Diagnosis not present

## 2018-01-05 DIAGNOSIS — L4050 Arthropathic psoriasis, unspecified: Secondary | ICD-10-CM

## 2018-01-05 DIAGNOSIS — M5126 Other intervertebral disc displacement, lumbar region: Secondary | ICD-10-CM | POA: Diagnosis not present

## 2018-01-05 DIAGNOSIS — M545 Low back pain: Secondary | ICD-10-CM | POA: Diagnosis present

## 2018-01-05 DIAGNOSIS — M25562 Pain in left knee: Secondary | ICD-10-CM | POA: Insufficient documentation

## 2018-01-05 DIAGNOSIS — M79672 Pain in left foot: Secondary | ICD-10-CM | POA: Insufficient documentation

## 2018-01-05 DIAGNOSIS — M48061 Spinal stenosis, lumbar region without neurogenic claudication: Secondary | ICD-10-CM

## 2018-01-05 MED ORDER — DICLOFENAC SODIUM 75 MG PO TBEC: 75.0000 mg | DELAYED_RELEASE_TABLET | Freq: Two times a day (BID) | ORAL | 0 refills | Status: AC

## 2018-01-05 NOTE — Patient Instructions (Addendum)
1. UDS today 2. Referral to Pain Psych 3. Continue Cymbalta 4. Diclofenac 75 mg twice daily after meals for 3 weeks 5. Schedule for RIGHT L-ESI with sedation  Epidural Steroid Injection Patient Information  Description: The epidural space surrounds the nerves as they exit the spinal cord.  In some patients, the nerves can be compressed and inflamed by a bulging disc or a tight spinal canal (spinal stenosis).  By injecting steroids into the epidural space, we can bring irritated nerves into direct contact with a potentially helpful medication.  These steroids act directly on the irritated nerves and can reduce swelling and inflammation which often leads to decreased pain.  Epidural steroids may be injected anywhere along the spine and from the neck to the low back depending upon the location of your pain.   After numbing the skin with local anesthetic (like Novocaine), a small needle is passed into the epidural space slowly.  You may experience a sensation of pressure while this is being done.  The entire block usually last less than 10 minutes.  Conditions which may be treated by epidural steroids:   Low back and leg pain  Neck and arm pain  Spinal stenosis  Post-laminectomy syndrome  Herpes zoster (shingles) pain  Pain from compression fractures  Preparation for the injection:  1. Do not eat any solid food or dairy products within 8 hours of your appointment.  2. You may drink clear liquids up to 3 hours before appointment.  Clear liquids include water, black coffee, juice or soda.  No milk or cream please. 3. You may take your regular medication, including pain medications, with a sip of water before your appointment  Diabetics should hold regular insulin (if taken separately) and take 1/2 normal NPH dos the morning of the procedure.  Carry some sugar containing items with you to your appointment. 4. A driver must accompany you and be prepared to drive you home after your procedure.   5. Bring all your current medications with your. 6. An IV may be inserted and sedation may be given at the discretion of the physician.   7. A blood pressure cuff, EKG and other monitors will often be applied during the procedure.  Some patients may need to have extra oxygen administered for a short period. 8. You will be asked to provide medical information, including your allergies, prior to the procedure.  We must know immediately if you are taking blood thinners (like Coumadin/Warfarin)  Or if you are allergic to IV iodine contrast (dye). We must know if you could possible be pregnant.  Possible side-effects:  Bleeding from needle site  Infection (rare, may require surgery)  Nerve injury (rare)  Numbness & tingling (temporary)  Difficulty urinating (rare, temporary)  Spinal headache ( a headache worse with upright posture)  Light -headedness (temporary)  Pain at injection site (several days)  Decreased blood pressure (temporary)  Weakness in arm/leg (temporary)  Pressure sensation in back/neck (temporary)  Call if you experience:  Fever/chills associated with headache or increased back/neck pain.  Headache worsened by an upright position.  New onset weakness or numbness of an extremity below the injection site  Hives or difficulty breathing (go to the emergency room)  Inflammation or drainage at the infection site  Severe back/neck pain  Any new symptoms which are concerning to you  Please note:  Although the local anesthetic injected can often make your back or neck feel good for several hours after the injection, the pain will  likely return.  It takes 3-7 days for steroids to work in the epidural space.  You may not notice any pain relief for at least that one week.  If effective, we will often do a series of three injections spaced 3-6 weeks apart to maximally decrease your pain.  After the initial series, we generally will wait several months before  considering a repeat injection of the same type.  If you have any questions, please call [phone] [facility]

## 2018-01-05 NOTE — Progress Notes (Signed)
Safety precautions to be maintained throughout the outpatient stay will include: orient to surroundings, keep bed in low position, maintain call bell within reach at all times, provide assistance with transfer out of bed and ambulation.  

## 2018-01-05 NOTE — Progress Notes (Signed)
Patient's Name: Keith Carpenter  MRN: [id number]  Referring Provider: Marlowe Sax*  DOB: 1956-04-16  PCP: Birdie Sons, MD  DOS: 01/05/2018  Note by: Gillis Santa, MD  Service setting: Ambulatory outpatient  Specialty: Interventional Pain Management  Location: [facility] (AMB) Pain Management Facility  Visit type: Initial Patient Evaluation  Patient type: New Patient   Primary Reason(s) for Visit: Encounter for initial evaluation of one or more chronic problems (new to examiner) potentially causing chronic pain, and posing a threat to normal musculoskeletal function. (Level of risk: High) CC: Back Pain (low); Foot Pain (bilateral); and Knee Pain (bilateral)  HPI  Keith Carpenter is a 62 y.o. year old, male patient, who comes today to see [place] for the first time for an initial evaluation of his chronic pain. 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 Today he comes in for evaluation of his Back Pain (low); Foot Pain (bilateral); and Knee Pain (bilateral)  Pain Assessment: Location: Lower Back Radiating: knees, feet Onset: More than a month ago Duration: Chronic pain Quality: Constant, Other (Comment)(causes  weakness) Severity: 3 /10 (self-reported pain score)  Note: Reported level is compatible with observation.                         When using our objective Pain Scale, levels between 6 and 10/10 are said to belong in an emergency room, as it progressively worsens from a 6/10, described as severely limiting, requiring emergency care not usually available at an outpatient pain management facility. At a 6/10 level, communication becomes difficult and requires great effort. Assistance to reach the emergency department may be required. Facial flushing and profuse sweating along with potentially dangerous increases in heart rate and blood pressure will be evident. Effect on ADL:   Timing: Constant Modifying factors: medications, heat, TENS  Onset and Duration: Present longer than 3 months Cause of pain: Unknown Severity: Getting worse, NAS-11 at its worse: 9/10, NAS-11 at its best: 3/10 and NAS-11 on the average: 7/10 Timing: Night and After a period of immobility Aggravating Factors: Bending, Kneeling, Prolonged sitting, Prolonged standing and Walking Alleviating Factors: Cold packs, Hot packs, Medications and TENS Associated Problems: Numbness, Spasms, Sweating, Swelling, Temperature changes, Tingling and Weakness Quality of Pain: Aching, Constant and Sharp Previous Examinations or Tests: CT scan, MRI scan and X-rays Previous Treatments: Narcotic medications and TENS  The patient comes into the clinics today for the first time for a chronic pain management evaluation.   Pleasant 62 year old male male who presents with a chief complaint of joint pain.  Patient has a history of psoriatic arthritis diagnosed over 30 years ago.  He has seen [facility] in the past and was treated with Remicade which did help his joint pain however in October 2012 was diagnosed with Guyon Barr related to Remicade intake.  Of note patient has been on a fentanyl patch since 2003.  His previous dose was 50 mcg which  was  weaned down to 25 mcg this past January.  He states that the wean was difficult and that the months of January and February were challenging in regards to physiologic withdrawal symptoms.  He states that March was better and now is pleased that he is on 25 mcg but is motivated to continue to wean down/off.  Patient is also on Cymbalta 30 mg.  He was told not to take gabapentin in the past given his GI issues.  Patient also takes hydrocodone 5 mg twice daily as needed for breakthrough pain but has not taken this medication over the last 2 weeks.  In addition to poly arthralgias secondary to psoriatic arthritis, patient also has axial low back pain that radiates in a dermatomal fashion down the posterior portion of his thigh along his right lateral leg.  Patient has had MRI performed results of which are below.  Patient denies any bowel or bladder dysfunction.  Today I took the time to provide the patient with information regarding my pain practice. The patient was informed that my practice is divided into two sections: an interventional pain management section, as well as a completely separate and distinct medication management section. I explained that I have procedure days for my interventional therapies, and evaluation days for follow-ups and medication management. Because of the amount of documentation required during both, they are kept separated. This means that there is the possibility that he may be scheduled for a procedure on one day, and medication management the next. I have also informed him that because of staffing and facility limitations, I no longer take patients for medication management only. To illustrate the reasons for this, I gave the patient the example of surgeons, and how inappropriate it would be to refer a patient to his/her care, just to write for the post-surgical antibiotics on a surgery done by a different surgeon.   Because interventional pain management is my board-certified  specialty, the patient was informed that joining my practice means that they are open to any and all interventional therapies. I made it clear that this does not mean that they will be forced to have any procedures done. What this means is that I believe interventional therapies to be essential part of the diagnosis and proper management of chronic pain conditions. Therefore, patients not interested in these interventional alternatives will be better served under the care of a different practitioner.  The patient was also made aware of my Comprehensive Pain Management Safety Guidelines where by joining my practice, they limit all of their nerve blocks and joint injections to those done by our practice, for as long as we are retained to manage their care.   Historic Controlled Substance Pharmacotherapy Review  PMP and historical list of controlled substances: Fentanyl patch 25 mcg every 48 hours, hydrocodone 5 mg twice daily as needed breakthrough pain Highest opioid analgesic regimen found: Fentanyl patch 50 mcg every 48 hours MME/day: 90-100 mg/day Medications: The patient did not bring the medication(s) to the appointment, as requested in our "New Patient Package" Pharmacodynamics: Desired effects: Analgesia: The patient reports >50% benefit. Reported improvement in function: The patient reports medication allows him to accomplish basic ADLs. Clinically meaningful improvement in function (CMIF): Sustained CMIF goals met Perceived effectiveness: Described as relatively effective, allowing for increase in activities of daily living (ADL) Undesirable effects: Side-effects or Adverse reactions: None reported Historical Monitoring: The patient  reports that he does not use drugs. List of all UDS Test(s): Lab Results  Component Value Date   MDMA NONE DETECTED 08/24/2017   COCAINSCRNUR NONE DETECTED 08/24/2017   PCPSCRNUR NONE DETECTED 08/24/2017   THCU NONE DETECTED 08/24/2017   ETH <10  08/24/2017   List of other Serum/Urine Drug Screening Test(s):  Lab Results  Component Value Date   COCAINSCRNUR NONE DETECTED 08/24/2017   THCU NONE DETECTED 08/24/2017   ETH <10 08/24/2017   Historical Background Evaluation: [place] PMP: Six (6) year initial data search conducted.              Department of public safety, offender search: Editor, commissioning Information) Non-contributory Risk Assessment Profile: Aberrant behavior: None observed or detected today Risk factors for fatal opioid overdose: None identified today Fatal overdose hazard ratio (HR): Calculation deferred Non-fatal overdose hazard ratio (HR): Calculation deferred Risk of opioid abuse or dependence: 0.7-3.0% with doses ? 36 MME/day and 6.1-26% with doses ? 120 MME/day. Substance use disorder (SUD) risk level: Pending results of Medical Psychology Evaluation for SUD Opioid risk tool (ORT) (Total Score): 0 Opioid Risk Tool - 01/05/18 1216      Family History of Substance Abuse   Alcohol  Negative    Illegal Drugs  Negative    Rx Drugs  Negative      Personal History of Substance Abuse   Alcohol  Negative    Rx Drugs  Negative      Age   Age between 80-45 years   No      History of Preadolescent Sexual Abuse   History of Preadolescent Sexual Abuse  Negative or Male      Psychological Disease   Psychological Disease  Negative    Depression  Negative      Total Score   Opioid Risk Tool Scoring  0    Opioid Risk Interpretation  Low Risk      ORT Scoring interpretation table:  Score <3 = Low Risk for SUD  Score between 4-7 = Moderate Risk for SUD  Score >8 = High Risk for Opioid Abuse   PHQ-2 Depression Scale:  Total score: 0  PHQ-2 Scoring interpretation table: (Score and probability of major depressive disorder)  Score 0 = No depression  Score 1 = 15.4% Probability  Score 2 = 21.1% Probability  Score 3 = 38.4% Probability  Score 4 = 45.5% Probability  Score 5 = 56.4% Probability  Score 6 = 78.6%  Probability   PHQ-9 Depression Scale:  Total score: 0  PHQ-9 Scoring interpretation table:  Score 0-4 = No depression  Score 5-9 = Mild depression  Score 10-14 = Moderate depression  Score 15-19 = Moderately severe depression  Score 20-27 = Severe depression (2.4 times higher risk of SUD and 2.89 times higher risk of overuse)   Pharmacologic Plan: As per protocol, I have not taken over any controlled substance management, pending the results of ordered tests and/or consults.            Initial impression: Pending review of available data and ordered tests.  Meds   Current Outpatient Medications:  .  atorvastatin (LIPITOR) 10 MG tablet, Take 1 tablet (10 mg total) by mouth every evening. (Patient taking differently: Take 10 mg by mouth daily. ), Disp: 90 tablet, Rfl: 3 .  CUTIVATE 0.05 % LOTN, APPLY TO AFFECTED AREA TWICE A DAY AS NEEDED FOR RASH, Disp: 120 mL, Rfl: 5 .  DULoxetine (CYMBALTA) 30 MG capsule, Take 1 capsule (30 mg total) by mouth daily. Increase to 2 daily after 1 week if needed,  Disp: 30 capsule, Rfl: 2 .  fentaNYL (DURAGESIC - DOSED MCG/HR) 25 MCG/HR patch, Place 1 patch (25 mcg total) onto the skin every other day., Disp: 15 patch, Rfl: 0 .  HYDROcodone-acetaminophen (NORCO/VICODIN) 5-325 MG tablet, TAKE 1 TABLET BY MOUTH TWICE A DAY AS NEEDED FOR MODERATE PAIN (Patient taking differently: No sig reported), Disp: 60 tablet, Rfl: 0 .  levothyroxine (SYNTHROID, LEVOTHROID) 137 MCG tablet, Take 1 tablet (137 mcg total) by mouth daily., Disp: 90 tablet, Rfl: 3 .  metoprolol tartrate (LOPRESSOR) 50 MG tablet, TAKE ONE TABLET TWICE DAILY, Disp: 60 tablet, Rfl: 11 .  omeprazole (PRILOSEC) 20 MG capsule, Take 1 capsule (20 mg total) by mouth daily., Disp: 30 capsule, Rfl: 4 .  Vitamin D, Ergocalciferol, (DRISDOL) 50000 units CAPS capsule, Take 1 capsule (50,000 Units total) by mouth every 7 (seven) days., Disp: 12 capsule, Rfl: 4 .  zolpidem (AMBIEN) 10 MG tablet, Take 1-2 tablets  (10-20 mg total) by mouth at bedtime as needed. (Patient taking differently: Take 15 mg by mouth at bedtime as needed for sleep. ), Disp: 60 tablet, Rfl: 5 .  amLODipine (NORVASC) 5 MG tablet, TAKE ONE (1) TABLET EACH DAY (Patient not taking: Reported on 01/05/2018), Disp: 30 tablet, Rfl: 5 .  diclofenac (VOLTAREN) 75 MG EC tablet, Take 1 tablet (75 mg total) by mouth 2 (two) times daily for 21 days., Disp: 42 tablet, Rfl: 0  Imaging Review    Thoracic Imaging: Thoracic MR wo contrast:  Results for orders placed during the hospital encounter of 08/26/17  MR THORACIC SPINE WO CONTRAST   Narrative CLINICAL DATA:  Initial evaluation for acute back pain, shaking spells, syncope.  EXAM: MRI THORACIC AND LUMBAR SPINE WITHOUT CONTRAST  TECHNIQUE: Multiplanar and multiecho pulse sequences of the thoracic and lumbar spine were obtained without intravenous contrast.  COMPARISON:  None.  FINDINGS: MRI THORACIC SPINE FINDINGS  Alignment: Vertebral bodies normally aligned with preservation of the normal thoracic kyphosis. No listhesis.  Vertebrae: Vertebral body heights well maintained without evidence for acute or chronic fracture. Bone marrow signal intensity within normal limits. No discrete or worrisome osseous lesions. No abnormal marrow edema.  Cord: Short-segment flattening of the right posterior aspect of the thoracic spinal cord present at the level of T4 (series 7, image 12). Findings suggest a possible underlying lesion such is a subdural or intradural arachnoid cyst, not well delineated due to motion through this region. Similarly, an exact measurement is difficult to discern. Cord is mildly flattened at this level without cord signal changes. This is likely incidental in nature and of no clinical significance. Appearance of the thoracic spinal cord is otherwise normal. No cord signal changes or other abnormality.  Paraspinal and other soft tissues: Paraspinous soft tissues  within normal limits. Visualized lungs are clear. Visualized visceral structures are normal.  Disc levels:  T3-4: Small central disc protrusion indents the ventral thecal sac without significant stenosis.  T6-7: Tiny central disc protrusion minimally indents the ventral thecal sac without stenosis.  T9-10: Mild degenerative disc bulging without significant stenosis.  No other significant degenerative changes within the thoracic spine. No canal or foraminal stenosis.  MRI LUMBAR SPINE FINDINGS  Segmentation: Normal segmentation. Lowest well-formed disc labeled the L5-S1 level.  Alignment: Trace retrolisthesis of L2 on L3. Vertebral bodies otherwise normally aligned with preservation of the normal lumbar lordosis.  Vertebrae: Vertebral body heights are maintained without evidence for acute or chronic fracture. Bone marrow signal intensity within normal limits. Few small benign  hemangiomas noted. No worrisome osseous lesions.  Conus medullaris and cauda equina: Conus extends to the L1 level. Conus and cauda equina appear normal.  Paraspinal and other soft tissues: Paraspinous soft tissues within normal limits. Visualized visceral structures are normal.  Disc levels:  L1-2: Mild diffuse disc bulge with disc desiccation. No significant stenosis. No impingement.  L2-3: Trace retrolisthesis. Diffuse disc bulge with disc desiccation. Posterior annular fissure. Mild to moderate facet with ligamentum flavum hypertrophy. Superimposed 5 mm synovial cyst present at the anteromedial aspect of the left L2-3 facet (series 14, image 13). Resultant mild canal with mild to moderate bilateral lateral recess stenosis. Foramina remain patent.  L3-4: Mild diffuse disc bulge with disc desiccation. Mild to moderate facet and ligament flavum hypertrophy. Mild canal with mild to moderate bilateral subarticular stenosis. Borderline mild L3 foraminal narrowing.  L4-5: Mild diffuse disc bulge  with disc desiccation. Moderate facet arthrosis with ligamentum flavum hypertrophy. Mild reactive edema about the bilateral L4-5 facets. Moderate canal with moderate to severe bilateral subarticular stenosis. Mild left L4 foraminal narrowing.  L5-S1: Disc desiccation with superimposed small central disc protrusion. Protruding disc closely approximates the descending S1 nerve roots without frank neural impingement or displacement. No stenosis. Mild facet hypertrophy.  IMPRESSION: MR THORACIC SPINE IMPRESSION  1. No acute abnormality within the thoracic spine. 2. Small disc protrusion/disc bulging at C3-4, C6-7, and T9-10 without stenosis. 3. Short-segment flattening of the right posterior cord at the level of T4. Findings suggests the presence of a small subdural and/or intradural arachnoid cyst at this level, not well delineated on this exam due to motion. No associated cord signal changes or other abnormality. This finding is most likely incidental in nature, and of doubtful clinical significance.  MR LUMBAR SPINE IMPRESSION  1. No acute abnormality within the lumbar spine. 2. Degenerative disc bulging with facet arthrosis at L4-5 with resultant moderate canal with moderate to severe bilateral subarticular stenosis. 3. Small central disc protrusion at L5-S1, closely approximating the descending S1 nerve roots without frank neural impingement or displacement. 4. Multifactorial degenerative changes at L2-3 and L3-4 with resultant mild canal with mild to moderate subarticular stenosis.   Electronically Signed   By: Jeannine Boga M.D.   On: 08/27/2017 01:33    Lumbosacral Imaging: Lumbar MR wo contrast:  Results for orders placed during the hospital encounter of 08/26/17  MR LUMBAR SPINE WO CONTRAST   Narrative CLINICAL DATA:  Initial evaluation for acute back pain, shaking spells, syncope.  EXAM: MRI THORACIC AND LUMBAR SPINE WITHOUT  CONTRAST  TECHNIQUE: Multiplanar and multiecho pulse sequences of the thoracic and lumbar spine were obtained without intravenous contrast.  COMPARISON:  None.  FINDINGS: MRI THORACIC SPINE FINDINGS  Alignment: Vertebral bodies normally aligned with preservation of the normal thoracic kyphosis. No listhesis.  Vertebrae: Vertebral body heights well maintained without evidence for acute or chronic fracture. Bone marrow signal intensity within normal limits. No discrete or worrisome osseous lesions. No abnormal marrow edema.  Cord: Short-segment flattening of the right posterior aspect of the thoracic spinal cord present at the level of T4 (series 7, image 12). Findings suggest a possible underlying lesion such is a subdural or intradural arachnoid cyst, not well delineated due to motion through this region. Similarly, an exact measurement is difficult to discern. Cord is mildly flattened at this level without cord signal changes. This is likely incidental in nature and of no clinical significance. Appearance of the thoracic spinal cord is otherwise normal. No cord signal changes  or other abnormality.  Paraspinal and other soft tissues: Paraspinous soft tissues within normal limits. Visualized lungs are clear. Visualized visceral structures are normal.  Disc levels:  T3-4: Small central disc protrusion indents the ventral thecal sac without significant stenosis.  T6-7: Tiny central disc protrusion minimally indents the ventral thecal sac without stenosis.  T9-10: Mild degenerative disc bulging without significant stenosis.  No other significant degenerative changes within the thoracic spine. No canal or foraminal stenosis.  MRI LUMBAR SPINE FINDINGS  Segmentation: Normal segmentation. Lowest well-formed disc labeled the L5-S1 level.  Alignment: Trace retrolisthesis of L2 on L3. Vertebral bodies otherwise normally aligned with preservation of the normal  lumbar lordosis.  Vertebrae: Vertebral body heights are maintained without evidence for acute or chronic fracture. Bone marrow signal intensity within normal limits. Few small benign hemangiomas noted. No worrisome osseous lesions.  Conus medullaris and cauda equina: Conus extends to the L1 level. Conus and cauda equina appear normal.  Paraspinal and other soft tissues: Paraspinous soft tissues within normal limits. Visualized visceral structures are normal.  Disc levels:  L1-2: Mild diffuse disc bulge with disc desiccation. No significant stenosis. No impingement.  L2-3: Trace retrolisthesis. Diffuse disc bulge with disc desiccation. Posterior annular fissure. Mild to moderate facet with ligamentum flavum hypertrophy. Superimposed 5 mm synovial cyst present at the anteromedial aspect of the left L2-3 facet (series 14, image 13). Resultant mild canal with mild to moderate bilateral lateral recess stenosis. Foramina remain patent.  L3-4: Mild diffuse disc bulge with disc desiccation. Mild to moderate facet and ligament flavum hypertrophy. Mild canal with mild to moderate bilateral subarticular stenosis. Borderline mild L3 foraminal narrowing.  L4-5: Mild diffuse disc bulge with disc desiccation. Moderate facet arthrosis with ligamentum flavum hypertrophy. Mild reactive edema about the bilateral L4-5 facets. Moderate canal with moderate to severe bilateral subarticular stenosis. Mild left L4 foraminal narrowing.  L5-S1: Disc desiccation with superimposed small central disc protrusion. Protruding disc closely approximates the descending S1 nerve roots without frank neural impingement or displacement. No stenosis. Mild facet hypertrophy.  IMPRESSION: MR THORACIC SPINE IMPRESSION  1. No acute abnormality within the thoracic spine. 2. Small disc protrusion/disc bulging at C3-4, C6-7, and T9-10 without stenosis. 3. Short-segment flattening of the right posterior cord at the  level of T4. Findings suggests the presence of a small subdural and/or intradural arachnoid cyst at this level, not well delineated on this exam due to motion. No associated cord signal changes or other abnormality. This finding is most likely incidental in nature, and of doubtful clinical significance.  MR LUMBAR SPINE IMPRESSION  1. No acute abnormality within the lumbar spine. 2. Degenerative disc bulging with facet arthrosis at L4-5 with resultant moderate canal with moderate to severe bilateral subarticular stenosis. 3. Small central disc protrusion at L5-S1, closely approximating the descending S1 nerve roots without frank neural impingement or displacement. 4. Multifactorial degenerative changes at L2-3 and L3-4 with resultant mild canal with mild to moderate subarticular stenosis.   Electronically Signed   By: Jeannine Boga M.D.   On: 08/27/2017 01:33    Complexity Note: Imaging results reviewed. Results shared with Mr. Suski, using Layman's terms.                         ROS  Cardiovascular: Heart trouble and High blood pressure Pulmonary or Respiratory: No reported pulmonary signs or symptoms such as wheezing and difficulty taking a deep full breath (Asthma), difficulty blowing air out (Emphysema), coughing  up mucus (Bronchitis), persistent dry cough, or temporary stoppage of breathing during sleep Neurological: No reported neurological signs or symptoms such as seizures, abnormal skin sensations, urinary and/or fecal incontinence, being born with an abnormal open spine and/or a tethered spinal cord Review of Past Neurological Studies:  Results for orders placed or performed during the hospital encounter of 08/24/17  MR Brain Wo Contrast (neuro protocol)   Narrative   CLINICAL DATA:  Left-sided chest pain that began this morning. Left-sided shoulder and arm pain and tingling for the last few days.  EXAM: MRI HEAD WITHOUT CONTRAST  TECHNIQUE: Multiplanar,  multiecho pulse sequences of the brain and surrounding structures were obtained without intravenous contrast.  COMPARISON:  Head CT from earlier today.  Brain MRI 09/22/2005  FINDINGS: Brain: No acute infarction, hemorrhage, hydrocephalus, extra-axial collection or mass lesion. Asymmetric lateral ventricular volume, larger at the right lateral body, chronic and developmental appearing.  Vascular: Major flow voids are preserved  Skull and upper cervical spine: Negative for marrow lesion. C2-3 disc degeneration.  Sinuses/Orbits: No acute finding. Mild mucosal thickening in the ethmoids.  Other: Patient terminated the exam before axial T1 and coronal T2 weighted imaging could be obtained. Of the acquired sequences, the latter are motion degraded.  IMPRESSION: Negative partial and motion degraded exam. No infarct or other explanation for symptoms.   Electronically Signed   By: Monte Fantasia M.D.   On: 08/24/2017 12:43   CT Head Wo Contrast   Narrative   CLINICAL DATA:  Acute left headache, altered mental status, possible history of seizures  EXAM: CT HEAD WITHOUT CONTRAST  TECHNIQUE: Contiguous axial images were obtained from the base of the skull through the vertex without intravenous contrast.  COMPARISON:  02/12/2008  FINDINGS: Brain: No evidence of acute infarction, hemorrhage, hydrocephalus, extra-axial collection or mass lesion/mass effect.  Vascular: No hyperdense vessel or unexpected calcification.  Skull: Normal. Negative for fracture or focal lesion.  Sinuses/Orbits: No acute finding.  Other: None.  IMPRESSION: Normal head CT without contrast   Electronically Signed   By: Jerilynn Mages.  Shick M.D.   On: 08/24/2017 11:06    Psychological-Psychiatric: Difficulty sleeping and or falling asleep Gastrointestinal: Heartburn due to stomach pushing into lungs (Hiatal hernia) Genitourinary: No reported renal or genitourinary signs or symptoms such as  difficulty voiding or producing urine, peeing blood, non-functioning kidney, kidney stones, difficulty emptying the bladder, difficulty controlling the flow of urine, or chronic kidney disease Hematological: No reported hematological signs or symptoms such as prolonged bleeding, low or poor functioning platelets, bruising or bleeding easily, hereditary bleeding problems, low energy levels due to low hemoglobin or being anemic Endocrine: Slow thyroid Rheumatologic: No reported rheumatological signs and symptoms such as fatigue, joint pain, tenderness, swelling, redness, heat, stiffness, decreased range of motion, with or without associated rash Musculoskeletal: Negative for myasthenia gravis, muscular dystrophy, multiple sclerosis or malignant hyperthermia Work History: Retired  Allergies  Mr. Gerry is allergic to influenza vaccines; declomycin [demeclocycline]; demeclocycline hcl; ginger; glipizide; metformin and related; other; remicade [infliximab]; suvorexant; clarithromycin; and pioglitazone.  Laboratory Chemistry  Inflammation Markers (CRP: Acute Phase) (ESR: Chronic Phase) Lab Results  Component Value Date   ESRSEDRATE 13 09/29/2017   LATICACIDVEN 1.3 08/24/2017                         Rheumatology Markers No results found for: RF, ANA, LABURIC, URICUR, LYMEIGGIGMAB, LYMEABIGMQN  Renal Function Markers Lab Results  Component Value Date   BUN 14 09/29/2017   CREATININE 0.84 09/29/2017   GFRAA 111 09/29/2017   GFRNONAA 96 09/29/2017                              Hepatic Function Markers Lab Results  Component Value Date   AST 22 09/29/2017   ALT 24 09/29/2017   ALBUMIN 4.6 09/29/2017   ALKPHOS 79 09/29/2017   AMYLASE 29 07/20/2017   LIPASE 43 08/25/2017                        Electrolytes Lab Results  Component Value Date   NA 142 09/29/2017   K 4.7 09/29/2017   CL 100 09/29/2017   CALCIUM 9.1 09/29/2017                        Neuropathy  Markers Lab Results  Component Value Date   HGBA1C 6.5 08/06/2017                        Bone Pathology Markers Lab Results  Component Value Date   VD25OH 19.8 (L) 03/22/2017   TESTOFREE 3.3 (L) 03/29/2017   TESTOSTERONE 77 (L) 03/29/2017                         Coagulation Parameters Lab Results  Component Value Date   INR 0.89 08/24/2017   LABPROT 12.0 08/24/2017   PLT 302 09/29/2017                        Cardiovascular Markers Lab Results  Component Value Date   CKTOTAL 105 02/23/2012   CKMB 1.4 02/23/2012   TROPONINI <0.03 08/24/2017   HGB 14.7 09/29/2017   HCT 41.8 09/29/2017                         CA Markers No results found for: CEA, CA125, LABCA2                      Note: Lab results reviewed.  [place]  Drug: Keith Carpenter  reports that he does not use drugs. Alcohol:  reports that he does not drink alcohol. Tobacco:  reports that he has never smoked. He has never used smokeless tobacco. Medical:  has a past medical history of Collagen vascular disease ([place]), Diabetes mellitus without complication ([place]), Gastric ulcer, H/O Guillain-Barre syndrome, Helicobacter pylori gastritis (treated 07/2017), Hypertension, Hypocalcemia, Panic disorder, Schamberg disease, Thoracic aortic aneurysm ([place]) (04/2014), and Thyroid cancer ([place]) (2009). Family: family history includes Heart attack (age of onset: 38) in his father; Heart failure in his father; Hyperlipidemia in his father and mother; Hypertension in his father.  Past Surgical History:  Procedure Laterality Date  . CARDIAC CATHETERIZATION  06/07/2014   [facility]  . COLONOSCOPY    . MENISCUS REPAIR Right   . THYROIDECTOMY  2009   Active Ambulatory Problems    Diagnosis Date Noted  . Type 2 diabetes mellitus ([place]) 08/30/2007  . Hyperlipemia 08/30/2007  . RESTLESS LEGS SYNDROME 08/31/2007  . 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 Headache 08/30/2007  . Thoracic aortic aneurysm ([place])   . Essential hypertension   .  Chest pain 06/21/2014  . Psoriatic arthritis ([place]) 03/12/2015  .  Clinical depression 05/17/2015  . Acid reflux 11/26/2006  . Depression, neurotic 11/25/2006  . Aneurysm of ascending aorta ([facility]) 04/18/2014  . Colonic constipation 02/24/2008  . H/O disease 07/12/2015  . Disorder of male genital organ 07/08/2007  . Gastric ulcer 11/26/2006  . Calcium deficiency disease 07/12/2015  . Insomnia 08/17/2007  . Headache, migraine 11/26/2006  . Episodic paroxysmal anxiety disorder 02/22/2008  . Burning sensation of the foot 07/12/2015  . 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 Neurocardiogenic syncope 07/12/2015  . Vitamin D deficiency 07/15/2009  . Adynamia 04/18/2014  . Hypothyroidism 08/12/2015  . 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 Left testicular pain 08/18/2016  . Neuropathy 01/22/2017  . Weakness 08/26/2017  . Episode of shaking   . Lumbar herniated disc 11/16/2017  . Non-recurrent bilateral inguinal hernia without obstruction or gangrene 12/22/2017   Resolved Ambulatory Problems    Diagnosis Date Noted  . Essential hypertension 08/30/2007  . ARTHRITIS 08/30/2007  . Angina, class III ([place]) 06/04/2014  . Angina effort 06/04/2014  . AA (aortic aneurysm) ([place]) 07/12/2015  . Diabetes ([place]) 12/26/2006  . Hypercholesteremia 11/26/2006  . Blood glucose elevated 04/18/2014  . Deficiency of parathyrin ([place]) 02/22/2008  . Arthritis with psoriasis ([place]) 11/25/2006  . Current tobacco use 11/22/2006  . Exercise-induced angina ([place]) 06/04/2014  . Ascending aortic aneurysm ([place]) 04/18/2014   Past Medical History:  Diagnosis Date  . Collagen vascular disease ([place])   . Diabetes mellitus without complication ([place])   . Gastric ulcer   . H/O Guillain-Barre syndrome   . Helicobacter pylori gastritis treated 07/2017  . Hypertension   . Hypocalcemia   . Panic disorder   . Schamberg disease   . Thoracic aortic aneurysm ([place]) 04/2014  . Thyroid cancer ([place]) 2009    Constitutional Exam  General appearance: Well nourished, well developed, and well hydrated. In no apparent acute distress Vitals:   01/05/18 1207  BP: 140/78  Pulse: (!) 57  Resp: 18  Temp: 98.6 F (37 C)  TempSrc: Oral  SpO2: 97%  Weight: 215 lb (97.5 kg)  Height: '5\' 11"'$  (1.803 m)   BMI Assessment: Estimated body mass index is 29.99 kg/m as calculated from the following:   Height as of this encounter: '5\' 11"'$  (1.803 m).   Weight as of this encounter: 215 lb (97.5 kg).  BMI interpretation table: BMI level Category Range association with higher incidence of chronic pain  <18 kg/m2 Underweight   18.5-24.9 kg/m2 Ideal body weight   25-29.9 kg/m2 Overweight Increased incidence by 20%  30-34.9 kg/m2 Obese (Class I) Increased incidence by 68%  35-39.9 kg/m2 Severe obesity (Class II) Increased incidence by 136%  >40 kg/m2 Extreme obesity (Class III) Increased incidence by 254%   Patient's current BMI Ideal Body weight  Body mass index is 29.99 kg/m. Ideal body weight: 75.3 kg (166 lb 0.1 oz) Adjusted ideal body weight: 84.2 kg (185 lb 9.7 oz)   BMI Readings from Last 4 Encounters:  01/05/18 29.99 kg/m  12/21/17 30.13 kg/m  11/16/17 30.82 kg/m  10/11/17 29.74 kg/m   Wt Readings from Last 4 Encounters:  01/05/18 215 lb (97.5 kg)  12/21/17 216 lb (98 kg)  11/16/17 221 lb (100.2 kg)  10/11/17 213 lb 3.2 oz (96.7 kg)  Psych/Mental status: Alert, oriented x 3 (person, place, & time)       Eyes: PERLA Respiratory: No evidence of acute respiratory distress  Cervical Spine Area Exam  Skin & Axial Inspection: No masses, redness, edema, swelling, or  associated skin lesions Alignment: Symmetrical Functional ROM: Unrestricted ROM      Stability: No instability detected Muscle Tone/Strength: Functionally intact. No obvious neuro-muscular anomalies detected. Sensory (Neurological): Unimpaired Palpation: No palpable anomalies              Upper Extremity (UE) Exam    Side:  Right upper extremity  Side: Left upper extremity  Skin & Extremity Inspection: Skin color, temperature, and hair growth are WNL. No peripheral edema or cyanosis. No masses, redness, swelling, asymmetry, or associated skin lesions. No contractures.  Skin & Extremity Inspection: Skin color, temperature, and hair growth are WNL. No peripheral edema or cyanosis. No masses, redness, swelling, asymmetry, or associated skin lesions. No contractures.  Functional ROM: Unrestricted ROM          Functional ROM: Unrestricted ROM          Muscle Tone/Strength: Functionally intact. No obvious neuro-muscular anomalies detected.  Muscle Tone/Strength: Functionally intact. No obvious neuro-muscular anomalies detected.  Sensory (Neurological): Unimpaired          Sensory (Neurological): Unimpaired          Palpation: No palpable anomalies              Palpation: No palpable anomalies              Specialized Test(s): Deferred         Specialized Test(s): Deferred          Thoracic Spine Area Exam  Skin & Axial Inspection: No masses, redness, or swelling Alignment: Symmetrical Functional ROM: Unrestricted ROM Stability: No instability detected Muscle Tone/Strength: Functionally intact. No obvious neuro-muscular anomalies detected. Sensory (Neurological): Unimpaired Muscle strength & Tone: No palpable anomalies  Lumbar Spine Area Exam  Skin & Axial Inspection: No masses, redness, or swelling Alignment: Symmetrical Functional ROM: Decreased ROM       Stability: No instability detected Muscle Tone/Strength: Functionally intact. No obvious neuro-muscular anomalies detected. Sensory (Neurological): Dermatomal pain pattern Palpation: No palpable anomalies       Provocative Tests: Lumbar Hyperextension and rotation test: Positive on the right for facet joint pain. Lumbar Lateral bending test: Positive ipsilateral radicular pain, on the right. Positive for right-sided foraminal stenosis. Patrick's Maneuver:  evaluation deferred today                    Gait & Posture Assessment  Ambulation: Unassisted Gait: Relatively normal for age and body habitus Posture: WNL   Lower Extremity Exam    Side: Right lower extremity  Side: Left lower extremity  Skin & Extremity Inspection: Skin color, temperature, and hair growth are WNL. No peripheral edema or cyanosis. No masses, redness, swelling, asymmetry, or associated skin lesions. No contractures.  Skin & Extremity Inspection: Skin color, temperature, and hair growth are WNL. No peripheral edema or cyanosis. No masses, redness, swelling, asymmetry, or associated skin lesions. No contractures.  Functional ROM: Unrestricted ROM          Functional ROM: Unrestricted ROM          Muscle Tone/Strength: Functionally intact. No obvious neuro-muscular anomalies detected.  Muscle Tone/Strength: Functionally intact. No obvious neuro-muscular anomalies detected.  Sensory (Neurological): Unimpaired  Sensory (Neurological): Unimpaired  Palpation: No palpable anomalies  Palpation: No palpable anomalies   Assessment  Primary Diagnosis & Pertinent Problem List: The primary encounter diagnosis was Lumbar radiculopathy. Diagnoses of Neuroforaminal stenosis of lumbar spine, Lumbar degenerative disc disease, Psoriatic arthritis ([place]), Lumbar herniated disc, Neuropathy, and Chronic pain syndrome were  also pertinent to this visit.  Visit Diagnosis (New problems to examiner): 1. Lumbar radiculopathy   2. Neuroforaminal stenosis of lumbar spine   3. Lumbar degenerative disc disease   4. Psoriatic arthritis ([place])   5. Lumbar herniated disc   6. Neuropathy   7. Chronic pain syndrome    General Recommendations: The pain condition that the patient suffers from is best treated with a multidisciplinary approach that involves an increase in physical activity to prevent de-conditioning and worsening of the pain cycle, as well as psychological counseling (formal and/or informal) to  address the co-morbid psychological affects of pain. Treatment will often involve judicious use of pain medications and interventional procedures to decrease the pain, allowing the patient to participate in the physical activity that will ultimately produce long-lasting pain reductions. The goal of the multidisciplinary approach is to return the patient to a higher level of overall function and to restore their ability to perform activities of daily living.  62 year old male with a history of psoriatic arthritis (Diagnosed over 30 years ago) with a chief complaint of poly-arthralgias and myalgias along with right axial low back pain that radiates to his right lower extremity.  Patient has been on chronic opioid therapy for many years and in January had his fentanyl patch weaned from 50 mcg every 48 hours to 25 mcg every 48 hours.  He states that January and February were difficult in terms of his withdrawal symptoms but now he is in a better place and pleased with the wean.  He is motivated to continue to wean down further and even off of his fentanyl patch.  In regards to medication management, I discussed clinic policy with the patient when it comes to getting established here for chronic opioid therapy.  Patient was complete urine drug screen today.  I will also send the patient for pain psych evaluation regarding risk of substance abuse disorder which is standard for new patients.  We discussed a trial with NSAID therapy.  Patient's kidney function is within normal limits.  I recommended he try diclofenac 75 mg twice daily for 3 weeks for his arthralgias.  In regards to his lumbar radicular pain secondary to facet arthrosis at L4-L5 with moderate canal/moderate to severe bilateral subarticular stenosis, we discussed lumbar epidural steroid injection at L4-L5 towards the right to help with his symptoms of lumbar radiculopathy.  Risks and benefits were discussed.  Patient is not on any blood thinners.  Patient  would like to proceed.  Plan: -UDS today.  Should be positive for fentanyl.  Patient states that he has not taken hydrocodone over the last couple of weeks or so should be negative for hydrocodone and its metabolites. -Referral to pain psychology which is standard for new patients to evaluate for risk of substance abuse disorder -Continue Cymbalta 30 mg as prescribed -Diclofenac 75 mg twice daily x21 days to help with arthralgias -Scheduled for right L4/5 epidural steroid injection for right lumbar radiculopathy  Note: Please be advised that as per protocol, today's visit has been an evaluation only. We have not taken over the patient's controlled substance management.  Ordered Lab-work, Procedure(s), Referral(s), & Consult(s): Orders Placed This Encounter  Procedures  . Lumbar Epidural Injection  . Compliance Drug Analysis, Ur  . Ambulatory referral to Psychology   Pharmacotherapy (current): Medications ordered:  Meds ordered this encounter  Medications  . diclofenac (VOLTAREN) 75 MG EC tablet    Sig: Take 1 tablet (75 mg total) by mouth 2 (two) times  daily for 21 days.    Dispense:  42 tablet    Refill:  0   Medications administered during this visit: Amie [place] "Richardson Carpenter" had no medications administered during this visit.   Pharmacological management options:  Opioid Analgesics: The patient was informed that there is no guarantee that he would be a candidate for opioid analgesics. The decision will be made following CDC guidelines. This decision will be based on the results of diagnostic studies, as well as Keith Carpenter risk profile.   Membrane stabilizer: Avoid Gapabentin, can consider Lyrica  Muscle relaxant: To be determined at a later time  NSAID: Diclofenac today, Can consider NSAID trials with other agents in future  Other analgesic(s): To be determined at a later time   Interventional management options: Keith Carpenter was informed that there is no guarantee that he  would be a candidate for interventional therapies. The decision will be based on the results of diagnostic studies, as well as Keith Carpenter risk profile.  Procedure(s) under consideration:  L4/5 ESI Lumbar facet blocks   Provider-requested follow-up: Return in about 2 weeks (around 01/19/2018) for Procedure.  Future Appointments  Date Time Provider [place]  01/19/2018  2:15 PM Gillis Santa, MD [facility] None    Primary Care Physician: Birdie Sons, MD Location: [facility] Note by: Gillis Santa, M.D, Date: 01/05/2018; Time: 1:37 PM  Patient Instructions  1. UDS today 2. Referral to Pain Psych 3. Continue Cymbalta 4. Diclofenac 75 mg twice daily after meals for 3 weeks 5. Schedule for RIGHT L-ESI with sedation  Epidural Steroid Injection Patient Information  Description: The epidural space surrounds the nerves as they exit the spinal cord.  In some patients, the nerves can be compressed and inflamed by a bulging disc or a tight spinal canal (spinal stenosis).  By injecting steroids into the epidural space, we can bring irritated nerves into direct contact with a potentially helpful medication.  These steroids act directly on the irritated nerves and can reduce swelling and inflammation which often leads to decreased pain.  Epidural steroids may be injected anywhere along the spine and from the neck to the low back depending upon the location of your pain.   After numbing the skin with local anesthetic (like Novocaine), a small needle is passed into the epidural space slowly.  You may experience a sensation of pressure while this is being done.  The entire block usually last less than 10 minutes.  Conditions which may be treated by epidural steroids:   Low back and leg pain  Neck and arm pain  Spinal stenosis  Post-laminectomy syndrome  Herpes zoster (shingles) pain  Pain from compression fractures  Preparation for the  injection:  1. Do not eat any solid food or dairy products within 8 hours of your appointment.  2. You may drink clear liquids up to 3 hours before appointment.  Clear liquids include water, black coffee, juice or soda.  No milk or cream please. 3. You may take your regular medication, including pain medications, with a sip of water before your appointment  Diabetics should hold regular insulin (if taken separately) and take 1/2 normal NPH dos the morning of the procedure.  Carry some sugar containing items with you to your appointment. 4. A driver must accompany you and be prepared to drive you home after your procedure.  5. Bring all your current medications with your. 6. An IV may be inserted and sedation may be given at  the discretion of the physician.   7. A blood pressure cuff, EKG and other monitors will often be applied during the procedure.  Some patients may need to have extra oxygen administered for a short period. 8. You will be asked to provide medical information, including your allergies, prior to the procedure.  We must know immediately if you are taking blood thinners (like Coumadin/Warfarin)  Or if you are allergic to IV iodine contrast (dye). We must know if you could possible be pregnant.  Possible side-effects:  Bleeding from needle site  Infection (rare, may require surgery)  Nerve injury (rare)  Numbness & tingling (temporary)  Difficulty urinating (rare, temporary)  Spinal headache ( a headache worse with upright posture)  Light -headedness (temporary)  Pain at injection site (several days)  Decreased blood pressure (temporary)  Weakness in arm/leg (temporary)  Pressure sensation in back/neck (temporary)  Call if you experience:  Fever/chills associated with headache or increased back/neck pain.  Headache worsened by an upright position.  New onset weakness or numbness of an extremity below the injection site  Hives or difficulty breathing (go to the  emergency room)  Inflammation or drainage at the infection site  Severe back/neck pain  Any new symptoms which are concerning to you  Please note:  Although the local anesthetic injected can often make your back or neck feel good for several hours after the injection, the pain will likely return.  It takes 3-7 days for steroids to work in the epidural space.  You may not notice any pain relief for at least that one week.  If effective, we will often do a series of three injections spaced 3-6 weeks apart to maximally decrease your pain.  After the initial series, we generally will wait several months before considering a repeat injection of the same type.  If you have any questions, please call [phone] [facility]

## 2018-01-06 MED ORDER — FENTANYL 25 MCG/HR TD PT72
25.0000 ug | MEDICATED_PATCH | TRANSDERMAL | 0 refills | Status: DC
Start: 2018-01-06 — End: 2018-02-02

## 2018-01-06 NOTE — Telephone Encounter (Signed)
Patient sent mychart message requesting refills. Thanks!

## 2018-01-07 ENCOUNTER — Encounter: Payer: Self-pay | Admitting: Student in an Organized Health Care Education/Training Program

## 2018-01-10 LAB — COMPLIANCE DRUG ANALYSIS, UR

## 2018-01-19 ENCOUNTER — Ambulatory Visit: Payer: BC Managed Care – PPO | Admitting: Student in an Organized Health Care Education/Training Program

## 2018-02-02 ENCOUNTER — Other Ambulatory Visit: Payer: Self-pay | Admitting: Family Medicine

## 2018-02-02 DIAGNOSIS — L4050 Arthropathic psoriasis, unspecified: Secondary | ICD-10-CM

## 2018-02-03 MED ORDER — FENTANYL 25 MCG/HR TD PT72: 25.0000 ug | MEDICATED_PATCH | TRANSDERMAL | 0 refills | Status: DC

## 2018-02-04 ENCOUNTER — Other Ambulatory Visit: Payer: Self-pay | Admitting: Family Medicine

## 2018-02-04 DIAGNOSIS — G4700 Insomnia, unspecified: Secondary | ICD-10-CM

## 2018-02-10 ENCOUNTER — Other Ambulatory Visit: Payer: Self-pay | Admitting: Family Medicine

## 2018-02-10 DIAGNOSIS — F329 Major depressive disorder, single episode, unspecified: Secondary | ICD-10-CM

## 2018-02-10 DIAGNOSIS — F32A Depression, unspecified: Secondary | ICD-10-CM

## 2018-02-10 NOTE — Telephone Encounter (Signed)
Please check with patient to see if he is taking one capsule or two of the 30mg  duloxetine. If taking two then we can change to 60mg  capsules.

## 2018-02-21 NOTE — Telephone Encounter (Signed)
I called and spoke with patient. He says he is only taking one tablet Daily of the Cymbalta 30mg . He says that this dose seems to be working well for him.

## 2018-03-03 ENCOUNTER — Other Ambulatory Visit: Payer: Self-pay | Admitting: Family Medicine

## 2018-03-06 ENCOUNTER — Other Ambulatory Visit: Payer: Self-pay | Admitting: Family Medicine

## 2018-03-06 DIAGNOSIS — L4050 Arthropathic psoriasis, unspecified: Secondary | ICD-10-CM

## 2018-03-07 MED ORDER — FENTANYL 25 MCG/HR TD PT72: 25.0000 ug | MEDICATED_PATCH | TRANSDERMAL | 0 refills | Status: DC

## 2018-03-21 ENCOUNTER — Telehealth: Payer: Self-pay

## 2018-03-21 ENCOUNTER — Ambulatory Visit: Payer: BC Managed Care – PPO | Admitting: Family Medicine

## 2018-03-21 ENCOUNTER — Encounter: Payer: Self-pay | Admitting: Family Medicine

## 2018-03-21 VITALS — BP 102/66 | HR 58 | Temp 98.8°F | Wt 220.4 lb

## 2018-03-21 DIAGNOSIS — G4700 Insomnia, unspecified: Secondary | ICD-10-CM

## 2018-03-21 DIAGNOSIS — L02422 Furuncle of left axilla: Secondary | ICD-10-CM | POA: Diagnosis not present

## 2018-03-21 MED ORDER — ZOLPIDEM TARTRATE 10 MG PO TABS: 10.0000 mg | ORAL_TABLET | Freq: Every evening | ORAL | 4 refills | Status: DC | PRN

## 2018-03-21 MED ORDER — CEPHALEXIN 500 MG PO CAPS: 500.0000 mg | ORAL_CAPSULE | Freq: Three times a day (TID) | ORAL | 0 refills | Status: DC

## 2018-03-21 NOTE — Telephone Encounter (Signed)
The pharmacy sent in automated refill request for 35 tablets, apparently from an older prescription. Does he need a new prescription for 60 tablets?

## 2018-03-21 NOTE — Telephone Encounter (Signed)
Patient came in for a OV this morning. He states he picked up Zolpidem RX from pharmacy and the RX was wrote for #35 tablets. Patient states he normally gets #60 per month and reports taking 2 tablets every night. He wanted to know the reasoning for changing the quantity. Please advise. CB# [phone] [id number]

## 2018-03-21 NOTE — Addendum Note (Signed)
Addended by: Jules Schick on: 03/21/2018 04:15 PM   Modules accepted: Orders

## 2018-03-21 NOTE — Addendum Note (Signed)
Addended by: Jules Schick on: 03/21/2018 04:33 PM   Modules accepted: Orders

## 2018-03-21 NOTE — Telephone Encounter (Signed)
Spoke with pt, he does need a new RX for 60 tablets.   Thanks,   -Mickel Baas

## 2018-03-21 NOTE — Progress Notes (Signed)
Patient: Keith Carpenter Male    DOB: 06/20/1956   62 y.o.   MRN: [id number] Visit Date: 03/21/2018  Today's Provider: Vernie Murders, PA   Chief Complaint  Patient presents with  . Lump under left armpit area   Subjective:    HPI Patient presents today for a lump that appeared overnight under left armpit area. He states the area is painful, redness and swollen. He denies drainage.     Past Medical History:  Diagnosis Date  . Collagen vascular disease ([place])    RA diagnosed in his 4's  . Diabetes mellitus without complication ([place])   . Gastric ulcer   . H/O Guillain-Barre syndrome   . Helicobacter pylori gastritis treated 07/2017  . Hypertension   . Hypocalcemia   . Panic disorder   . Schamberg disease   . Thoracic aortic aneurysm ([place]) 04/2014   4.4 cm followed at [facility]  . Thyroid cancer ([place]) 2009   Past Surgical History:  Procedure Laterality Date  . CARDIAC CATHETERIZATION  06/07/2014   [facility]  . COLONOSCOPY    . MENISCUS REPAIR Right   . THYROIDECTOMY  2009   Family History  Problem Relation Age of Onset  . Hyperlipidemia Mother   . Heart attack Father 42  . Hypertension Father   . Hyperlipidemia Father   . Heart failure Father   . Prostate cancer Neg Hx   . Kidney cancer Neg Hx   . Bladder Cancer Neg Hx   . Kidney disease Neg Hx    Allergies  Allergen Reactions  . Influenza Vaccines Other (See Comments)    Guillain Barre  . Declomycin [Demeclocycline]   . Demeclocycline Hcl   . Ginger   . Glipizide     Nausea   . Metformin And Related Nausea Only    Metformin IR 500 BID  . Other     Other reaction(s): Other (See Comments) Uncoded Allergy. Allergen: Other Allergy: See Patient Chart for Details, Other Reaction: Other reaction  . Remicade [Infliximab]   . Suvorexant Other (See Comments)    vision changes  . Clarithromycin Rash  . Pioglitazone Itching, Swelling and Rash    Current Outpatient Medications:  .  amLODipine (NORVASC) 5 MG  tablet, TAKE ONE (1) TABLET EACH DAY, Disp: 30 tablet, Rfl: 5 .  atorvastatin (LIPITOR) 10 MG tablet, Take 1 tablet (10 mg total) by mouth every evening. (Patient taking differently: Take 10 mg by mouth daily. ), Disp: 90 tablet, Rfl: 3 .  CUTIVATE 0.05 % LOTN, APPLY TO AFFECTED AREA TWICE A DAY AS NEEDED FOR RASH, Disp: 120 mL, Rfl: 5 .  DULoxetine (CYMBALTA) 30 MG capsule, Take 1 capsule (30 mg total) by mouth daily., Disp: 30 capsule, Rfl: 5 .  fentaNYL (DURAGESIC - DOSED MCG/HR) 25 MCG/HR patch, Place 1 patch (25 mcg total) onto the skin every other day., Disp: 15 patch, Rfl: 0 .  HYDROcodone-acetaminophen (NORCO/VICODIN) 5-325 MG tablet, TAKE 1 TABLET BY MOUTH TWICE A DAY AS NEEDED FOR MODERATE PAIN (Patient taking differently: No sig reported), Disp: 60 tablet, Rfl: 0 .  levothyroxine (SYNTHROID, LEVOTHROID) 125 MCG tablet, , Disp: , Rfl: 3 .  metoprolol tartrate (LOPRESSOR) 50 MG tablet, TAKE ONE TABLET TWICE DAILY, Disp: 60 tablet, Rfl: 11 .  omeprazole (PRILOSEC) 20 MG capsule, Take 1 capsule (20 mg total) by mouth daily., Disp: 30 capsule, Rfl: 12 .  Vitamin D, Ergocalciferol, (DRISDOL) 50000 units CAPS capsule, Take 1 capsule (50,000 Units total) by  mouth every 7 (seven) days., Disp: 12 capsule, Rfl: 4 .  zolpidem (AMBIEN) 10 MG tablet, TAKE 1 TO 2 TABLETS BY MOUTH AT BEDTIME AS NEEDED, Disp: 35 tablet, Rfl: 4  Review of Systems  Constitutional: Negative.   Respiratory: Negative.   Cardiovascular: Negative.    Social History   Tobacco Use  . Smoking status: Never Smoker  . Smokeless tobacco: Never Used  Substance Use Topics  . Alcohol use: No   Objective:   BP 102/66 (BP Location: Right Arm, Patient Position: Sitting, Cuff Size: Normal)   Pulse (!) 58   Temp 98.8 F (37.1 C) (Oral)   Wt 220 lb 6.4 oz (100 kg)   SpO2 95%   BMI 30.74 kg/m   Physical Exam  Constitutional: He is oriented to person, place, and time. He appears well-developed and well-nourished. No distress.    HENT:  Head: Normocephalic and atraumatic.  Right Ear: Hearing normal.  Left Ear: Hearing normal.  Nose: Nose normal.  Eyes: Conjunctivae and lids are normal. Right eye exhibits no discharge. Left eye exhibits no discharge. No scleral icterus.  Pulmonary/Chest: Effort normal. No respiratory distress.  Musculoskeletal: Normal range of motion.  Neurological: He is alert and oriented to person, place, and time.  Skin: Skin is intact. No lesion and no rash noted. There is erythema.  Tender 1 cm red spot with slight induration left axilla. Central pustule. No drainage or lymphangitis.  Psychiatric: He has a normal mood and affect. His speech is normal and behavior is normal. Thought content normal.      Assessment & Plan:     1. Furuncle of left axilla Onset this morning. No fever or drainage from tender red spot in the left axillary fold. Treat with Keflex and obtained aerobic culture. May apply hot Epsom Saltwater compresses for 10-15 minutes BID. Recheck pending culture report. - cephALEXin (KEFLEX) 500 MG capsule; Take 1 capsule (500 mg total) by mouth 3 (three) times daily.  Dispense: 21 capsule; Refill: [place], [place]  [facility]

## 2018-03-23 ENCOUNTER — Ambulatory Visit: Payer: BC Managed Care – PPO | Admitting: Family Medicine

## 2018-03-24 ENCOUNTER — Telehealth: Payer: Self-pay

## 2018-03-24 ENCOUNTER — Other Ambulatory Visit: Payer: Self-pay | Admitting: Family Medicine

## 2018-03-24 DIAGNOSIS — E785 Hyperlipidemia, unspecified: Secondary | ICD-10-CM

## 2018-03-24 DIAGNOSIS — L02422 Furuncle of left axilla: Secondary | ICD-10-CM

## 2018-03-24 LAB — AEROBIC CULTURE

## 2018-03-24 MED ORDER — CLINDAMYCIN HCL 300 MG PO CAPS: 300.0000 mg | ORAL_CAPSULE | Freq: Three times a day (TID) | ORAL | 0 refills | Status: DC

## 2018-03-24 NOTE — Telephone Encounter (Signed)
See completed results and plan. Pt advised and FU made.

## 2018-03-24 NOTE — Telephone Encounter (Signed)
-----   Message from Margo Common, [place] sent at 03/24/2018  9:45 AM EDT ----- Preliminary culture report shows moderate growth. Lab continuing to incubate and try to isolate the specific pathogen. Continue present antibiotics.

## 2018-03-25 ENCOUNTER — Other Ambulatory Visit: Payer: Self-pay | Admitting: Family Medicine

## 2018-03-25 ENCOUNTER — Telehealth: Payer: Self-pay

## 2018-03-25 ENCOUNTER — Encounter: Payer: Self-pay | Admitting: Family Medicine

## 2018-03-25 NOTE — Progress Notes (Signed)
Aerobic culture of axillary furuncle identified MRSA as the pathogen. It is sensitive to Cipro, Levaquin, Cleocin, Bactrim and Doxycycline. Patient allergic to tetracyclines and with his history of C.Diff infection earlier this year, he is anxious about use of Cleocin and did not get that prescription filled. Discussed with pharmacist and patient, then decided to try the Bactrim-DS BID #20 and keep the follow up appointment on 04-01-18. If he has recurrence of C.diff symptoms, will need to consider referral to ID specialist.

## 2018-03-25 NOTE — Telephone Encounter (Signed)
Jody with [facility] called requesting to talk to [facility] regarding a medication for patient.

## 2018-03-31 ENCOUNTER — Ambulatory Visit: Payer: BC Managed Care – PPO | Admitting: Psychology

## 2018-04-01 ENCOUNTER — Ambulatory Visit: Payer: Self-pay | Admitting: Family Medicine

## 2018-04-06 NOTE — Progress Notes (Signed)
Patient: Keith Carpenter Male    DOB: Feb 03, 1956   62 y.o.   MRN: [id number] Visit Date: 04/07/2018  Today's Provider: Lelon Huh, MD   Chief Complaint  Patient presents with  . Follow-up   Subjective:    HPI  Furuncle of left axilla From 03/21/2018-saw Simona Huh Chrismon for folliculitis left axilla and prescribed  KEFLEX 500 MG capsule. Cultures were positive for MRSA and initially prescribed, clindamycine which was not filled due to history of c.diff, but changed to Septra instead.  Advised he may apply hot Epsom Saltwater compresses for 10-15 minutes BID.   Patient states sore has improved greatly, but not completely healed. Is not painful at all, still slightly swollen.    Is also due for follow up for diabetes. Feels well. Compliant with diabetic diet. On no meds, but states sugar was very high when checked at Dr. Nancie Neas office recently.   Lab Results  Component Value Date   HGBA1C 6.5 08/06/2017     Allergies  Allergen Reactions  . Influenza Vaccines Other (See Comments)    Guillain Barre  . Declomycin [Demeclocycline]   . Demeclocycline Hcl   . Ginger   . Glipizide     Nausea   . Metformin And Related Nausea Only    Metformin IR 500 BID  . Other     Other reaction(s): Other (See Comments) Uncoded Allergy. Allergen: Other Allergy: See Patient Chart for Details, Other Reaction: Other reaction  . Remicade [Infliximab]   . Suvorexant Other (See Comments)    vision changes  . Clarithromycin Rash  . Pioglitazone Itching, Swelling and Rash     Current Outpatient Medications:  .  amLODipine (NORVASC) 5 MG tablet, TAKE ONE (1) TABLET EACH DAY, Disp: 30 tablet, Rfl: 5 .  atorvastatin (LIPITOR) 10 MG tablet, Take 1 tablet (10 mg total) by mouth every evening., Disp: 90 tablet, Rfl: 4 .  CUTIVATE 0.05 % LOTN, APPLY TO AFFECTED AREA TWICE A DAY AS NEEDED FOR RASH, Disp: 120 mL, Rfl: 5 .  DULoxetine (CYMBALTA) 30 MG capsule, Take 1 capsule (30 mg total)  by mouth daily., Disp: 30 capsule, Rfl: 5 .  fentaNYL (DURAGESIC - DOSED MCG/HR) 25 MCG/HR patch, Place 1 patch (25 mcg total) onto the skin every other day., Disp: 15 patch, Rfl: 0 .  HYDROcodone-acetaminophen (NORCO/VICODIN) 5-325 MG tablet, TAKE 1 TABLET BY MOUTH TWICE A DAY AS NEEDED FOR MODERATE PAIN (Patient taking differently: No sig reported), Disp: 60 tablet, Rfl: 0 .  levothyroxine (SYNTHROID, LEVOTHROID) 125 MCG tablet, , Disp: , Rfl: 3 .  metoprolol tartrate (LOPRESSOR) 50 MG tablet, TAKE ONE TABLET TWICE DAILY, Disp: 60 tablet, Rfl: 11 .  omeprazole (PRILOSEC) 20 MG capsule, Take 1 capsule (20 mg total) by mouth daily., Disp: 30 capsule, Rfl: 12 .  Vitamin D, Ergocalciferol, (DRISDOL) 50000 units CAPS capsule, Take 1 capsule (50,000 Units total) by mouth every 7 (seven) days., Disp: 12 capsule, Rfl: 4 .  zolpidem (AMBIEN) 10 MG tablet, Take 1-2 tablets (10-20 mg total) by mouth at bedtime as needed., Disp: 60 tablet, Rfl: 4  Review of Systems  Constitutional: Negative for appetite change, chills and fever.  Respiratory: Negative for chest tightness, shortness of breath and wheezing.   Cardiovascular: Negative for chest pain and palpitations.  Gastrointestinal: Negative for abdominal pain, nausea and vomiting.    Social History   Tobacco Use  . Smoking status: Never Smoker  . Smokeless tobacco: Never Used  Substance  Use Topics  . Alcohol use: No   Objective:   BP 104/66 (BP Location: Right Arm, Patient Position: Sitting, Cuff Size: Large)   Pulse (!) 56   Temp 98.3 F (36.8 C) (Oral)   Resp 16   Ht 5\' 11"  (1.803 m)   Wt 219 lb (99.3 kg)   SpO2 94%   BMI 30.54 kg/m  Vitals:   04/07/18 1104  BP: 104/66  Pulse: (!) 56  Resp: 16  Temp: 98.3 F (36.8 C)  TempSrc: Oral  SpO2: 94%  Weight: 219 lb (99.3 kg)  Height: 5\' 11"  (1.803 m)     Physical Exam  General appearance: alert, well developed, well nourished, cooperative and in no distress Head: Normocephalic,  without obvious abnormality, atraumatic Respiratory: Respirations even and unlabored, normal respiratory rate Extremities: about dime size area of erythema under left axilla, not tender. Minimal swelling, no discharge.   Results for orders placed or performed in visit on 04/07/18  POCT glycosylated hemoglobin (Hb A1C)  Result Value Ref Range   Hemoglobin A1C 6.7 (A) 4.0 - 5.6 %   HbA1c POC (<> result, manual entry)  4.0 - 5.6 %   HbA1c, POC (prediabetic range)  5.7 - 6.4 %   HbA1c, POC (controlled diabetic range)  0.0 - 7.0 %        Assessment & Plan:     1. Type 2 diabetes mellitus without complication, without long-term current use of insulin (HCC) Well controlled. . Continue current plan of care.  - POCT glycosylated hemoglobin (Hb A1C)  2. Furuncle of left axilla Greatly improved, although not completely resolved.  - sulfamethoxazole-trimethoprim (BACTRIM DS,SEPTRA DS) 800-160 MG tablet; Take 1 tablet by mouth 2 (two) times daily for 7 days.  Dispense: 14 tablet; Refill: 0  3. MRSA cellulitis Recommend regular use of Hibaclens liquid soap.        Lelon Huh, MD  [facility]

## 2018-04-07 ENCOUNTER — Encounter: Payer: Self-pay | Admitting: Family Medicine

## 2018-04-07 ENCOUNTER — Ambulatory Visit: Payer: Self-pay | Admitting: Family Medicine

## 2018-04-07 ENCOUNTER — Ambulatory Visit: Payer: BC Managed Care – PPO | Admitting: Family Medicine

## 2018-04-07 VITALS — BP 104/66 | HR 56 | Temp 98.3°F | Resp 16 | Ht 71.0 in | Wt 219.0 lb

## 2018-04-07 DIAGNOSIS — B9562 Methicillin resistant Staphylococcus aureus infection as the cause of diseases classified elsewhere: Secondary | ICD-10-CM

## 2018-04-07 DIAGNOSIS — L0390 Cellulitis, unspecified: Secondary | ICD-10-CM | POA: Diagnosis not present

## 2018-04-07 DIAGNOSIS — E119 Type 2 diabetes mellitus without complications: Secondary | ICD-10-CM | POA: Diagnosis not present

## 2018-04-07 DIAGNOSIS — L02422 Furuncle of left axilla: Secondary | ICD-10-CM | POA: Diagnosis not present

## 2018-04-07 DIAGNOSIS — Z8614 Personal history of Methicillin resistant Staphylococcus aureus infection: Secondary | ICD-10-CM | POA: Insufficient documentation

## 2018-04-07 LAB — POCT GLYCOSYLATED HEMOGLOBIN (HGB A1C): Hemoglobin A1C: 6.7 % — AB (ref 4.0–5.6)

## 2018-04-07 MED ORDER — SULFAMETHOXAZOLE-TRIMETHOPRIM 800-160 MG PO TABS: 1.0000 | ORAL_TABLET | Freq: Two times a day (BID) | ORAL | 0 refills | Status: AC

## 2018-04-07 NOTE — Patient Instructions (Addendum)
   Use Hibiclens liquid soap from head to toe about three days every week to reduce risk of MRSA infection

## 2018-04-12 ENCOUNTER — Other Ambulatory Visit: Payer: Self-pay | Admitting: Family Medicine

## 2018-04-12 DIAGNOSIS — L4050 Arthropathic psoriasis, unspecified: Secondary | ICD-10-CM

## 2018-04-12 MED ORDER — HYDROCODONE-ACETAMINOPHEN 5-325 MG PO TABS: 1.0000 | ORAL_TABLET | Freq: Two times a day (BID) | ORAL | 0 refills | Status: DC | PRN

## 2018-04-12 MED ORDER — FENTANYL 25 MCG/HR TD PT72: 25.0000 ug | MEDICATED_PATCH | TRANSDERMAL | 0 refills | Status: DC

## 2018-04-14 ENCOUNTER — Encounter: Payer: BC Managed Care – PPO | Admitting: Psychology

## 2018-05-09 ENCOUNTER — Other Ambulatory Visit: Payer: Self-pay | Admitting: Family Medicine

## 2018-05-09 DIAGNOSIS — L4050 Arthropathic psoriasis, unspecified: Secondary | ICD-10-CM

## 2018-05-10 MED ORDER — FENTANYL 25 MCG/HR TD PT72
25.0000 ug | MEDICATED_PATCH | TRANSDERMAL | 0 refills | Status: DC
Start: 2018-05-10 — End: 2018-06-13

## 2018-05-19 ENCOUNTER — Other Ambulatory Visit: Payer: Self-pay | Admitting: Family Medicine

## 2018-05-19 DIAGNOSIS — I10 Essential (primary) hypertension: Secondary | ICD-10-CM

## 2018-06-13 ENCOUNTER — Other Ambulatory Visit: Payer: Self-pay | Admitting: Family Medicine

## 2018-06-13 DIAGNOSIS — L4050 Arthropathic psoriasis, unspecified: Secondary | ICD-10-CM

## 2018-06-14 MED ORDER — FENTANYL 25 MCG/HR TD PT72: 25.0000 ug | MEDICATED_PATCH | TRANSDERMAL | 0 refills | Status: DC

## 2018-06-23 ENCOUNTER — Ambulatory Visit: Payer: Self-pay | Admitting: General Surgery

## 2018-07-10 ENCOUNTER — Other Ambulatory Visit: Payer: Self-pay | Admitting: Family Medicine

## 2018-07-10 DIAGNOSIS — L4050 Arthropathic psoriasis, unspecified: Secondary | ICD-10-CM

## 2018-07-11 MED ORDER — FENTANYL 25 MCG/HR TD PT72: 25.0000 ug | MEDICATED_PATCH | TRANSDERMAL | 0 refills | Status: DC

## 2018-07-11 NOTE — Telephone Encounter (Signed)
Dr Caryn Section, is this your patient

## 2018-07-21 ENCOUNTER — Ambulatory Visit: Payer: Self-pay | Admitting: General Surgery

## 2018-08-02 ENCOUNTER — Ambulatory Visit: Payer: BC Managed Care – PPO | Admitting: General Surgery

## 2018-08-02 ENCOUNTER — Encounter: Payer: Self-pay | Admitting: General Surgery

## 2018-08-02 ENCOUNTER — Other Ambulatory Visit: Payer: Self-pay

## 2018-08-02 VITALS — BP 149/77 | HR 60 | Temp 97.9°F | Resp 16 | Wt 220.8 lb

## 2018-08-02 DIAGNOSIS — K4020 Bilateral inguinal hernia, without obstruction or gangrene, not specified as recurrent: Secondary | ICD-10-CM

## 2018-08-02 DIAGNOSIS — R1011 Right upper quadrant pain: Secondary | ICD-10-CM | POA: Diagnosis not present

## 2018-08-02 DIAGNOSIS — R1010 Upper abdominal pain, unspecified: Secondary | ICD-10-CM

## 2018-08-02 NOTE — Patient Instructions (Addendum)
  Recommend abdominal ultrasound to evaluate gallbladder, HIDA scan if necessary  Inguinal Hernia, Adult An inguinal hernia is when fat or the intestines push through the area where the leg meets the lower belly (groin) and make a rounded lump (bulge). This condition happens over time. There are three types of inguinal hernias. These types include:  Hernias that can be pushed back into the belly (are reducible).  Hernias that cannot be pushed back into the belly (are incarcerated).  Hernias that cannot be pushed back into the belly and lose their blood supply (get strangulated). This type needs emergency surgery.  Follow these instructions at home: Lifestyle  Drink enough fluid to keep your urine (pee) clear or pale yellow.  Eat plenty of fruits, vegetables, and whole grains. These have a lot of fiber. Talk with your doctor if you have questions.  Avoid lifting heavy objects.  Avoid standing for long periods of time.  Do not use tobacco products. These include cigarettes, chewing tobacco, or e-cigarettes. If you need help quitting, ask your doctor.  Try to stay at a healthy weight. General instructions  Do not try to force the hernia back in.  Watch your hernia for any changes in color or size. Let your doctor know if there are any changes.  Take over-the-counter and prescription medicines only as told by your doctor.  Keep all follow-up visits as told by your doctor. This is important. Contact a doctor if:  You have a fever.  You have new symptoms.  Your symptoms get worse. Get help right away if:  The area where the legs meets the lower belly has: ? Pain that gets worse suddenly. ? A bulge that gets bigger suddenly and does not go down. ? A bulge that turns red or purple. ? A bulge that is painful to the touch.  You are a man and your scrotum: ? Suddenly feels painful. ? Suddenly changes in size.  You feel sick to your stomach (nauseous) and this feeling does  not go away.  You throw up (vomit) and this keeps happening.  You feel your heart beating a lot more quickly than normal.  You cannot poop (have a bowel movement) or pass gas. This information is not intended to replace advice given to you by your health care provider. Make sure you discuss any questions you have with your health care provider. Document Released: 10/01/2006 Document Revised: 02/06/2016 Document Reviewed: 07/11/2014 Elsevier Interactive Patient Education  [employer].   The patient is scheduled for an abdominal ultrasound with HIDA scan to follow if negative at [facility] on 08/24/18 at 8:30 am. He will arrive by 8:15 am and have nothing to eat or drink for 6 hours prior. The patient is aware of date, time, and instructions.

## 2018-08-02 NOTE — Progress Notes (Signed)
Patient ID: Keith Carpenter, male   DOB: May 11, 1956, 62 y.o.   MRN: [id number]  Chief Complaint  Patient presents with  . Hernia    HPI Keith Carpenter is a 62 y.o. male.  Follow up and discuss surgery for inguinal hernias. He states he still has central upper abdominal pain and pressure when he eats, gas x helps. He states nuts, lettuce, fried foods and dairy are the worst. The discomfort is worse after evening meal.Denies pain in groin areas.  HPI  Past Medical History:  Diagnosis Date  . Collagen vascular disease ([place])    RA diagnosed in his 66's  . Diabetes mellitus without complication ([place])   . Gastric ulcer   . H/O Guillain-Barre syndrome   . Helicobacter pylori gastritis treated 07/2017  . Hypertension   . Hypocalcemia   . Panic disorder   . Schamberg disease   . Thoracic aortic aneurysm ([place]) 04/2014   4.4 cm [facility] at [facility]  . Thyroid cancer ([place]) 2009    Past Surgical History:  Procedure Laterality Date  . CARDIAC CATHETERIZATION  06/07/2014   [facility]  . COLONOSCOPY    . MENISCUS REPAIR Right   . THYROIDECTOMY  2009    Family History  Problem Relation Age of Onset  . Hyperlipidemia Mother   . Heart attack Father 71  . Hypertension Father   . Hyperlipidemia Father   . Heart failure Father   . Prostate cancer Neg Hx   . Kidney cancer Neg Hx   . Bladder Cancer Neg Hx   . Kidney disease Neg Hx     Social History Social History   Tobacco Use  . Smoking status: Never Smoker  . Smokeless tobacco: Never Used  Substance Use Topics  . Alcohol use: No  . Drug use: No    Allergies  Allergen Reactions  . Influenza Vaccines Other (See Comments)    Guillain Barre  . Declomycin [Demeclocycline]   . Demeclocycline Hcl   . Ginger   . Glipizide     Nausea   . Metformin And Related Nausea Only    Metformin IR 500 BID  . Other     Other reaction(s): Other (See Comments) Uncoded Allergy. Allergen: Other Allergy: See Patient Chart for Details,  Other Reaction: Other reaction  . Remicade [Infliximab]   . Suvorexant Other (See Comments)    vision changes  . Clarithromycin Rash  . Pioglitazone Itching, Swelling and Rash    Current Outpatient Medications  Medication Sig Dispense Refill  . amLODipine (NORVASC) 5 MG tablet TAKE ONE (1) TABLET EACH DAY 30 tablet 12  . atorvastatin (LIPITOR) 10 MG tablet Take 1 tablet (10 mg total) by mouth every evening. 90 tablet 4  . CUTIVATE 0.05 % LOTN APPLY TO AFFECTED AREA TWICE A DAY AS NEEDED FOR RASH 120 mL 5  . DULoxetine (CYMBALTA) 30 MG capsule Take 1 capsule (30 mg total) by mouth daily. 30 capsule 5  . fentaNYL (DURAGESIC - DOSED MCG/HR) 25 MCG/HR patch Place 1 patch (25 mcg total) onto the skin every other day. 15 patch 0  . HYDROcodone-acetaminophen (NORCO/VICODIN) 5-325 MG tablet Take 1 tablet by mouth 2 (two) times daily as needed for moderate pain. 60 tablet 0  . levothyroxine (SYNTHROID, LEVOTHROID) 125 MCG tablet   3  . metoprolol tartrate (LOPRESSOR) 50 MG tablet TAKE ONE TABLET TWICE DAILY 60 tablet 11  . omeprazole (PRILOSEC) 20 MG capsule Take 1 capsule (20 mg total) by mouth daily. [place]  capsule 12  . simethicone (MYLICON) 660 MG chewable tablet Chew 125 mg by mouth every 6 (six) hours as needed for flatulence.    Marland Kitchen TALTZ 80 MG/ML SOAJ     . triamcinolone cream (KENALOG) 0.1 %   2  . zolpidem (AMBIEN) 10 MG tablet Take 1-2 tablets (10-20 mg total) by mouth at bedtime as needed. 60 tablet 4   No current facility-administered medications for this visit.     Review of Systems Review of Systems  Constitutional: Negative.   Respiratory: Negative.   Cardiovascular: Negative.   Gastrointestinal: Positive for abdominal pain. Negative for constipation and diarrhea.    Blood pressure (!) 149/77, pulse 60, temperature 97.9 F (36.6 C), temperature source Skin, resp. rate 16, weight 220 lb 12.8 oz (100.2 kg), SpO2 95 %.  Physical Exam Physical Exam  Constitutional: He is oriented  to person, place, and time. He appears well-developed and well-nourished.  HENT:  Mouth/Throat: No oropharyngeal exudate.  Eyes: Conjunctivae are normal. No scleral icterus.  Neck: Neck supple.  Cardiovascular: Normal rate, regular rhythm and normal heart sounds.  Pulmonary/Chest: Effort normal and breath sounds normal.  Abdominal: Soft. Normal appearance and bowel sounds are normal. A hernia is present. Hernia confirmed positive in the right inguinal area and confirmed positive in the left inguinal area.    Left inguinal hernia > right inguinal hernia   Neurological: He is alert and oriented to person, place, and time.  Skin: Skin is warm and dry.  Psychiatric: His behavior is normal.    Data Reviewed CT of the abdomen and pelvis dated October 19, 2017 had previously been reviewed.    IMPRESSION: Resolution of diverticulitis changes seen on previous exam.  BILATERAL inguinal hernias containing fat, with stranding identified within the fat of the LEFT inguinal hernia sac which could reflect inflammation or incarceration, recommend correlation with physical exam; could this potentially represents source of patient's symptoms?  Mild prostatic enlargement especially central lobe indenting bladder Base.   Assessment    Right upper quadrant and epigastric pain especially in the postprandial.,  Aggravated by fatty foods.  Past history gastric ulcer.  Possible biliary source for abdominal symptoms.    Plan     We will arrange for an abdominal ultrasound to evaluate abdominal pain, HIDA scan if necessary   Regarding repair of his hernias, at this time the patient is not particularly interested as he has not appreciated any discomfort whatsoever and from his point of view there were incidental findings.  We did discuss the natural history of hernias are to enlarge over time and that he might consider having this done.  Hernia precautions and incarceration were discussed with  the patient. If they develop symptoms of an incarcerated hernia, they were encouraged to seek prompt medical attention.  The opportunity for laparoscopic repair by another physician or unilateral versus bilateral open repair with myself were reviewed.  Pros and cons of each were discussed.  The role of prosthetic mesh for repair was reviewed.      HPI, Physical Exam, Assessment and Plan have been scribed under the direction and in the presence of Daison Bellow, MD. Karie Fetch, RN  The patient is scheduled for an abdominal ultrasound with HIDA scan to follow if negative at [facility] on 08/24/18 at 8:30 am. He will arrive by 8:15 am and have nothing to eat or drink for 6 hours prior. The patient is aware of date, time, and instructions.  Documented by Lesly Rubenstein  LPN   I have completed the exam and reviewed the above documentation for accuracy and completeness.  I agree with the above.  Haematologist has been used and any errors in dictation or transcription are unintentional.  Hervey Ard, M.D., F.A.C.S.  Forest Gleason Flora Ratz 08/03/2018, 5:54 PM

## 2018-08-03 DIAGNOSIS — R1011 Right upper quadrant pain: Secondary | ICD-10-CM | POA: Insufficient documentation

## 2018-08-07 ENCOUNTER — Other Ambulatory Visit: Payer: Self-pay | Admitting: Family Medicine

## 2018-08-07 DIAGNOSIS — L4050 Arthropathic psoriasis, unspecified: Secondary | ICD-10-CM

## 2018-08-08 MED ORDER — FENTANYL 25 MCG/HR TD PT72: 25.0000 ug | MEDICATED_PATCH | TRANSDERMAL | 0 refills | Status: DC

## 2018-08-15 ENCOUNTER — Ambulatory Visit: Payer: Self-pay | Admitting: Family Medicine

## 2018-08-19 ENCOUNTER — Ambulatory Visit: Payer: BC Managed Care – PPO | Admitting: Family Medicine

## 2018-08-19 ENCOUNTER — Encounter: Payer: Self-pay | Admitting: Family Medicine

## 2018-08-19 VITALS — BP 132/60 | HR 58 | Temp 98.7°F | Resp 16 | Ht 71.0 in | Wt 225.0 lb

## 2018-08-19 DIAGNOSIS — I712 Thoracic aortic aneurysm, without rupture, unspecified: Secondary | ICD-10-CM

## 2018-08-19 DIAGNOSIS — I440 Atrioventricular block, first degree: Secondary | ICD-10-CM | POA: Insufficient documentation

## 2018-08-19 DIAGNOSIS — L4050 Arthropathic psoriasis, unspecified: Secondary | ICD-10-CM

## 2018-08-19 DIAGNOSIS — F329 Major depressive disorder, single episode, unspecified: Secondary | ICD-10-CM | POA: Diagnosis not present

## 2018-08-19 DIAGNOSIS — M5126 Other intervertebral disc displacement, lumbar region: Secondary | ICD-10-CM

## 2018-08-19 DIAGNOSIS — I10 Essential (primary) hypertension: Secondary | ICD-10-CM | POA: Diagnosis not present

## 2018-08-19 DIAGNOSIS — F32A Depression, unspecified: Secondary | ICD-10-CM

## 2018-08-19 DIAGNOSIS — E119 Type 2 diabetes mellitus without complications: Secondary | ICD-10-CM

## 2018-08-19 LAB — POCT GLYCOSYLATED HEMOGLOBIN (HGB A1C)
ESTIMATED AVERAGE GLUCOSE: 148
HEMOGLOBIN A1C: 6.8 % — AB (ref 4.0–5.6)

## 2018-08-19 NOTE — Patient Instructions (Addendum)
   A pneumonia vaccine is recommended for people who are on an immunosuppressant. However since you have had Guillain disease twice, I would suggest talking to your neurologist about this risk of getting this vaccine

## 2018-08-19 NOTE — Progress Notes (Signed)
Patient: Keith Carpenter Male    DOB: 1956-06-09   62 y.o.   MRN: [id number] Visit Date: 08/19/2018  Today's Provider: Lelon Huh, MD   Chief Complaint  Patient presents with  . Diabetes  . Depression   Subjective:    HPI  Diabetes Mellitus Type II, Follow-up:   Lab Results  Component Value Date   HGBA1C 6.8 (A) 08/19/2018   HGBA1C 6.7 (A) 04/07/2018   HGBA1C 6.5 08/06/2017    Last seen for diabetes 4 months ago.  Management since then includes no changes. He reports good compliance with treatment. He is not having side effects.  Current symptoms include none and have been stable. Home blood sugar records: trend: stable. Patient checks BS about once weekly.   Episodes of hypoglycemia? no   Current Insulin Regimen: none Most Recent Eye Exam: due Weight trend: stable Prior visit with dietician: no Current diet: well balanced Current exercise: no regular exercise, but he does stay active.   Pertinent Labs:    Component Value Date/Time   CHOL 171 03/22/2017 0951   CHOL 220 (H) 02/23/2012 0416   TRIG 117 03/22/2017 0951   TRIG 104 02/23/2012 0416   HDL 39 (L) 03/22/2017 0951   HDL 36 (L) 02/23/2012 0416   LDLCALC 109 (H) 03/22/2017 0951   LDLCALC 163 (H) 02/23/2012 0416   CREATININE 0.84 09/29/2017 1514   CREATININE 0.93 07/20/2017 1135    Wt Readings from Last 3 Encounters:  08/19/18 225 lb (102.1 kg)  08/02/18 220 lb 12.8 oz (100.2 kg)  04/07/18 219 lb (99.3 kg)   Depression- from 11/16/2017. Patient was started back on Cymbalta. He reports good symptom control. On current medications.    Depression screen [facility] 2/9 01/05/2018 08/06/2017 11/09/2016  Decreased Interest 0 2 0  Down, Depressed, Hopeless 0 2 0  PHQ - 2 Score 0 4 0  Altered sleeping - 3 -  Tired, decreased energy - 3 -  Change in appetite - 2 -  Feeling bad or failure about yourself  - 1 -  Trouble concentrating - 1 -  Moving slowly or fidgety/restless - 2 -  Suicidal thoughts  - 0 -  PHQ-9 Score - 16 -  Difficult doing work/chores - Not difficult at all -   He is no on Taltz injections for RA by Dr. Meda Coffee which he reports has been working well. Is doing much better with current dose of Fentanyl, rarely take hydrocodone, and considering reducing dose in the next few months if continues to do well with Taltz.      Allergies  Allergen Reactions  . Influenza Vaccines Other (See Comments)    Guillain Barre  . Declomycin [Demeclocycline]   . Demeclocycline Hcl   . Ginger   . Glipizide     Nausea   . Metformin And Related Nausea Only    Metformin IR 500 BID  . Other     Other reaction(s): Other (See Comments) Uncoded Allergy. Allergen: Other Allergy: See Patient Chart for Details, Other Reaction: Other reaction  . Remicade [Infliximab]   . Suvorexant Other (See Comments)    vision changes  . Clarithromycin Rash  . Pioglitazone Itching, Swelling and Rash     Current Outpatient Medications:  .  amLODipine (NORVASC) 5 MG tablet, TAKE ONE (1) TABLET EACH DAY, Disp: 30 tablet, Rfl: 12 .  atorvastatin (LIPITOR) 10 MG tablet, Take 1 tablet (10 mg total) by mouth every evening., Disp: 90 tablet,  Rfl: 4 .  CUTIVATE 0.05 % LOTN, APPLY TO AFFECTED AREA TWICE A DAY AS NEEDED FOR RASH, Disp: 120 mL, Rfl: 5 .  DULoxetine (CYMBALTA) 30 MG capsule, Take 1 capsule (30 mg total) by mouth daily., Disp: 30 capsule, Rfl: 5 .  fentaNYL (DURAGESIC - DOSED MCG/HR) 25 MCG/HR patch, Place 1 patch (25 mcg total) onto the skin every other day., Disp: 15 patch, Rfl: 0 .  HYDROcodone-acetaminophen (NORCO/VICODIN) 5-325 MG tablet, Take 1 tablet by mouth 2 (two) times daily as needed for moderate pain., Disp: 60 tablet, Rfl: 0 .  levothyroxine (SYNTHROID, LEVOTHROID) 125 MCG tablet, , Disp: , Rfl: 3 .  metoprolol tartrate (LOPRESSOR) 50 MG tablet, TAKE ONE TABLET TWICE DAILY, Disp: 60 tablet, Rfl: 11 .  omeprazole (PRILOSEC) 20 MG capsule, Take 1 capsule (20 mg total) by mouth daily.,  Disp: 30 capsule, Rfl: 12 .  simethicone (MYLICON) 034 MG chewable tablet, Chew 125 mg by mouth every 6 (six) hours as needed for flatulence., Disp: , Rfl:  .  TALTZ 80 MG/ML SOAJ, , Disp: , Rfl:  .  triamcinolone cream (KENALOG) 0.1 %, , Disp: , Rfl: 2 .  zolpidem (AMBIEN) 10 MG tablet, Take 1-2 tablets (10-20 mg total) by mouth at bedtime as needed., Disp: 60 tablet, Rfl: 4  Review of Systems  Constitutional: Negative.   Respiratory: Negative for cough and shortness of breath.   Cardiovascular: Negative for chest pain, palpitations and leg swelling.  Endocrine: Negative for cold intolerance, heat intolerance, polydipsia, polyphagia and polyuria.  Musculoskeletal: Negative for arthralgias, back pain and joint swelling.  Skin: Negative.   Neurological: Negative for dizziness, weakness, light-headedness and headaches.    Social History   Tobacco Use  . Smoking status: Never Smoker  . Smokeless tobacco: Never Used  Substance Use Topics  . Alcohol use: No   Objective:   BP 132/60 (BP Location: Left Arm, Patient Position: Sitting, Cuff Size: Large)   Pulse (!) 58   Temp 98.7 F (37.1 C)   Resp 16   Ht 5\' 11"  (1.803 m)   Wt 225 lb (102.1 kg)   BMI 31.38 kg/m  Vitals:   08/19/18 1535  BP: 132/60  Pulse: (!) 58  Resp: 16  Temp: 98.7 F (37.1 C)  Weight: 225 lb (102.1 kg)  Height: 5\' 11"  (1.803 m)     Physical Exam   General Appearance:    Alert, cooperative, no distress  Eyes:    PERRL, conjunctiva/corneas clear, EOM's intact       Lungs:     Clear to auscultation bilaterally, respirations unlabored  Heart:    Regular rate and rhythm  Neurologic:   Awake, alert, oriented x 3. No apparent focal neurological           defect.       Results for orders placed or performed in visit on 08/19/18  POCT glycosylated hemoglobin (Hb A1C)  Result Value Ref Range   Hemoglobin A1C 6.8 (A) 4.0 - 5.6 %   HbA1c POC (<> result, manual entry)     HbA1c, POC (prediabetic range)      HbA1c, POC (controlled diabetic range)     Est. average glucose Bld gHb Est-mCnc 148        Assessment & Plan:     1. Type 2 diabetes mellitus without complication, without long-term current use of insulin (HCC) Well controlled.  Continue low glycemic diet.  - POCT glycosylated hemoglobin (Hb A1C) - EKG 12-Lead  2.  Depression, unspecified depression type Improved with Cymbalta.   3. Thoracic aortic aneurysm without rupture ([place]) Stable, had CT at [facility] in September.  - EKG 12-Lead  4. Essential hypertension Well controlled.  Continue current medications.   - EKG 12-Lead  5. Lumbar herniated disc Stable. Pain fairly well controlled.   6. Psoriatic arthritis ([place]) Improved on Taltz prescribed by rheumatology. Continue current medications.  Rarely requiring hydrocodone/apap for breakthrough pain. Consider reducing Fentanyl at follow up if continues to do well.        Lelon Huh, MD  [facility]

## 2018-08-24 ENCOUNTER — Ambulatory Visit
Admission: RE | Admit: 2018-08-24 | Discharge: 2018-08-24 | Disposition: A | Discharge status: Home / Self Care | Payer: BC Managed Care – PPO | Source: Ambulatory Visit | Attending: General Surgery | Admitting: General Surgery

## 2018-08-24 DIAGNOSIS — R1011 Right upper quadrant pain: Secondary | ICD-10-CM | POA: Insufficient documentation

## 2018-08-24 DIAGNOSIS — R1010 Upper abdominal pain, unspecified: Secondary | ICD-10-CM

## 2018-08-24 MED ORDER — TECHNETIUM TC 99M MEBROFENIN IV KIT
5.0000 | PACK | Freq: Once | INTRAVENOUS | Status: AC | PRN
  Administered 2018-08-24: 5.472 via INTRAVENOUS

## 2018-09-01 ENCOUNTER — Other Ambulatory Visit: Payer: Self-pay

## 2018-09-01 DIAGNOSIS — F329 Major depressive disorder, single episode, unspecified: Secondary | ICD-10-CM

## 2018-09-01 DIAGNOSIS — F32A Depression, unspecified: Secondary | ICD-10-CM

## 2018-09-01 MED ORDER — DULOXETINE HCL 30 MG PO CPEP: 30.0000 mg | ORAL_CAPSULE | Freq: Every day | ORAL | 11 refills | Status: DC

## 2018-09-01 NOTE — Telephone Encounter (Signed)
Please advise 

## 2018-09-01 NOTE — Telephone Encounter (Signed)
Pharmacist from [facility] states that patient was originally having prescriptions filled at [facility] but has since now closed. Pharmacist states that his medications have transferred except for Duloxetine 30mg  , she is requesting that we send in prescription to [facility].KW

## 2018-09-09 ENCOUNTER — Other Ambulatory Visit: Payer: Self-pay | Admitting: Family Medicine

## 2018-09-09 DIAGNOSIS — L4050 Arthropathic psoriasis, unspecified: Secondary | ICD-10-CM

## 2018-09-09 MED ORDER — FENTANYL 25 MCG/HR TD PT72: 25.0000 ug | MEDICATED_PATCH | TRANSDERMAL | 0 refills | Status: DC

## 2018-09-09 NOTE — Telephone Encounter (Signed)
See refill request.

## 2018-09-30 ENCOUNTER — Other Ambulatory Visit: Payer: Self-pay | Admitting: Family Medicine

## 2018-09-30 DIAGNOSIS — G4700 Insomnia, unspecified: Secondary | ICD-10-CM

## 2018-09-30 NOTE — Telephone Encounter (Signed)
We received a fax from [facility] requesting a new prescription for zolpidem (AMBIEN) 10 MG tablet.  They are not able to transfer this prescription from [facility].

## 2018-10-01 MED ORDER — ZOLPIDEM TARTRATE 10 MG PO TABS: 10.0000 mg | ORAL_TABLET | Freq: Every evening | ORAL | 4 refills | Status: DC | PRN

## 2018-10-03 ENCOUNTER — Telehealth: Payer: Self-pay

## 2018-10-03 NOTE — Telephone Encounter (Signed)
It is correct as written. 1-2 at bedtime.

## 2018-10-03 NOTE — Telephone Encounter (Signed)
[facility] wanted clarification of pt's Zolpidem rx.  The directions state to take 10mg  1-2 at bedtime.  Pharmacist is stating the max dose is 10mg  a night.  Please advise.  Thanks,   -Mickel Baas

## 2018-10-04 ENCOUNTER — Telehealth: Payer: Self-pay | Admitting: Family Medicine

## 2018-10-04 NOTE — Telephone Encounter (Signed)
I spoke with pharmacist and advised her as below. She states the max dose is 10mg  at night. She states she is going to make note that Dr. Caryn Section approved a dose that is more than the max dose.

## 2018-10-04 NOTE — Telephone Encounter (Signed)
Pt needing prior aurth to fill Rx: zolpidem (AMBIEN) 10 MG tablet  Please fill at:  [place], [place] - [place] [phone] (Phone) [phone] (Fax)    Thanks, [place]

## 2018-10-07 NOTE — Telephone Encounter (Signed)
Sent PA through Cover My Meds.   Thanks,   -Laura  

## 2018-10-08 ENCOUNTER — Other Ambulatory Visit: Payer: Self-pay | Admitting: Family Medicine

## 2018-10-08 DIAGNOSIS — L4050 Arthropathic psoriasis, unspecified: Secondary | ICD-10-CM

## 2018-10-10 MED ORDER — FENTANYL 25 MCG/HR TD PT72: 1.0000 | MEDICATED_PATCH | TRANSDERMAL | 0 refills | Status: DC

## 2018-11-05 ENCOUNTER — Other Ambulatory Visit: Payer: Self-pay | Admitting: Family Medicine

## 2018-11-05 DIAGNOSIS — L4050 Arthropathic psoriasis, unspecified: Secondary | ICD-10-CM

## 2018-11-07 MED ORDER — FENTANYL 25 MCG/HR TD PT72: 1.0000 | MEDICATED_PATCH | TRANSDERMAL | 0 refills | Status: DC

## 2018-11-28 ENCOUNTER — Other Ambulatory Visit: Payer: Self-pay

## 2018-11-28 ENCOUNTER — Encounter: Payer: Self-pay | Admitting: Family Medicine

## 2018-11-28 ENCOUNTER — Ambulatory Visit: Payer: BC Managed Care – PPO | Admitting: Family Medicine

## 2018-11-28 ENCOUNTER — Other Ambulatory Visit: Payer: Self-pay | Admitting: Family Medicine

## 2018-11-28 VITALS — BP 153/82 | HR 60 | Temp 98.8°F | Resp 16 | Wt 217.4 lb

## 2018-11-28 DIAGNOSIS — L989 Disorder of the skin and subcutaneous tissue, unspecified: Secondary | ICD-10-CM

## 2018-11-28 MED ORDER — MUPIROCIN 2 % EX OINT: 1.0000 "application " | TOPICAL_OINTMENT | Freq: Two times a day (BID) | CUTANEOUS | 0 refills | Status: DC

## 2018-11-28 NOTE — Progress Notes (Signed)
Patient: Keith Carpenter Male    DOB: 1956-01-09   63 y.o.   MRN: [id number] Visit Date: 11/28/2018  Today's Provider: Lelon Huh, MD   Chief Complaint  Patient presents with  . Rash   Subjective:     Rash  This is a new problem. The current episode started in the past 7 days. The problem has been gradually worsening since onset. The affected locations include the left wrist and face. The rash is characterized by redness, draining and blistering. 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 Pertinent negatives include no anorexia, congestion, cough, diarrhea, eye pain, facial edema, fatigue, fever, joint pain, nail changes, rhinorrhea, shortness of breath, sore throat or vomiting. Treatments tried: patient reports that he has applied bleach and apple cidar vinegar to his skin. The treatment provided no relief.  States lesions are not itchy.    Allergies  Allergen Reactions  . Influenza Vaccines Other (See Comments)    Guillain Barre  . Ixekizumab Hives    Rashes, swollen eyes, heat in facial region  . Declomycin [Demeclocycline]   . Demeclocycline Hcl   . Ginger   . Glipizide     Nausea   . Metformin And Related Nausea Only    Metformin IR 500 BID  . Other     Other reaction(s): Other (See Comments) Uncoded Allergy. Allergen: Other Allergy: See Patient Chart for Details, Other Reaction: Other reaction  . Remicade [Infliximab]   . Suvorexant Other (See Comments)    vision changes  . Clarithromycin Rash  . Pioglitazone Itching, Swelling and Rash     Current Outpatient Medications:  .  amLODipine (NORVASC) 5 MG tablet, TAKE ONE (1) TABLET EACH DAY, Disp: 30 tablet, Rfl: 12 .  atorvastatin (LIPITOR) 10 MG tablet, Take 1 tablet (10 mg total) by mouth every evening., Disp: 90 tablet, Rfl: 4 .  CUTIVATE 0.05 % LOTN, APPLY TO AFFECTED AREA TWICE A DAY AS NEEDED FOR RASH, Disp: 120 mL, Rfl: 5 .  DULoxetine (CYMBALTA)  30 MG capsule, Take 1 capsule (30 mg total) by mouth daily., Disp: 30 capsule, Rfl: 11 .  EUCRISA 2 % OINT, , Disp: , Rfl:  .  fentaNYL (DURAGESIC) 25 MCG/HR, Place 1 patch onto the skin every other day., Disp: 15 patch, Rfl: 0 .  HYDROcodone-acetaminophen (NORCO/VICODIN) 5-325 MG tablet, Take 1 tablet by mouth 2 (two) times daily as needed for moderate pain., Disp: 60 tablet, Rfl: 0 .  levothyroxine (SYNTHROID, LEVOTHROID) 125 MCG tablet, , Disp: , Rfl: 3 .  metoprolol tartrate (LOPRESSOR) 50 MG tablet, TAKE ONE TABLET TWICE DAILY, Disp: 60 tablet, Rfl: 11 .  omeprazole (PRILOSEC) 20 MG capsule, Take 1 capsule (20 mg total) by mouth daily., Disp: 30 capsule, Rfl: 12 .  simethicone (MYLICON) 774 MG chewable tablet, Chew 125 mg by mouth every 6 (six) hours as needed for flatulence., Disp: , Rfl:  .  TALTZ 80 MG/ML SOAJ, , Disp: , Rfl:  .  triamcinolone cream (KENALOG) 0.1 %, , Disp: , Rfl: 2 .  zolpidem (AMBIEN) 10 MG tablet, Take 1-2 tablets (10-20 mg total) by mouth at bedtime as needed., Disp: 60 tablet, Rfl: 4  Review of Systems  Constitutional: Negative for fatigue and fever.  HENT: Negative for congestion, rhinorrhea and sore throat.   Eyes: Negative for pain.  Respiratory: Negative for cough and shortness of breath.   Gastrointestinal: Negative for anorexia, diarrhea and vomiting.  Musculoskeletal: Negative for joint pain.  Skin: Positive for rash. Negative for nail changes.    Social History   Tobacco Use  . Smoking status: Never Smoker  . Smokeless tobacco: Never Used  Substance Use Topics  . Alcohol use: No      Objective:   BP (!) 153/82   Pulse 60   Temp 98.8 F (37.1 C) (Oral)   Resp 16   Wt 217 lb 6.4 oz (98.6 kg)   BMI 30.32 kg/m  Vitals:   11/28/18 1439  BP: (!) 153/82  Pulse: 60  Resp: 16  Temp: 98.8 F (37.1 C)  TempSrc: Oral  Weight: 217 lb 6.4 oz (98.6 kg)     Physical Exam  General appearance: alert, well developed, well nourished,  cooperative and in no distress Head: Normocephalic, without obvious abnormality, atraumatic Respiratory: Respirations even and unlabored, normal respiratory rate Extremities: No gross deformities Skin: dime size red ulcerated area of dorsum of left forearm with about a cm of surrounding erythema. Small red papular lesion left side of nares.      Assessment & Plan    1. Skin lesion Not c/w yeast infection, but has has started using OTC clortrimazole due to ringworm exposure at work. Is more likely bacterial infection such as staph or strep.  - mupirocin ointment (BACTROBAN) 2 %; Apply 1 application topically 2 (two) times daily.  Dispense: 22 g; Refill: 0 - Aerobic culture    Patient seen and examined by Dr. Lelon Huh, note scribed by Jennings Books, NCMA Lelon Huh, MD  [facility]

## 2018-11-28 NOTE — Patient Instructions (Signed)
.   Please review the attached list of medications and notify my office if there are any errors.   . Please bring all of your medications to every appointment so we can make sure that our medication list is the same as yours.   

## 2018-12-01 ENCOUNTER — Telehealth: Payer: Self-pay

## 2018-12-01 LAB — AEROBIC CULTURE

## 2018-12-01 NOTE — Telephone Encounter (Signed)
-----   Message from Birdie Sons, MD sent at 12/01/2018 11:51 AM EDT ----- Skin culture is negative. If improving then continue mupirocin cream, if not then will need to take antibiotic by mouth

## 2018-12-01 NOTE — Telephone Encounter (Signed)
Pt advised.  He reports his leg is improving and will continue with mupirocin cream.   Thanks,   -Mickel Baas

## 2018-12-05 ENCOUNTER — Other Ambulatory Visit: Payer: Self-pay | Admitting: Family Medicine

## 2018-12-05 DIAGNOSIS — L4050 Arthropathic psoriasis, unspecified: Secondary | ICD-10-CM

## 2018-12-06 MED ORDER — FENTANYL 25 MCG/HR TD PT72: 1.0000 | MEDICATED_PATCH | TRANSDERMAL | 0 refills | Status: DC

## 2019-01-01 ENCOUNTER — Other Ambulatory Visit: Payer: Self-pay | Admitting: Family Medicine

## 2019-01-02 MED ORDER — HYDROCODONE-ACETAMINOPHEN 5-325 MG PO TABS: 1.0000 | ORAL_TABLET | Freq: Two times a day (BID) | ORAL | 0 refills | Status: DC | PRN

## 2019-01-02 NOTE — Telephone Encounter (Signed)
Please review

## 2019-01-12 ENCOUNTER — Other Ambulatory Visit: Payer: Self-pay | Admitting: Family Medicine

## 2019-01-12 DIAGNOSIS — I10 Essential (primary) hypertension: Secondary | ICD-10-CM

## 2019-02-17 ENCOUNTER — Other Ambulatory Visit: Payer: Self-pay | Admitting: Orthopedic Surgery

## 2019-02-17 DIAGNOSIS — M4807 Spinal stenosis, lumbosacral region: Secondary | ICD-10-CM

## 2019-02-17 DIAGNOSIS — M549 Dorsalgia, unspecified: Secondary | ICD-10-CM

## 2019-02-20 ENCOUNTER — Ambulatory Visit: Payer: Self-pay | Admitting: Family Medicine

## 2019-02-28 ENCOUNTER — Ambulatory Visit: Payer: BC Managed Care – PPO

## 2019-03-01 ENCOUNTER — Ambulatory Visit: Payer: BC Managed Care – PPO | Admitting: Gastroenterology

## 2019-03-03 ENCOUNTER — Ambulatory Visit: Payer: BC Managed Care – PPO | Admitting: Family Medicine

## 2019-03-03 ENCOUNTER — Other Ambulatory Visit: Payer: Self-pay

## 2019-03-03 ENCOUNTER — Encounter: Payer: Self-pay | Admitting: Family Medicine

## 2019-03-03 VITALS — BP 138/72 | HR 56 | Temp 98.8°F | Resp 16 | Ht 71.0 in | Wt 216.0 lb

## 2019-03-03 DIAGNOSIS — I10 Essential (primary) hypertension: Secondary | ICD-10-CM | POA: Diagnosis not present

## 2019-03-03 DIAGNOSIS — E785 Hyperlipidemia, unspecified: Secondary | ICD-10-CM | POA: Diagnosis not present

## 2019-03-03 DIAGNOSIS — F329 Major depressive disorder, single episode, unspecified: Secondary | ICD-10-CM

## 2019-03-03 DIAGNOSIS — F32A Depression, unspecified: Secondary | ICD-10-CM

## 2019-03-03 DIAGNOSIS — L409 Psoriasis, unspecified: Secondary | ICD-10-CM

## 2019-03-03 DIAGNOSIS — E119 Type 2 diabetes mellitus without complications: Secondary | ICD-10-CM

## 2019-03-03 DIAGNOSIS — E039 Hypothyroidism, unspecified: Secondary | ICD-10-CM

## 2019-03-03 DIAGNOSIS — L4050 Arthropathic psoriasis, unspecified: Secondary | ICD-10-CM

## 2019-03-03 DIAGNOSIS — Z125 Encounter for screening for malignant neoplasm of prostate: Secondary | ICD-10-CM

## 2019-03-03 LAB — POCT UA - MICROALBUMIN: Microalbumin Ur, POC: 20 mg/L

## 2019-03-03 LAB — POCT GLYCOSYLATED HEMOGLOBIN (HGB A1C)
Est. average glucose Bld gHb Est-mCnc: 143
Hemoglobin A1C: 6.6 % — AB (ref 4.0–5.6)

## 2019-03-03 NOTE — Progress Notes (Signed)
Patient: Keith Carpenter Male    DOB: 07/08/1956   63 y.o.   MRN: [id number] Visit Date: 03/03/2019  Today's Provider: Lelon Huh, MD   Chief Complaint  Patient presents with  . Hypertension  . Hyperlipidemia  . Diabetes  . Depression   Subjective:   HPI  Hypertension, follow-up:  BP Readings from Last 3 Encounters:  03/03/19 138/72  11/28/18 (!) 153/82  08/19/18 132/60    He was last seen for hypertension 6 months ago.  BP at that visit was 132/60. Management since that visit includes no changes. He reports good compliance with treatment. He is not having side effects.  He is exercising. He is adherent to low salt diet.   Outside blood pressures are checked daily. He reports that it has averaged in the 140s/70s.  He is experiencing none.  Patient denies exertional chest pressure/discomfort, lower extremity edema and palpitations.    Weight trend: stable Wt Readings from Last 3 Encounters:  03/03/19 216 lb (98 kg)  11/28/18 217 lb 6.4 oz (98.6 kg)  08/19/18 225 lb (102.1 kg)    Current diet: well balanced    Lipid/Cholesterol, Follow-up:   Last seen for this6 months ago.  Management changes since that visit include no changes. . Last Lipid Panel:    Component Value Date/Time   CHOL 171 03/22/2017 0951   CHOL 220 (H) 02/23/2012 0416   TRIG 117 03/22/2017 0951   TRIG 104 02/23/2012 0416   HDL 39 (L) 03/22/2017 0951   HDL 36 (L) 02/23/2012 0416   CHOLHDL 4.4 03/22/2017 0951   VLDL 21 02/23/2012 0416   LDLCALC 109 (H) 03/22/2017 0951   LDLCALC 163 (H) 02/23/2012 0416    Risk factors for vascular disease include diabetes mellitus and hypertension  He reports good compliance with treatment. He is not having side effects.  Current symptoms include none and have been stable.   Diabetes Mellitus Type II, Follow-up:   Lab Results  Component Value Date   HGBA1C 6.6 (A) 03/03/2019   HGBA1C 6.8 (A) 08/19/2018   HGBA1C 6.7 (A)  04/07/2018    Last seen for diabetes 6 months ago.  Management since then includes no changes. He reports good compliance with treatment. He is not having side effects.  Current symptoms include none and have been stable. Home blood sugar records: fasting range: 140s  Episodes of hypoglycemia? no   Current Insulin Regimen: none Most Recent Eye Exam: up to date  Depression, follow up: Patient was last seen in the office 6 months ago. No medications were changed since his last visit. He is currently taking duloxetine 30mg  once daily for this. He reports good compliance and good symptom control. Depression screen [facility] 2/9 03/03/2019 01/05/2018 08/06/2017  Decreased Interest 0 0 2  Down, Depressed, Hopeless 0 0 2  PHQ - 2 Score 0 0 4  Altered sleeping 3 - 3  Tired, decreased energy 1 - 3  Change in appetite 1 - 2  Feeling bad or failure about yourself  0 - 1  Trouble concentrating 0 - 1  Moving slowly or fidgety/restless 0 - 2  Suicidal thoughts 0 - 0  PHQ-9 Score 5 - 16  Difficult doing work/chores Not difficult at all - Not difficult at all   He is now on Taltz prescribed by Dr. Nehemiah Massed for psoriatic arthritis and reports it is working very well. He has weaned completely off of Fentanyl patches and now only takes 1/2  hydrocodone/apap once or twice a day.    Allergies  Allergen Reactions  . Influenza Vaccines Other (See Comments)    Guillain Barre  . Ixekizumab Hives    Rashes, swollen eyes, heat in facial region  . Declomycin [Demeclocycline]   . Demeclocycline Hcl   . Ginger   . Glipizide     Nausea   . Metformin And Related Nausea Only    Metformin IR 500 BID  . Other     Other reaction(s): Other (See Comments) Uncoded Allergy. Allergen: Other Allergy: See Patient Chart for Details, Other Reaction: Other reaction  . Remicade [Infliximab]   . Suvorexant Other (See Comments)    vision changes  . Clarithromycin Rash  . Pioglitazone Itching, Swelling and Rash      Current Outpatient Medications:  .  amLODipine (NORVASC) 5 MG tablet, TAKE ONE (1) TABLET EACH DAY, Disp: 30 tablet, Rfl: 12 .  atorvastatin (LIPITOR) 10 MG tablet, Take 1 tablet (10 mg total) by mouth every evening., Disp: 90 tablet, Rfl: 4 .  CUTIVATE 0.05 % LOTN, APPLY TO AFFECTED AREA TWICE A DAY AS NEEDED FOR RASH, Disp: 120 mL, Rfl: 5 .  DULoxetine (CYMBALTA) 30 MG capsule, Take 1 capsule (30 mg total) by mouth daily., Disp: 30 capsule, Rfl: 11 .  EUCRISA 2 % OINT, , Disp: , Rfl:  .  HYDROcodone-acetaminophen (NORCO/VICODIN) 5-325 MG tablet, Take 1 tablet by mouth 2 (two) times daily as needed for moderate pain., Disp: 60 tablet, Rfl: 0 .  levothyroxine (SYNTHROID, LEVOTHROID) 125 MCG tablet, , Disp: , Rfl: 3 .  metoprolol tartrate (LOPRESSOR) 50 MG tablet, TAKE 1 TABLET BY MOUTH TWICE A DAY, Disp: 60 tablet, Rfl: 11 .  mupirocin ointment (BACTROBAN) 2 %, Apply 1 application topically 2 (two) times daily., Disp: 22 g, Rfl: 0 .  omeprazole (PRILOSEC) 20 MG capsule, Take 1 capsule (20 mg total) by mouth daily., Disp: 30 capsule, Rfl: 12 .  simethicone (MYLICON) 716 MG chewable tablet, Chew 125 mg by mouth every 6 (six) hours as needed for flatulence., Disp: , Rfl:  .  TALTZ 80 MG/ML SOAJ, , Disp: , Rfl:  .  triamcinolone cream (KENALOG) 0.1 %, , Disp: , Rfl: 2 .  zolpidem (AMBIEN) 10 MG tablet, Take 1-2 tablets (10-20 mg total) by mouth at bedtime as needed., Disp: 60 tablet, Rfl: 4 .  fentaNYL (DURAGESIC) 25 MCG/HR, Place 1 patch onto the skin every other day. (Patient not taking: Reported on 03/03/2019), Disp: 15 patch, Rfl: 0  Review of Systems  Constitutional: Negative for appetite change, chills and fever.  Respiratory: Negative for chest tightness, shortness of breath and wheezing.   Cardiovascular: Negative for chest pain and palpitations.  Gastrointestinal: Negative for abdominal pain, nausea and vomiting.    Social History   Tobacco Use  . Smoking status: Never Smoker  .  Smokeless tobacco: Never Used  Substance Use Topics  . Alcohol use: No      Objective:   BP 138/72 (BP Location: Left Arm, Patient Position: Sitting, Cuff Size: Large)   Pulse (!) 56   Temp 98.8 F (37.1 C)   Resp 16   Ht 5\' 11"  (1.803 m)   Wt 216 lb (98 kg)   SpO2 98%   BMI 30.13 kg/m  Vitals:   03/03/19 0845  BP: 138/72  Pulse: (!) 56  Resp: 16  Temp: 98.8 F (37.1 C)  SpO2: 98%  Weight: 216 lb (98 kg)  Height: 5\' 11"  (1.803 m)  Physical Exam  General appearance: alert, well developed, well nourished, cooperative and in no distress Head: Normocephalic, without obvious abnormality, atraumatic Respiratory: Respirations even and unlabored, normal respiratory rate Extremities: No gross deformities Skin: Skin color, texture, turgor normal. No rashes seen  Psych: Appropriate mood and affect. Neurologic: Mental status: Alert, oriented to person, place, and time, thought content appropriate.  Results for orders placed or performed in visit on 03/03/19  POCT glycosylated hemoglobin (Hb A1C)  Result Value Ref Range   Hemoglobin A1C 6.6 (A) 4.0 - 5.6 %   HbA1c POC (<> result, manual entry)     HbA1c, POC (prediabetic range)     HbA1c, POC (controlled diabetic range)     Est. average glucose Bld gHb Est-mCnc 143   POCT UA - Microalbumin  Result Value Ref Range   Microalbumin Ur, POC 20 mg/L   Creatinine, POC     Albumin/Creatinine Ratio, Urine, POC         Assessment & Plan    1. Essential hypertension Well controlled.  Continue current medications.    2. Hyperlipidemia, unspecified hyperlipidemia type Current diet controlled.  - Comprehensive metabolic panel - Lipid panel  3. Type 2 diabetes mellitus without complication, without long-term current use of insulin (HCC) Well controlled.  Continue current medications.   - POCT glycosylated hemoglobin (Hb A1C) - POCT UA - Microalbumin  4. Depression, unspecified depression type Well controlled.  Continue  current medications.    5. Prostate cancer screening  - PSA  6. Hypothyroidism, unspecified type  - TSH  7. Psoriasis (a type of skin inflammation) Greatly improved on Taltz  8. Psoriatic arthritis ([place])    The entirety of the information documented in the History of Present Illness, Review of Systems and Physical Exam were personally obtained by me. Portions of this information were initially documented by Wilburt Finlay, CMA and reviewed by me for thoroughness and accuracy.      Lelon Huh, MD  [facility]

## 2019-03-03 NOTE — Patient Instructions (Addendum)
.   Please review the attached list of medications and notify my office if there are any errors.   . Please bring all of your medications to every appointment so we can make sure that our medication list is the same as yours.    The CDC recommends two doses of Shingrix (the shingles vaccine) separated by 2 to 6 months for adults age 61 years and older. I recommend checking with your insurance plan regarding coverage for this vaccine.      You can take OTC Zinc supplement 100mg  a day to help immune system.    If you feel like a viral infections, such as a head cold, is starting, you can take OTC echinacea for up to one week

## 2019-03-04 LAB — LIPID PANEL
Chol/HDL Ratio: 2.8 ratio (ref 0.0–5.0)
Cholesterol, Total: 116 mg/dL (ref 100–199)
HDL: 42 mg/dL (ref >39)
LDL Calculated: 61 mg/dL (ref 0–99)
Triglycerides: 65 mg/dL (ref 0–149)
VLDL Cholesterol Cal: 13 mg/dL (ref 5–40)

## 2019-03-04 LAB — COMPREHENSIVE METABOLIC PANEL
ALT: 19 IU/L (ref 0–44)
AST: 20 IU/L (ref 0–40)
Albumin/Globulin Ratio: 1.5 (ref 1.2–2.2)
Albumin: 4.4 g/dL (ref 3.8–4.9)
Alkaline Phosphatase: 81 IU/L (ref 39–117)
BUN/Creatinine Ratio: 10 (ref 10–24)
BUN: 10 mg/dL (ref 8–27)
Bilirubin Total: 0.4 mg/dL (ref 0.0–1.2)
CO2: 24 mmol/L (ref 20–29)
Calcium: 8.1 mg/dL — ABNORMAL LOW (ref 8.6–10.2)
Chloride: 98 mmol/L (ref 96–106)
Creatinine, Ser: 0.97 mg/dL (ref 0.76–1.27)
GFR calc Af Amer: 98 mL/min/{1.73_m2} (ref >59)
GFR calc non Af Amer: 84 mL/min/{1.73_m2} (ref >59)
Globulin, Total: 2.9 g/dL (ref 1.5–4.5)
Glucose: 113 mg/dL — ABNORMAL HIGH (ref 65–99)
Potassium: 4.4 mmol/L (ref 3.5–5.2)
Sodium: 140 mmol/L (ref 134–144)
Total Protein: 7.3 g/dL (ref 6.0–8.5)

## 2019-03-04 LAB — TSH: TSH: 0.202 u[IU]/mL — ABNORMAL LOW (ref 0.450–4.500)

## 2019-03-04 LAB — PSA: Prostate Specific Ag, Serum: 1.9 ng/mL (ref 0.0–4.0)

## 2019-03-08 ENCOUNTER — Ambulatory Visit (INDEPENDENT_AMBULATORY_CARE_PROVIDER_SITE_OTHER): Payer: BC Managed Care – PPO | Admitting: Gastroenterology

## 2019-03-08 DIAGNOSIS — R197 Diarrhea, unspecified: Secondary | ICD-10-CM

## 2019-03-08 DIAGNOSIS — R109 Unspecified abdominal pain: Secondary | ICD-10-CM

## 2019-03-08 NOTE — Progress Notes (Signed)
Keith Carpenter , MD [address]  [place]  [place], [place] [address]  Main: [phone]  Fax: [phone]   Primary Care Physician: Birdie Sons, MD  Virtual Visit via Video Note  I connected with patient on 03/08/19 at  9:15 AM EDT by video and verified that I am speaking with the correct person using two identifiers.   I discussed the limitations, risks, security and privacy concerns of performing an evaluation and management service by video  and the availability of in person appointments. I also discussed with the patient that there may be a patient responsible charge related to this service. The patient expressed understanding and agreed to proceed.  Location of Patient: Home Location of Provider: Home Persons involved: Patient and provider only   History of Present Illness: Chief Complaint  Patient presents with  . Abdominal Pain    bloating, diarrhea    HPI: Keith Carpenter is a 63 y.o. male   Summary of history :  He was initially seen by myself in January 2019 for abdominal pain.  Treated for H. pylori antibody back in 08/03/2017.  Eradication confirmed.  He developed diverticulitis of the descending colon in December 2018.  Was treated with 2 courses of antibiotics.  At his initial visit back in 2019 he has had abdominal pain since October 2018.  Left side of the abdomen and after treatment with diverticulitis seem to help.  A few weeks later the pain recurred.  Localized in nature.  Sharp and dull in description.  At that point of time he had lost 30 pounds of weight in over 4 months.  At that point of time he also had gas bloating abdominal distention and change in bowel habits and relief of the pain with bowel movements.  Interval history   January 20,2019 -  03/08/2019  10/19/2017: CT scan of the abdomen: Resolution of diverticulitis.  Bilateral inguinal hernias containing fat with stranding identified within the left inguinal sac.  Mild prostatic  enlargement involving the central lobe.  In January 2019 he also had C. difficile diarrhea.  He was referred for his inguinal hernias to surgery.  At that point of time he mentioned to Dr. Bary Castilla that he had abdominal pain.  He underwent right upper quadrant ultrasound in December 2019 which was normal.  Subsequently was followed up by a HIDA scan.  HIDA scan was within normal limits.    Did not go through surgery. He has a follow up. Still having a lot of symptoms he has had at the last visit, lots of gas build up , on and off diarrhea, left sided abdominal pain- overall has had good and bad spells since last year but not completely gone. No diarrhea today had some yesterday which was very watery.   He has never had a colonoscopy . No family history of colon cancer. No further weight loss. Has been stable.   Current Outpatient Medications  Medication Sig Dispense Refill  . amLODipine (NORVASC) 5 MG tablet TAKE ONE (1) TABLET EACH DAY 30 tablet 12  . atorvastatin (LIPITOR) 10 MG tablet Take 1 tablet (10 mg total) by mouth every evening. 90 tablet 4  . CUTIVATE 0.05 % LOTN APPLY TO AFFECTED AREA TWICE A DAY AS NEEDED FOR RASH 120 mL 5  . DULoxetine (CYMBALTA) 30 MG capsule Take 1 capsule (30 mg total) by mouth daily. 30 capsule 11  . EUCRISA 2 % OINT     . HYDROcodone-acetaminophen (NORCO/VICODIN)  5-325 MG tablet Take 1 tablet by mouth 2 (two) times daily as needed for moderate pain. 60 tablet 0  . levothyroxine (SYNTHROID, LEVOTHROID) 125 MCG tablet   3  . metoprolol tartrate (LOPRESSOR) 50 MG tablet TAKE 1 TABLET BY MOUTH TWICE A DAY 60 tablet 11  . mupirocin ointment (BACTROBAN) 2 % Apply 1 application topically 2 (two) times daily. 22 g 0  . omeprazole (PRILOSEC) 20 MG capsule Take 1 capsule (20 mg total) by mouth daily. 30 capsule 12  . TALTZ 80 MG/ML SOAJ     . triamcinolone cream (KENALOG) 0.1 %   2  . zolpidem (AMBIEN) 10 MG tablet Take 1-2 tablets (10-20 mg total) by mouth at bedtime  as needed. 60 tablet 4  . simethicone (MYLICON) 751 MG chewable tablet Chew 125 mg by mouth every 6 (six) hours as needed for flatulence.     No current facility-administered medications for this visit.     Allergies as of 03/08/2019 - Review Complete 03/08/2019  Allergen Reaction Noted  . Influenza vaccines Other (See Comments) 09/27/2015  . Ixekizumab Hives 10/14/2018  . Declomycin [demeclocycline]  06/04/2014  . Demeclocycline hcl    . Ginger  06/04/2014  . Glipizide  01/04/2017  . Metformin and related Nausea Only 08/18/2016  . Other    . Remicade [infliximab]    . Suvorexant Other (See Comments) 07/12/2015  . Clarithromycin Rash 08/06/2017  . Pioglitazone Itching, Swelling, and Rash 01/14/2017    Review of Systems:    All systems reviewed and negative except where noted in HPI.  General Appearance:    Alert, cooperative, no distress, appears stated age  Head:    Normocephalic, without obvious abnormality, atraumatic  Eyes:    PERRL, conjunctiva/corneas clear,  Ears:    Grossly normal hearing    Neurologic:  Grossly normal    Observations/Objective:  Labs: CMP     Component Value Date/Time   NA 140 03/03/2019 0944   NA 141 02/23/2012 0416   K 4.4 03/03/2019 0944   K 4.3 02/23/2012 0416   CL 98 03/03/2019 0944   CL 104 02/23/2012 0416   CO2 24 03/03/2019 0944   CO2 27 02/23/2012 0416   GLUCOSE 113 (H) 03/03/2019 0944   GLUCOSE 137 (H) 08/25/2017 1650   GLUCOSE 121 (H) 02/23/2012 0416   BUN 10 03/03/2019 0944   BUN 16 02/23/2012 0416   CREATININE 0.97 03/03/2019 0944   CREATININE 0.93 07/20/2017 1135   CALCIUM 8.1 (L) 03/03/2019 0944   CALCIUM 8.4 (L) 02/23/2012 0416   PROT 7.3 03/03/2019 0944   ALBUMIN 4.4 03/03/2019 0944   AST 20 03/03/2019 0944   ALT 19 03/03/2019 0944   ALKPHOS 81 03/03/2019 0944   BILITOT 0.4 03/03/2019 0944   GFRNONAA 84 03/03/2019 0944   GFRNONAA 90 07/20/2017 1135   GFRAA 98 03/03/2019 0944   GFRAA 104 07/20/2017 1135   Lab  Results  Component Value Date   WBC 8.2 09/29/2017   HGB 14.7 09/29/2017   HCT 41.8 09/29/2017   MCV 85 09/29/2017   PLT 302 09/29/2017    Imaging Studies: No results found.  Assessment and Plan:   Keith Carpenter is a 63 y.o. y/o male last seen back in January 2019 for abdominal pain unintentional weight loss.  He did not follow-up subsequently.  He was found to have C. difficile diarrhea and treated.  He has had 2 episodes of diverticulitis.PErsistent on and off abdominal pains since last year with  a lot of gas . Never had a colonoscopyu   Plan 1.  He needs to have a colonoscopy since he has had this diverticulitis and not had a follow-up since. 2.  H. pylori stool antigen, EGD. 3.  PPI.  Low FODMAP diet.  PRN activated charcoal. 4. Check stol for GI PCR and C diff 5. He has a follow up with Dr Bary Castilla for his hernias.  I have discussed alternative options, risks & benefits,  which include, but are not limited to, bleeding, infection, perforation,respiratory complication & drug reaction.  The patient agrees with this plan & written consent will be obtained.       I discussed the assessment and treatment plan with the patient. The patient was provided an opportunity to ask questions and all were answered. The patient agreed with the plan and demonstrated an understanding of the instructions.   The patient was advised to call back or seek an in-person evaluation if the symptoms worsen or if the condition fails to improve as anticipated.    Dr Keith Bellows MD,MRCP [facility]) Gastroenterology/Hepatology Pager: [phone]   Speech recognition software was used to dictate this note.

## 2019-03-14 ENCOUNTER — Encounter: Payer: Self-pay | Admitting: Gastroenterology

## 2019-03-14 ENCOUNTER — Ambulatory Visit: Payer: BC Managed Care – PPO

## 2019-03-14 LAB — GI PROFILE, STOOL, PCR

## 2019-03-14 LAB — C DIFFICILE, CYTOTOXIN B

## 2019-03-14 LAB — C DIFFICILE TOXINS A+B W/RFLX: C difficile Toxins A+B, EIA: NEGATIVE

## 2019-03-21 ENCOUNTER — Other Ambulatory Visit: Payer: Self-pay

## 2019-03-21 DIAGNOSIS — R109 Unspecified abdominal pain: Secondary | ICD-10-CM

## 2019-03-21 DIAGNOSIS — R197 Diarrhea, unspecified: Secondary | ICD-10-CM

## 2019-03-21 MED ORDER — NA SULFATE-K SULFATE-MG SULF 17.5-3.13-1.6 GM/177ML PO SOLN: 1.0000 | Freq: Once | ORAL | 0 refills | Status: AC

## 2019-03-23 ENCOUNTER — Other Ambulatory Visit: Payer: Self-pay | Admitting: Family Medicine

## 2019-03-23 DIAGNOSIS — G4700 Insomnia, unspecified: Secondary | ICD-10-CM

## 2019-03-28 ENCOUNTER — Other Ambulatory Visit: Payer: Self-pay

## 2019-03-28 ENCOUNTER — Encounter: Payer: Self-pay | Admitting: Family Medicine

## 2019-03-28 ENCOUNTER — Other Ambulatory Visit
Admission: RE | Admit: 2019-03-28 | Discharge: 2019-03-28 | Disposition: A | Discharge status: Home / Self Care | Payer: BC Managed Care – PPO | Source: Ambulatory Visit | Attending: Gastroenterology | Admitting: Gastroenterology

## 2019-03-28 DIAGNOSIS — Z1159 Encounter for screening for other viral diseases: Secondary | ICD-10-CM | POA: Insufficient documentation

## 2019-03-28 LAB — SARS CORONAVIRUS 2 (TAT 6-24 HRS): SARS Coronavirus 2: NEGATIVE

## 2019-03-30 ENCOUNTER — Other Ambulatory Visit: Payer: Self-pay | Admitting: Family Medicine

## 2019-03-30 DIAGNOSIS — E785 Hyperlipidemia, unspecified: Secondary | ICD-10-CM

## 2019-03-31 ENCOUNTER — Encounter: Payer: Self-pay | Admitting: Emergency Medicine

## 2019-03-31 ENCOUNTER — Ambulatory Visit: Payer: BC Managed Care – PPO | Admitting: Anesthesiology

## 2019-03-31 ENCOUNTER — Ambulatory Visit
Admission: RE | Admit: 2019-03-31 | Discharge: 2019-03-31 | Disposition: A | Discharge status: Home / Self Care | Payer: BC Managed Care – PPO | Source: Ambulatory Visit | Attending: Gastroenterology | Admitting: Gastroenterology

## 2019-03-31 ENCOUNTER — Encounter
Admission: RE | Disposition: A | Discharge status: Home / Self Care | Payer: Self-pay | Source: Ambulatory Visit | Attending: Gastroenterology

## 2019-03-31 ENCOUNTER — Other Ambulatory Visit: Payer: Self-pay

## 2019-03-31 DIAGNOSIS — R197 Diarrhea, unspecified: Secondary | ICD-10-CM | POA: Diagnosis present

## 2019-03-31 DIAGNOSIS — Z85850 Personal history of malignant neoplasm of thyroid: Secondary | ICD-10-CM | POA: Diagnosis not present

## 2019-03-31 DIAGNOSIS — E119 Type 2 diabetes mellitus without complications: Secondary | ICD-10-CM | POA: Diagnosis not present

## 2019-03-31 DIAGNOSIS — R1084 Generalized abdominal pain: Secondary | ICD-10-CM | POA: Insufficient documentation

## 2019-03-31 DIAGNOSIS — K635 Polyp of colon: Secondary | ICD-10-CM | POA: Insufficient documentation

## 2019-03-31 DIAGNOSIS — R109 Unspecified abdominal pain: Secondary | ICD-10-CM

## 2019-03-31 DIAGNOSIS — K2970 Gastritis, unspecified, without bleeding: Secondary | ICD-10-CM | POA: Insufficient documentation

## 2019-03-31 DIAGNOSIS — K219 Gastro-esophageal reflux disease without esophagitis: Secondary | ICD-10-CM | POA: Diagnosis not present

## 2019-03-31 DIAGNOSIS — I10 Essential (primary) hypertension: Secondary | ICD-10-CM | POA: Insufficient documentation

## 2019-03-31 DIAGNOSIS — E039 Hypothyroidism, unspecified: Secondary | ICD-10-CM | POA: Insufficient documentation

## 2019-03-31 HISTORY — PX: COLONOSCOPY WITH PROPOFOL: SHX5780

## 2019-03-31 HISTORY — PX: ESOPHAGOGASTRODUODENOSCOPY (EGD) WITH PROPOFOL: SHX5813

## 2019-03-31 LAB — GLUCOSE, CAPILLARY: Glucose-Capillary: 129 mg/dL — ABNORMAL HIGH (ref 70–99)

## 2019-03-31 SURGERY — COLONOSCOPY WITH PROPOFOL
Anesthesia: General

## 2019-03-31 MED ORDER — PROPOFOL 500 MG/50ML IV EMUL
INTRAVENOUS | Status: DC | PRN
  Administered 2019-03-31: 75 ug/kg/min via INTRAVENOUS

## 2019-03-31 MED ORDER — PROPOFOL 10 MG/ML IV BOLUS
INTRAVENOUS | Status: DC | PRN
  Administered 2019-03-31: 20 mg via INTRAVENOUS
  Administered 2019-03-31: 30 mg via INTRAVENOUS

## 2019-03-31 MED ORDER — LIDOCAINE HCL (PF) 2 % IJ SOLN
INTRAMUSCULAR | Status: DC | PRN
  Administered 2019-03-31: 80 mg

## 2019-03-31 MED ORDER — SODIUM CHLORIDE 0.9 % IV SOLN
INTRAVENOUS | Status: DC
  Administered 2019-03-31: 1000 mL via INTRAVENOUS

## 2019-03-31 MED ORDER — FENTANYL CITRATE (PF) 100 MCG/2ML IJ SOLN
INTRAMUSCULAR | Status: AC
  Filled 2019-03-31: qty 2

## 2019-03-31 MED ORDER — MIDAZOLAM HCL 2 MG/2ML IJ SOLN
INTRAMUSCULAR | Status: AC
  Filled 2019-03-31: qty 2

## 2019-03-31 MED ORDER — FENTANYL CITRATE (PF) 100 MCG/2ML IJ SOLN
INTRAMUSCULAR | Status: DC | PRN
  Administered 2019-03-31 (×2): 50 ug via INTRAVENOUS

## 2019-03-31 MED ORDER — MIDAZOLAM HCL 5 MG/5ML IJ SOLN
INTRAMUSCULAR | Status: DC | PRN
  Administered 2019-03-31: 2 mg via INTRAVENOUS

## 2019-03-31 MED ORDER — LIDOCAINE HCL (PF) 2 % IJ SOLN
INTRAMUSCULAR | Status: AC
  Filled 2019-03-31: qty 10

## 2019-03-31 MED ORDER — PROPOFOL 500 MG/50ML IV EMUL
INTRAVENOUS | Status: AC
  Filled 2019-03-31: qty 50

## 2019-03-31 NOTE — H&P (Signed)
Keith Bellows, MD [address] [place], [place], [place], [address] [address], [place], [place], [place], [address] Phone: [phone]  Fax: [phone]  Primary Care Physician:  Birdie Sons, MD   Pre-Procedure History & Physical: HPI:  Keith Carpenter is a 63 y.o. male is here for an endoscopy and colonoscopy    Past Medical History:  Diagnosis Date  . Collagen vascular disease ([place])    RA diagnosed in his 73's  . Diabetes mellitus without complication ([place])   . Gastric ulcer   . H/O Guillain-Barre syndrome   . Helicobacter pylori gastritis treated 07/2017  . Hypertension   . Hypocalcemia   . Panic disorder   . Schamberg disease   . Thoracic aortic aneurysm ([place]) 04/2014   4.4 cm [facility] at [facility]  . Thyroid cancer ([place]) 2009    Past Surgical History:  Procedure Laterality Date  . CARDIAC CATHETERIZATION  06/07/2014   [facility]  . COLONOSCOPY    . MENISCUS REPAIR Right   . THYROIDECTOMY  2009    Prior to Admission medications   Medication Sig Start Date End Date Taking? Authorizing Provider  amLODipine (NORVASC) 5 MG tablet TAKE ONE (1) TABLET EACH DAY 05/20/18  Yes Birdie Sons, MD  atorvastatin (LIPITOR) 10 MG tablet TAKE 1 TABLET BY MOUTH ONCE A DAY EVERY EVENING 03/30/19  Yes Birdie Sons, MD  CUTIVATE 0.05 % LOTN APPLY TO AFFECTED AREA TWICE A DAY AS NEEDED FOR RASH 02/04/16  Yes Margarita Rana, MD  DULoxetine (CYMBALTA) 30 MG capsule Take 1 capsule (30 mg total) by mouth daily. 09/01/18  Yes Birdie Sons, MD  EUCRISA 2 % OINT  11/02/18  Yes [provider]  HYDROcodone-acetaminophen (NORCO/VICODIN) 5-325 MG tablet Take 1 tablet by mouth 2 (two) times daily as needed for moderate pain. 01/02/19  Yes Birdie Sons, MD  levothyroxine (SYNTHROID, LEVOTHROID) 125 MCG tablet  03/03/18  Yes [provider]  metoprolol tartrate (LOPRESSOR) 50 MG tablet TAKE 1 TABLET BY MOUTH TWICE A DAY 01/12/19  Yes Birdie Sons, MD   mupirocin ointment (BACTROBAN) 2 % Apply 1 application topically 2 (two) times daily. 11/28/18  Yes Birdie Sons, MD  omeprazole (PRILOSEC) 20 MG capsule TAKE 1 CAPSULE (20 MG TOTAL) BY MOUTH DAILY 03/23/19  Yes Birdie Sons, MD  simethicone (MYLICON) 563 MG chewable tablet Chew 125 mg by mouth every 6 (six) hours as needed for flatulence.   Yes [provider]  TALTZ 80 MG/ML SOAJ  07/14/18  Yes [provider]  triamcinolone cream (KENALOG) 0.1 %  07/22/18  Yes [provider]  zolpidem (AMBIEN) 10 MG tablet TAKE 1-2 TABLETS (10-20MG  TOTAL) BY MOUTH AT BEDTIME AS NEEDED. 03/23/19  Yes Birdie Sons, MD    Allergies as of 03/22/2019 - Review Complete 03/08/2019  Allergen Reaction Noted  . Influenza vaccines Other (See Comments) 09/27/2015  . Ixekizumab Hives 10/14/2018  . Declomycin [demeclocycline]  06/04/2014  . Demeclocycline hcl    . Ginger  06/04/2014  . Glipizide  01/04/2017  . Metformin and related Nausea Only 08/18/2016  . Other    . Remicade [infliximab]    . Suvorexant Other (See Comments) 07/12/2015  . Clarithromycin Rash 08/06/2017  . Pioglitazone Itching, Swelling, and Rash 01/14/2017    Family History  Problem Relation Age of Onset  . Hyperlipidemia Mother   . Heart attack Father 49  . Hypertension Father   . Hyperlipidemia Father   .  Heart failure Father   . Prostate cancer Neg Hx   . Kidney cancer Neg Hx   . Bladder Cancer Neg Hx   . Kidney disease Neg Hx     Social History   Socioeconomic History  . Marital status: Divorced    Spouse name: Not on file  . Number of children: Not on file  . Years of education: Not on file  . Highest education level: Not on file  Occupational History  . Not on file  Social Needs  . Financial resource strain: Not on file  . Food insecurity    Worry: Not on file    Inability: Not on file  . Transportation needs    Medical: Not on file    Non-medical: Not on file  Tobacco Use  .  Smoking status: Never Smoker  . Smokeless tobacco: Never Used  Substance and Sexual Activity  . Alcohol use: No  . Drug use: No  . Sexual activity: Not on file  Lifestyle  . Physical activity    Days per week: Not on file    Minutes per session: Not on file  . Stress: Not on file  Relationships  . Social Herbalist on phone: Not on file    Gets together: Not on file    Attends religious service: Not on file    Active member of club or organization: Not on file    Attends meetings of clubs or organizations: Not on file    Relationship status: Not on file  . Intimate partner violence    Fear of current or ex partner: Not on file    Emotionally abused: Not on file    Physically abused: Not on file    Forced sexual activity: Not on file  Other Topics Concern  . Not on file  Social History Narrative  . Not on file    Review of Systems: See HPI, otherwise negative ROS  Physical Exam: BP (!) 151/99   Pulse (!) 106   Temp (!) 96.8 F (36 C) (Tympanic)   Resp 18   Ht 5\' 11"  (1.803 m)   Wt 95.3 kg   SpO2 96%   BMI 29.29 kg/m  General:   Alert,  pleasant and cooperative in NAD Head:  Normocephalic and atraumatic. Neck:  Supple; no masses or thyromegaly. Lungs:  Clear throughout to auscultation, normal respiratory effort.    Heart:  +S1, +S2, Regular rate and rhythm, No edema. Abdomen:  Soft, nontender and nondistended. Normal bowel sounds, without guarding, and without rebound.   Neurologic:  Alert and  oriented x4;  grossly normal neurologically.  Impression/Plan: Keith Carpenter is here for an endoscopy and colonoscopy  to be performed for  evaluation of abdominal pain and diverticulitis .     Risks, benefits, limitations, and alternatives regarding endoscopy have been reviewed with the patient.  Questions have been answered.  All parties agreeable.   Keith Bellows, MD  03/31/2019, 10:07 AM

## 2019-03-31 NOTE — Op Note (Signed)
[facility] Gastroenterology Patient Name: Keith Carpenter Procedure Date: 03/31/2019 10:00 AM MRN: [id number] Account #: [id number] Date of Birth: 03/10/1956 Admit Type: Outpatient Age: 63 Room: [facility] ENDO ROOM 3 Gender: Male Note Status: Finalized Procedure:            Colonoscopy Indications:          Diarrhea Providers:            [name] MD, MD Medicines:            Monitored Anesthesia Care Complications:        No immediate complications. Procedure:            Pre-Anesthesia Assessment:                       - Prior to the procedure, a History and Physical was                        performed, and patient medications, allergies and                        sensitivities were reviewed. The patient's tolerance of                        previous anesthesia was reviewed.                       - The risks and benefits of the procedure and the                        sedation options and risks were discussed with the                        patient. All questions were answered and informed                        consent was obtained.                       - ASA Grade Assessment: II - A patient with mild                        systemic disease.                       After obtaining informed consent, the colonoscope was                        passed under direct vision. Throughout the procedure,                        the patient's blood pressure, pulse, and oxygen                        saturations were monitored continuously. The                        Colonoscope was introduced through the anus and                        advanced to the the terminal ileum. The colonoscopy was  performed with ease. The patient tolerated the                        procedure well. The quality of the bowel preparation                        was excellent. Findings:      The perianal and digital rectal examinations were normal.      The terminal ileum appeared  normal. Biopsies were taken with a cold       forceps for histology.      Two sessile polyps were found in the cecum. The polyps were 4 to 6 mm in       size. These polyps were removed with a cold snare. Resection and       retrieval were complete.      A 12 mm polyp was found in the proximal ascending colon. The polyp was       semi-pedunculated. The polyp was removed with a hot snare. Resection and       retrieval were complete.      The exam was otherwise without abnormality on direct and retroflexion       views.      Normal mucosa was found in the entire colon. Biopsies for histology were       taken with a cold forceps for evaluation of microscopic colitis.      The exam was otherwise without abnormality on direct and retroflexion       views. Impression:           - The examined portion of the ileum was normal.                        Biopsied.                       - Two 4 to 6 mm polyps in the cecum, removed with a                        cold snare. Resected and retrieved.                       - One 12 mm polyp in the proximal ascending colon,                        removed with a hot snare. Resected and retrieved.                       - The examination was otherwise normal on direct and                        retroflexion views.                       - Normal mucosa in the entire examined colon. Biopsied.                       - The examination was otherwise normal on direct and                        retroflexion views. Recommendation:       - Discharge patient to home (with escort).                       -  Resume previous diet.                       - Continue present medications.                       - Await pathology results.                       - Repeat colonoscopy in 3 years for surveillance.                       - Return to GI office as previously scheduled. Procedure Code(s):    --- Professional ---                       [phone], Colonoscopy, flexible; with  removal of tumor(s),                        polyp(s), or other lesion(s) by snare technique                       [id number], 71, Colonoscopy, flexible; with biopsy, single                        or multiple Diagnosis Code(s):    --- Professional ---                       K63.5, Polyp of colon                       R19.7, Diarrhea, unspecified CPT copyright 2019 American Medical Association. All rights reserved. The codes documented in this report are preliminary and upon coder review may  be revised to meet current compliance requirements. [name], MD [name] MD, MD 03/31/2019 10:49:04 AM This report has been signed electronically. Number of Addenda: 0 Note Initiated On: 03/31/2019 10:00 AM Scope Withdrawal Time: 0 hours 22 minutes 54 seconds  Total Procedure Duration: 0 hours 26 minutes 0 seconds  Estimated Blood Loss: Estimated blood loss: none.      [facility]

## 2019-03-31 NOTE — Anesthesia Preprocedure Evaluation (Addendum)
Anesthesia Evaluation  Patient identified by MRN, date of birth, ID band Patient awake    Reviewed: Allergy & Precautions, H&P , NPO status , Patient's Chart, lab work & pertinent test results  History of Anesthesia Complications (+) history of anesthetic complications  Airway Mallampati: III  TM Distance: <3 FB Neck ROM: limited    Dental  (+) Chipped   Pulmonary neg pulmonary ROS, neg shortness of breath,           Cardiovascular Exercise Tolerance: Good hypertension, (-) angina(-) Past MI and (-) DOE + dysrhythmias      Neuro/Psych  Headaches, PSYCHIATRIC DISORDERS  Neuromuscular disease negative psych ROS   GI/Hepatic Neg liver ROS, PUD, GERD  Medicated and Controlled,  Endo/Other  diabetes, Type 2Hypothyroidism   Renal/GU negative Renal ROS  negative genitourinary   Musculoskeletal  (+) Arthritis ,   Abdominal   Peds  Hematology negative hematology ROS (+)   Anesthesia Other Findings Past Medical History: No date: Collagen vascular disease ([place])     Comment:  RA diagnosed in his 42's No date: Diabetes mellitus without complication ([place]) No date: Gastric ulcer No date: H/O Guillain-Barre syndrome treated 00/3491: Helicobacter pylori gastritis No date: Hypertension No date: Hypocalcemia No date: Panic disorder No date: Schamberg disease 04/2014: Thoracic aortic aneurysm ([facility])     Comment:  4.4 cm followed at [facility] 2009: Thyroid cancer [facility])  Past Surgical History: 06/07/2014: CARDIAC CATHETERIZATION     Comment:  [facility] No date: COLONOSCOPY No date: MENISCUS REPAIR; Right 2009: THYROIDECTOMY  BMI    Body Mass Index: 29.29 kg/m      Reproductive/Obstetrics negative OB ROS                            Anesthesia Physical Anesthesia Plan  ASA: III  Anesthesia Plan: General   Post-op Pain Management:    Induction: Intravenous  PONV Risk Score and Plan: Propofol infusion  and TIVA  Airway Management Planned: Natural Airway and Nasal Cannula  Additional Equipment:   Intra-op Plan:   Post-operative Plan:   Informed Consent: I have reviewed the patients History and Physical, chart, labs and discussed the procedure including the risks, benefits and alternatives for the proposed anesthesia with the patient or authorized representative who has indicated his/her understanding and acceptance.     Dental Advisory Given  Plan Discussed with: Anesthesiologist, CRNA and Surgeon  Anesthesia Plan Comments: (Patient consented for risks of anesthesia including but not limited to:  - Possible Guillain-Barre flare as he reports having a flare with a past surgery  - adverse reactions to medications - risk of intubation if required - damage to teeth, lips or other oral mucosa - sore throat or hoarseness - Damage to heart, brain, lungs or loss of life  Patient voiced understanding.)       Anesthesia Quick Evaluation

## 2019-03-31 NOTE — Op Note (Signed)
[facility] Gastroenterology Patient Name: Keith Carpenter Procedure Date: 03/31/2019 10:00 AM MRN: [id number] Account #: [id number] Date of Birth: 26-Jul-1956 Admit Type: Outpatient Age: 63 Room: [facility] ENDO ROOM 3 Gender: Male Note Status: Finalized Procedure:            Upper GI endoscopy Indications:          Generalized abdominal pain Providers:            [name] MD, MD Medicines:            Monitored Anesthesia Care Complications:        No immediate complications. Procedure:            Pre-Anesthesia Assessment:                       - Prior to the procedure, a History and Physical was                        performed, and patient medications, allergies and                        sensitivities were reviewed. The patient's tolerance of                        previous anesthesia was reviewed.                       - The risks and benefits of the procedure and the                        sedation options and risks were discussed with the                        patient. All questions were answered and informed                        consent was obtained.                       - ASA Grade Assessment: II - A patient with mild                        systemic disease.                       After obtaining informed consent, the endoscope was                        passed under direct vision. Throughout the procedure,                        the patient's blood pressure, pulse, and oxygen                        saturations were monitored continuously. The Endoscope                        was introduced through the mouth, and advanced to the                        third part of duodenum. The upper GI endoscopy was  accomplished with ease. The patient tolerated the                        procedure well. Findings:      The esophagus was normal.      The entire examined stomach was normal. Biopsies were taken with a cold       forceps for  histology.      The examined duodenum was normal. Biopsies for histology were taken with       a cold forceps for evaluation of celiac disease.      The cardia and gastric fundus were normal on retroflexion. Impression:           - Normal esophagus.                       - Normal stomach. Biopsied.                       - Normal examined duodenum. Biopsied. Recommendation:       - Await pathology results.                       - Perform a colonoscopy today. Procedure Code(s):    --- Professional ---                       [phone], Esophagogastroduodenoscopy, flexible, transoral;                        with biopsy, single or multiple Diagnosis Code(s):    --- Professional ---                       R10.84, Generalized abdominal pain CPT copyright 2019 American Medical Association. All rights reserved. The codes documented in this report are preliminary and upon coder review may  be revised to meet current compliance requirements. [name], MD [name] MD, MD 03/31/2019 10:18:51 AM This report has been signed electronically. Number of Addenda: 0 Note Initiated On: 03/31/2019 10:00 AM Estimated Blood Loss: Estimated blood loss: none.      [facility]

## 2019-03-31 NOTE — Transfer of Care (Signed)
Immediate Anesthesia Transfer of Care Note  Patient: Keith Carpenter  Procedure(s) Performed: COLONOSCOPY WITH PROPOFOL (N/A ) ESOPHAGOGASTRODUODENOSCOPY (EGD) WITH PROPOFOL (N/A )  Patient Location: PACU  Anesthesia Type:General  Level of Consciousness: sedated  Airway & Oxygen Therapy: Patient Spontanous Breathing and Patient connected to nasal cannula oxygen  Post-op Assessment: Report given to RN and Post -op Vital signs reviewed and stable  Post vital signs: Reviewed and stable  Last Vitals:  Vitals Value Taken Time  BP    Temp    Pulse    Resp    SpO2      Last Pain:  Vitals:   03/31/19 0906  TempSrc: Tympanic  PainSc: 0-No pain         Complications: No apparent anesthesia complications

## 2019-03-31 NOTE — Anesthesia Post-op Follow-up Note (Signed)
Anesthesia QCDR form completed.        

## 2019-03-31 NOTE — Anesthesia Postprocedure Evaluation (Signed)
Anesthesia Post Note  Patient: Dacian Orrico  Procedure(s) Performed: COLONOSCOPY WITH PROPOFOL (N/A ) ESOPHAGOGASTRODUODENOSCOPY (EGD) WITH PROPOFOL (N/A )  Patient location during evaluation: Endoscopy Anesthesia Type: General Level of consciousness: awake and alert Pain management: pain level controlled Vital Signs Assessment: post-procedure vital signs reviewed and stable Respiratory status: spontaneous breathing, nonlabored ventilation, respiratory function stable and patient connected to nasal cannula oxygen Cardiovascular status: blood pressure returned to baseline and stable Postop Assessment: no apparent nausea or vomiting Anesthetic complications: no     Last Vitals:  Vitals:   03/31/19 1100 03/31/19 1110  BP: (!) 144/93 (!) 144/81  Pulse: 75 75  Resp: 19 15  Temp:    SpO2: 100% 99%    Last Pain:  Vitals:   03/31/19 1110  TempSrc:   PainSc: 0-No pain                 Precious Haws Mercedez Boule

## 2019-04-03 ENCOUNTER — Encounter: Payer: Self-pay | Admitting: Family Medicine

## 2019-04-03 ENCOUNTER — Encounter: Payer: Self-pay | Admitting: Gastroenterology

## 2019-04-03 DIAGNOSIS — Z86010 Personal history of colonic polyps: Secondary | ICD-10-CM | POA: Insufficient documentation

## 2019-04-03 LAB — SURGICAL PATHOLOGY

## 2019-04-04 ENCOUNTER — Ambulatory Visit: Payer: BC Managed Care – PPO

## 2019-04-09 ENCOUNTER — Encounter: Payer: Self-pay | Admitting: Gastroenterology

## 2019-04-20 ENCOUNTER — Other Ambulatory Visit: Payer: Self-pay

## 2019-04-20 ENCOUNTER — Ambulatory Visit: Payer: BC Managed Care – PPO | Admitting: Gastroenterology

## 2019-04-20 ENCOUNTER — Encounter: Payer: Self-pay | Admitting: Gastroenterology

## 2019-04-20 VITALS — BP 143/82 | HR 63 | Temp 97.5°F | Ht 71.0 in | Wt 216.0 lb

## 2019-04-20 DIAGNOSIS — R197 Diarrhea, unspecified: Secondary | ICD-10-CM | POA: Diagnosis not present

## 2019-04-20 DIAGNOSIS — R109 Unspecified abdominal pain: Secondary | ICD-10-CM

## 2019-04-20 MED ORDER — DICYCLOMINE HCL 10 MG PO CAPS: 10.0000 mg | ORAL_CAPSULE | Freq: Three times a day (TID) | ORAL | 0 refills | Status: DC

## 2019-04-20 NOTE — Progress Notes (Signed)
[name] MD, MRCP(U.K) [address]  [place]  [place], [place] [address]  Main: [phone]  Fax: [phone]   Primary Care Physician: Birdie Sons, MD  Primary Gastroenterologist:  Dr. [name]   Abdominal pain.  HPI: Keith Carpenter is a 63 y.o. male    Summary of history :  He is here today to follow-up for his abdominal pain.   He was initially seen by myself in January 2019 for abdominal pain.  Treated for H. pylori antibody back in 08/03/2017.  Eradication confirmed.  He developed diverticulitis of the descending colon in December 2018.  Was treated with 2 courses of antibiotics.  At his initial visit back in 2019 he has had abdominal pain since October 2018.  Left side of the abdomen and after treatment with diverticulitis seem to help.  A few weeks later the pain recurred.  Localized in nature.  Sharp and dull in description.  At that point of time he had lost 30 pounds of weight in over 4 months.  At that point of time he also had gas bloating abdominal distention and change in bowel habits and relief of the pain with bowel movements.In January 2019 he also had C. difficile diarrhea.  He was referred for his inguinal hernias to surgery.  At that point of time he mentioned to Dr. Bary Castilla that he had abdominal pain.  He underwent right upper quadrant ultrasound in December 2019 which was normal.  Subsequently was followed up by a HIDA scan.  HIDA scan was within normal limits.    Did not go through surgery.   10/19/2017: CT scan of the abdomen: Resolution of diverticulitis.  Bilateral inguinal hernias containing fat with stranding identified within the left inguinal sac.  Mild prostatic enlargement involving the central lobe.  Interval history    03/08/2019-04/20/2019  03/09/2019: Stool for C. difficile and GI PCR negative.  03/31/2019: EGD normal appearance.  Biopsies of the duodenum showed focal mild active inflammation.  Biopsies of the stomach showed  nonspecific gastritis and negative for H. pylori.  Colonoscopy showed 3 polyps which were sessile serrated adenomas and a tubular adenoma.  Dysplasia noted.  Terminal ileum biopsies were negative for inflammation.  Random colon biopsies were negative for colitis.  Since his last visit he states that the diarrhea has completely resolved.  His only issue is some pain/discomfort in the left upper quadrant below his ribs right after he has a meal.  This makes him feel like he wants to have a bowel movement.  Denies any other complaint.     Current Outpatient Medications  Medication Sig Dispense Refill  . amLODipine (NORVASC) 5 MG tablet TAKE ONE (1) TABLET EACH DAY 30 tablet 12  . atorvastatin (LIPITOR) 10 MG tablet TAKE 1 TABLET BY MOUTH ONCE A DAY EVERY EVENING 90 tablet 4  . CUTIVATE 0.05 % LOTN APPLY TO AFFECTED AREA TWICE A DAY AS NEEDED FOR RASH 120 mL 5  . DULoxetine (CYMBALTA) 30 MG capsule Take 1 capsule (30 mg total) by mouth daily. 30 capsule 11  . EUCRISA 2 % OINT     . HYDROcodone-acetaminophen (NORCO/VICODIN) 5-325 MG tablet Take 1 tablet by mouth 2 (two) times daily as needed for moderate pain. 60 tablet 0  . levothyroxine (SYNTHROID, LEVOTHROID) 125 MCG tablet   3  . metoprolol tartrate (LOPRESSOR) 50 MG tablet TAKE 1 TABLET BY MOUTH TWICE A DAY 60 tablet 11  . mupirocin ointment (BACTROBAN) 2 % Apply 1  application topically 2 (two) times daily. 22 g 0  . omeprazole (PRILOSEC) 20 MG capsule TAKE 1 CAPSULE (20 MG TOTAL) BY MOUTH DAILY 30 capsule 12  . simethicone (MYLICON) 315 MG chewable tablet Chew 125 mg by mouth every 6 (six) hours as needed for flatulence.    Marland Kitchen TALTZ 80 MG/ML SOAJ     . triamcinolone cream (KENALOG) 0.1 %   2  . zolpidem (AMBIEN) 10 MG tablet TAKE 1-2 TABLETS (10-20MG  TOTAL) BY MOUTH AT BEDTIME AS NEEDED. 60 tablet 5   No current facility-administered medications for this visit.     Allergies as of 04/20/2019 - Review Complete 03/31/2019  Allergen  Reaction Noted  . Influenza vaccines Other (See Comments) 09/27/2015  . Ixekizumab Hives 10/14/2018  . Declomycin [demeclocycline]  06/04/2014  . Demeclocycline hcl    . Ginger  06/04/2014  . Glipizide  01/04/2017  . Metformin and related Nausea Only 08/18/2016  . Other    . Remicade [infliximab]    . Suvorexant Other (See Comments) 07/12/2015  . Clarithromycin Rash 08/06/2017  . Pioglitazone Itching, Swelling, and Rash 01/14/2017    ROS:  General: Negative for anorexia, weight loss, fever, chills, fatigue, weakness. ENT: Negative for hoarseness, difficulty swallowing , nasal congestion. CV: Negative for chest pain, angina, palpitations, dyspnea on exertion, peripheral edema.  Respiratory: Negative for dyspnea at rest, dyspnea on exertion, cough, sputum, wheezing.  GI: See history of present illness. GU:  Negative for dysuria, hematuria, urinary incontinence, urinary frequency, nocturnal urination.  Endo: Negative for unusual weight change.    Physical Examination:   There were no vitals taken for this visit.  General: Well-nourished, well-developed in no acute distress.  Eyes: No icterus. Conjunctivae pink. Mouth: Oropharyngeal mucosa moist and pink , no lesions erythema or exudate. Lungs: Clear to auscultation bilaterally. Non-labored. Heart: Regular rate and rhythm, no murmurs rubs or gallops.  Abdomen: Bowel sounds are normal, nontender, nondistended, no hepatosplenomegaly or masses, no abdominal bruits or hernia , no rebound or guarding.   Extremities: No lower extremity edema. No clubbing or deformities. Neuro: Alert and oriented x 3.  Grossly intact. Skin: Warm and dry, no jaundice.   Psych: Alert and cooperative, normal mood and affect.   Imaging Studies: No results found.  Assessment and Plan:   Keith Carpenter is a 63 y.o. y/o male here to follow-up for abdominal pain.  He has a history of C. difficile diarrhea and treated.  He has had 2 episodes of  diverticulitis.  Today his main issue is slight discomfort in the left upper quadrant after meals.  Which I suspect is due to colonic spasm postprandial.  Plan 1.    Colonoscopy in 3 years due to sessile serrated adenoma with dysplasia 2.  Suggest Bentyl 10 mg 3 times daily before each meal and IBgard to be taken as needed as a counter irritant samples of which will be provided. 3.  If no better at his next visit we will consider trial of amitriptyline.  Dr [name]  MD,MRCP [facility]) Follow up in 3 months

## 2019-05-29 ENCOUNTER — Other Ambulatory Visit: Payer: Self-pay | Admitting: Gastroenterology

## 2019-06-16 ENCOUNTER — Other Ambulatory Visit: Payer: Self-pay | Admitting: Family Medicine

## 2019-06-16 DIAGNOSIS — I10 Essential (primary) hypertension: Secondary | ICD-10-CM

## 2019-07-14 ENCOUNTER — Ambulatory Visit: Payer: Self-pay | Admitting: Family Medicine

## 2019-07-17 ENCOUNTER — Encounter: Payer: Self-pay | Admitting: Family Medicine

## 2019-07-17 ENCOUNTER — Other Ambulatory Visit: Payer: Self-pay

## 2019-07-17 ENCOUNTER — Ambulatory Visit: Payer: BC Managed Care – PPO | Admitting: Family Medicine

## 2019-07-17 VITALS — BP 146/76 | HR 70 | Temp 97.1°F | Wt 219.2 lb

## 2019-07-17 DIAGNOSIS — E039 Hypothyroidism, unspecified: Secondary | ICD-10-CM | POA: Diagnosis not present

## 2019-07-17 DIAGNOSIS — E119 Type 2 diabetes mellitus without complications: Secondary | ICD-10-CM | POA: Diagnosis not present

## 2019-07-17 DIAGNOSIS — I10 Essential (primary) hypertension: Secondary | ICD-10-CM

## 2019-07-17 NOTE — Progress Notes (Signed)
Patient: Keith Carpenter Male    DOB: Oct 24, 1955   63 y.o.   MRN: [id number] Visit Date: 07/17/2019  Today's Provider: Lelon Huh, MD   Chief Complaint  Patient presents with  . Hypertension  . Diabetes  . Depression  . Hypothyroidism   Subjective:     HPI  Hypertension, follow-up:  BP Readings from Last 3 Encounters:  07/17/19 (!) 146/76  04/20/19 (!) 143/82  03/31/19 (!) 144/81    He was last seen for hypertension 4 months ago.  BP at that visit was 138/72. Management since that visit includes no changes. He reports good compliance with treatment. He is not having side effects.  He is not exercising. He is adherent to low salt diet.   Outside blood pressures are being checked at home and at work and reports it usually runsin the 130s/80's He is experiencing none.  Patient denies chest pain, chest pressure/discomfort, claudication, dyspnea, exertional chest pressure/discomfort, fatigue, irregular heart beat, lower extremity edema, near-syncope, orthopnea, palpitations, paroxysmal nocturnal dyspnea, syncope and tachypnea.   Cardiovascular risk factors include advanced age (older than 34 for men, 41 for women), diabetes mellitus, dyslipidemia, hypertension and male gender.  Use of agents associated with hypertension: thyroid hormones.     Weight trend: stable Wt Readings from Last 3 Encounters:  07/17/19 219 lb 3.2 oz (99.4 kg)  04/20/19 216 lb (98 kg)  03/31/19 210 lb (95.3 kg)    Current diet: balanced  ------------------------------------------------------------------------  Diabetes Mellitus Type II, Follow-up:   Lab Results  Component Value Date   HGBA1C 6.6 (A) 03/03/2019   HGBA1C 6.8 (A) 08/19/2018   HGBA1C 6.7 (A) 04/07/2018    Last seen for diabetes 4 months ago.  Management since then includes no changes. He reports good compliance with treatment. He is not having side effects.  Current symptoms include none  Home blood sugar  records: fasting range: fluctuating   Episodes of hypoglycemia? yes - couple times    Current insulin regiment: Is not on insulin Most Recent Eye Exam: >1 year ago Weight trend: stable Prior visit with dietician: No Current exercise: none Current diet habits: balanced  Pertinent Labs:    Component Value Date/Time   CHOL 116 03/03/2019 0944   CHOL 220 (H) 02/23/2012 0416   TRIG 65 03/03/2019 0944   TRIG 104 02/23/2012 0416   HDL 42 03/03/2019 0944   HDL 36 (L) 02/23/2012 0416   LDLCALC 61 03/03/2019 0944   LDLCALC 163 (H) 02/23/2012 0416   CREATININE 0.97 03/03/2019 0944   CREATININE 0.93 07/20/2017 1135    Wt Readings from Last 3 Encounters:  07/17/19 219 lb 3.2 oz (99.4 kg)  04/20/19 216 lb (98 kg)  03/31/19 210 lb (95.3 kg)    ------------------------------------------------------------------------  Follow up for Depression:  The patient was last seen for this 4 months ago. Changes made at last visit include none.  He reports good compliance with treatment. He feels that condition is stable. He is not having side effects.   ------------------------------------------------------------------------------------  Follow up for Hypothyroidism:  The patient was last seen for this 4 months ago. Changes made at last visit include none.  He reports good compliance with treatment. He feels that condition is stable. He is not having side effects.   ------------------------------------------------------------------------------------  Allergies  Allergen Reactions  . Influenza Vaccines Other (See Comments)    Guillain Barre  . Ixekizumab Hives    Rashes, swollen eyes, heat in facial region  .  Declomycin [Demeclocycline]   . Demeclocycline Hcl   . Ginger   . Glipizide     Nausea   . Metformin And Related Nausea Only    Metformin IR 500 BID  . Other     Other reaction(s): Other (See Comments) Uncoded Allergy. Allergen: Other Allergy: See Patient Chart for  Details, Other Reaction: Other reaction  . Remicade [Infliximab]   . Suvorexant Other (See Comments)    vision changes  . Clarithromycin Rash  . Pioglitazone Itching, Swelling and Rash     Current Outpatient Medications:  .  amLODipine (NORVASC) 5 MG tablet, TAKE 1 TABLET BY MOUTH DAILY, Disp: 30 tablet, Rfl: 12 .  atorvastatin (LIPITOR) 10 MG tablet, TAKE 1 TABLET BY MOUTH ONCE A DAY EVERY EVENING, Disp: 90 tablet, Rfl: 4 .  CUTIVATE 0.05 % LOTN, APPLY TO AFFECTED AREA TWICE A DAY AS NEEDED FOR RASH, Disp: 120 mL, Rfl: 5 .  dicyclomine (BENTYL) 10 MG capsule, TAKE 1 CAPSULE BY MOUTH 4 TIMES DAILY BEFORE MEALS AND AT BEDTIME, Disp: 120 capsule, Rfl: 2 .  DULoxetine (CYMBALTA) 30 MG capsule, Take 1 capsule (30 mg total) by mouth daily., Disp: 30 capsule, Rfl: 11 .  EUCRISA 2 % OINT, , Disp: , Rfl:  .  HYDROcodone-acetaminophen (NORCO/VICODIN) 5-325 MG tablet, Take 1 tablet by mouth 2 (two) times daily as needed for moderate pain., Disp: 60 tablet, Rfl: 0 .  levothyroxine (SYNTHROID, LEVOTHROID) 125 MCG tablet, , Disp: , Rfl: 3 .  metoprolol tartrate (LOPRESSOR) 50 MG tablet, TAKE 1 TABLET BY MOUTH TWICE A DAY, Disp: 60 tablet, Rfl: 11 .  mupirocin ointment (BACTROBAN) 2 %, Apply 1 application topically 2 (two) times daily., Disp: 22 g, Rfl: 0 .  omeprazole (PRILOSEC) 20 MG capsule, TAKE 1 CAPSULE (20 MG TOTAL) BY MOUTH DAILY, Disp: 30 capsule, Rfl: 12 .  simethicone (MYLICON) 0000000 MG chewable tablet, Chew 125 mg by mouth every 6 (six) hours as needed for flatulence., Disp: , Rfl:  .  TALTZ 80 MG/ML SOAJ, , Disp: , Rfl:  .  triamcinolone cream (KENALOG) 0.1 %, , Disp: , Rfl: 2 .  zolpidem (AMBIEN) 10 MG tablet, TAKE 1-2 TABLETS (10-20MG  TOTAL) BY MOUTH AT BEDTIME AS NEEDED., Disp: 60 tablet, Rfl: 5  Review of Systems  Constitutional: Negative for appetite change, chills and fever.  Respiratory: Negative for chest tightness, shortness of breath and wheezing.   Cardiovascular: Negative for  chest pain and palpitations.  Gastrointestinal: Negative for abdominal pain, nausea and vomiting.    Social History   Tobacco Use  . Smoking status: Never Smoker  . Smokeless tobacco: Never Used  Substance Use Topics  . Alcohol use: No      Objective:   BP (!) 146/76 (BP Location: Right Arm, Patient Position: Sitting, Cuff Size: Normal)   Pulse 70   Temp (!) 97.1 F (36.2 C) (Temporal)   Wt 219 lb 3.2 oz (99.4 kg)   SpO2 97%   BMI 30.57 kg/m  Vitals:   07/17/19 1517  BP: (!) 146/76  Pulse: 70  Temp: (!) 97.1 F (36.2 C)  TempSrc: Temporal  SpO2: 97%  Weight: 219 lb 3.2 oz (99.4 kg)  Body mass index is 30.57 kg/m.   Physical Exam   General Appearance:    Overweight male in no acute distress  Eyes:    PERRL, conjunctiva/corneas clear, EOM's intact       Lungs:     Clear to auscultation bilaterally, respirations unlabored  Heart:  Normal heart rate. Normal rhythm. No murmurs, rubs, or gallops.   MS:   All extremities are intact.   Neurologic:   Awake, alert, oriented x 3. No apparent focal neurological           defect.           Assessment & Plan     1. Type 2 diabetes mellitus without complication, without long-term current use of insulin (HCC) Diet controlled.  - Hemoglobin A1c  2. Hypothyroidism, unspecified type Doing well current thyroid replacement.   3. Essential hypertension BP not to goal, but usually well controlled. Continue current medications.       Lelon Huh, MD  [facility]

## 2019-07-17 NOTE — Patient Instructions (Signed)
.   Please review the attached list of medications and notify my office if there are any errors.   . Please bring all of your medications to every appointment so we can make sure that our medication list is the same as yours.   . It is especially important to get the annual flu vaccine this year. If you haven't had it already, please go to your pharmacy or call the office as soon as possible to schedule you flu shot.  

## 2019-07-18 LAB — HEMOGLOBIN A1C
Est. average glucose Bld gHb Est-mCnc: 151 mg/dL
Hgb A1c MFr Bld: 6.9 % — ABNORMAL HIGH (ref 4.8–5.6)

## 2019-07-20 ENCOUNTER — Encounter: Payer: Self-pay | Admitting: Gastroenterology

## 2019-07-20 ENCOUNTER — Other Ambulatory Visit: Payer: Self-pay

## 2019-07-20 ENCOUNTER — Ambulatory Visit: Payer: BC Managed Care – PPO | Admitting: Gastroenterology

## 2019-07-20 VITALS — BP 128/71 | HR 65 | Temp 98.3°F | Ht 71.0 in | Wt 218.2 lb

## 2019-07-20 DIAGNOSIS — R109 Unspecified abdominal pain: Secondary | ICD-10-CM | POA: Diagnosis not present

## 2019-07-20 NOTE — Progress Notes (Signed)
Keith Bellows MD, MRCP(U.K) [address]  [place]  [place], [place] [address]  Main: [phone]  Fax: [phone]   Primary Care Physician: Birdie Sons, MD  Primary Gastroenterologist:  Dr. Jonathon Carpenter   Follow-up for abdominal pain  HPI: Keith Carpenter is a 63 y.o. male   Summary of history :  He is here today to follow-up for his abdominal pain.   He was initially seen by myself in January 2019 for abdominal pain. Treated for H. pylori antibody back in 08/03/2017. Eradication confirmed. He developed diverticulitis of the descending colon in December 2018. Was treated with 2 courses of antibiotics. In January 2019 he also had C. difficile diarrhea. He was referred for his inguinal hernias to surgery. At that point of time he mentioned to Dr. Bary Castilla that he had abdominal pain. He underwent right upper quadrant ultrasound in December 2019 which was normal. Subsequently was followed up by a HIDA scan. HIDA scan was within normal limits.   Did not go through surgery for hernia.   10/19/2017: CT scan of the abdomen: Resolution of diverticulitis. Bilateral inguinal hernias containing fat with stranding identified within the left inguinal sac. Mild prostatic enlargement involving the central lobe. 03/09/2019: Stool for C. difficile and GI PCR negative.  03/31/2019: EGD normal appearance.  Biopsies of the duodenum showed focal mild active inflammation.  Biopsies of the stomach showed nonspecific gastritis and negative for H. pylori.  Colonoscopy showed 3 polyps which were sessile serrated adenomas and a tubular adenoma.  Dysplasia noted.  Terminal ileum biopsies were negative for inflammation.  Random colon biopsies were negative for colitis.   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  No abdominal pain.  He takes it before meals.  Absolutely has no symptoms and says it  makes a day and night difference. Current Outpatient Medications  Medication Sig Dispense Refill   amLODipine (NORVASC) 5 MG tablet TAKE 1 TABLET BY MOUTH DAILY 30 tablet 12   atorvastatin (LIPITOR) 10 MG tablet TAKE 1 TABLET BY MOUTH ONCE A DAY EVERY EVENING 90 tablet 4   CUTIVATE 0.05 % LOTN APPLY TO AFFECTED AREA TWICE A DAY AS NEEDED FOR RASH 120 mL 5   dicyclomine (BENTYL) 10 MG capsule TAKE 1 CAPSULE BY MOUTH 4 TIMES DAILY BEFORE MEALS AND AT BEDTIME 120 capsule 2   DULoxetine (CYMBALTA) 30 MG capsule Take 1 capsule (30 mg total) by mouth daily. 30 capsule 11   EUCRISA 2 % OINT      HYDROcodone-acetaminophen (NORCO/VICODIN) 5-325 MG tablet Take 1 tablet by mouth 2 (two) times daily as needed for moderate pain. 60 tablet 0   levothyroxine (SYNTHROID, LEVOTHROID) 125 MCG tablet   3   metoprolol tartrate (LOPRESSOR) 50 MG tablet TAKE 1 TABLET BY MOUTH TWICE A DAY 60 tablet 11   mupirocin ointment (BACTROBAN) 2 % Apply 1 application topically 2 (two) times daily. 22 g 0   omeprazole (PRILOSEC) 20 MG capsule TAKE 1 CAPSULE (20 MG TOTAL) BY MOUTH DAILY 30 capsule 12   simethicone (MYLICON) 0000000 MG chewable tablet Chew 125 mg by mouth every 6 (six) hours as needed for flatulence.     TALTZ 80 MG/ML SOAJ      triamcinolone cream (KENALOG) 0.1 %   2   zolpidem (AMBIEN) 10 MG tablet TAKE 1-2 TABLETS (10-20MG  TOTAL) BY MOUTH AT BEDTIME AS NEEDED. 60 tablet 5   No current facility-administered medications for  this visit.     Allergies as of 07/20/2019 - Review Complete 07/17/2019  Allergen Reaction Noted   Influenza vaccines Other (See Comments) 09/27/2015   Ixekizumab Hives 10/14/2018   Declomycin [demeclocycline]  06/04/2014   Demeclocycline hcl     Ginger  06/04/2014   Glipizide  01/04/2017   Metformin and related Nausea Only 08/18/2016   Other     Remicade [infliximab]     Suvorexant Other (See Comments) 07/12/2015   Clarithromycin Rash 08/06/2017    Pioglitazone Itching, Swelling, and Rash 01/14/2017    ROS:  General: Negative for anorexia, weight loss, fever, chills, fatigue, weakness. ENT: Negative for hoarseness, difficulty swallowing , nasal congestion. CV: Negative for chest pain, angina, palpitations, dyspnea on exertion, peripheral edema.  Respiratory: Negative for dyspnea at rest, dyspnea on exertion, cough, sputum, wheezing.  GI: See history of present illness. GU:  Negative for dysuria, hematuria, urinary incontinence, urinary frequency, nocturnal urination.  Endo: Negative for unusual weight change.    Physical Examination:   There were no vitals taken for this visit.  General: Well-nourished, well-developed in no acute distress.  Eyes: No icterus. Conjunctivae pink. Mouth: Oropharyngeal mucosa moist and pink , no lesions erythema or exudate. Lungs: Clear to auscultation bilaterally. Non-labored. Heart: Regular rate and rhythm, no murmurs rubs or gallops.  Abdomen: Bowel sounds are normal, nontender, nondistended, no hepatosplenomegaly or masses, no abdominal bruits or hernia , no rebound or guarding.   Extremities: No lower extremity edema. No clubbing or deformities. Neuro: Alert and oriented x 3.  Grossly intact. Skin: Warm and dry, no jaundice.   Psych: Alert and cooperative, normal mood and affect.   Imaging Studies: No results found.  Assessment and Plan:   Keith Carpenter is a 63 y.o. y/o male  here to follow-up for abdominal pain.  He has a history of C. difficile diarrhea and treated. He has had 2 episodes of diverticulitis.  Today his main issue is slight discomfort in the left upper quadrant after meals.  Which I suspect is due to colonic spasm postprandial.  It has completely responded to dicyclomine.   Dr Keith Bellows  MD,MRCP [facility]) Follow up in as needed

## 2019-09-03 IMAGING — CT CT ABD-PELV W/ CM
2 of 5 series · 16 of 46 positions shown, 18 images · IV contrast (iopamidol)
Comparison: 08/25/2017

CLINICAL DATA: LEFT sided lower abdominal pain and diarrhea for 4
months, history of diverticulitis in August 2017, treated with
antibiotics but still has abdominal pain, past history of thyroid
cancer, diabetes mellitus, hypertension, collagen vascular disease

EXAM:
CT ABDOMEN AND PELVIS WITH CONTRAST
TECHNIQUE: Multidetector CT imaging of the abdomen and pelvis was performed
using the standard protocol following bolus administration of
intravenous contrast. Sagittal and coronal MPR images reconstructed
from axial data set.
CONTRAST:  100mL [id number] IOPAMIDOL ([id number]) INJECTION 61% IV.
Dilute oral contrast.

[Series 2: abd pelvis 5.00 br36 s3 · axial · 0.83mm/px · z∈[-1679,-1234]mm · 13 of 101 slices shown, 15 images]
[im 6/101  soft-tissue]
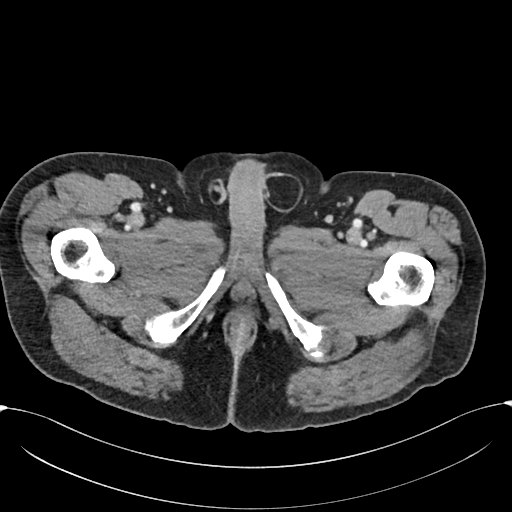
[im 6/101  bone]
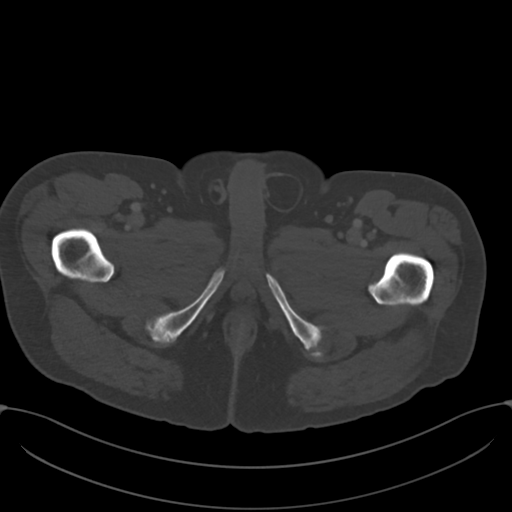
[im 16/101  soft-tissue]
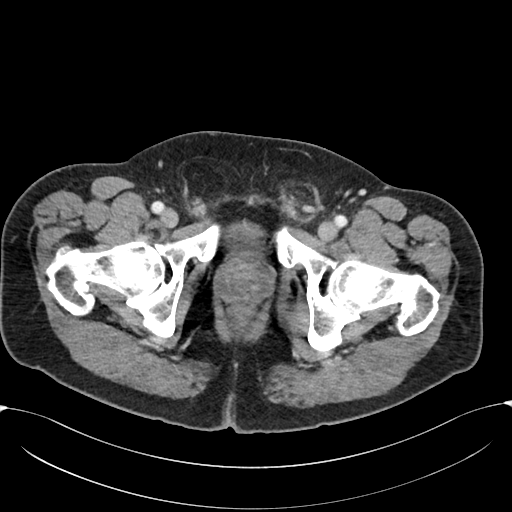
[im 22/101  soft-tissue]
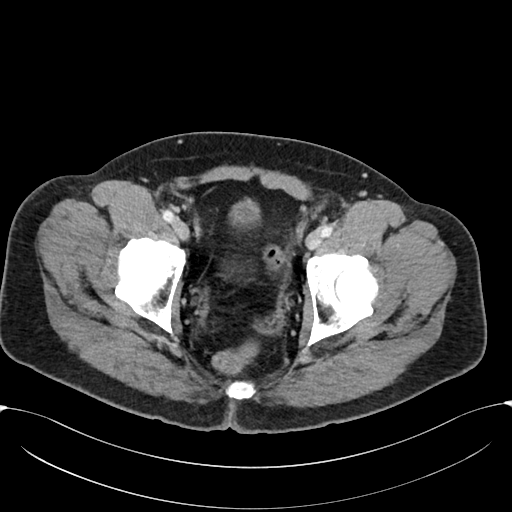
[im 27/101  soft-tissue]
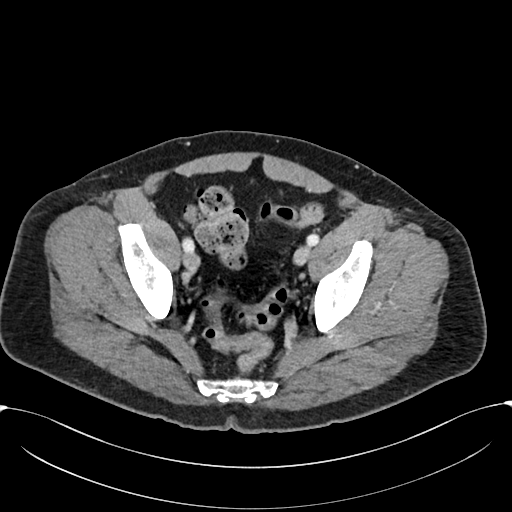
[im 37/101  soft-tissue]
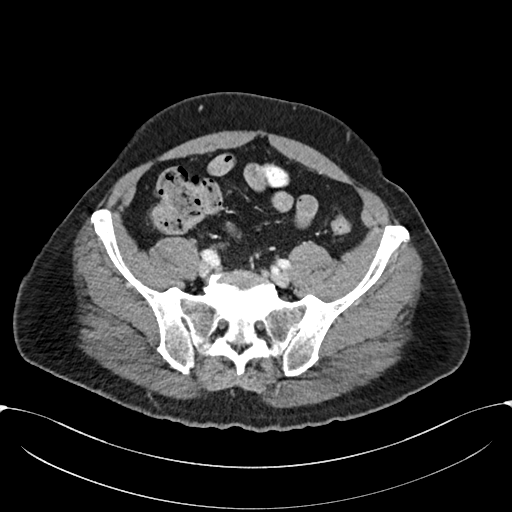
[im 43/101  soft-tissue]
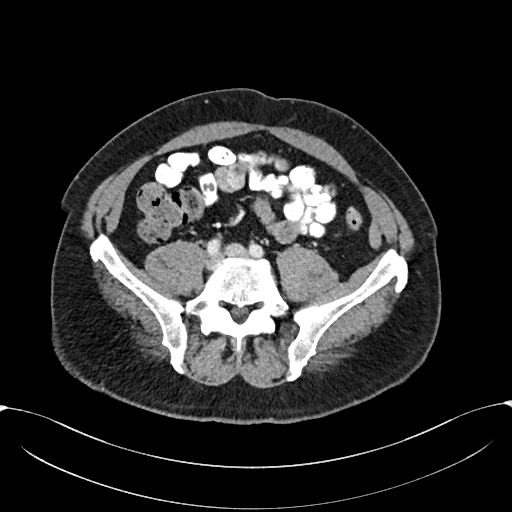
[im 53/101  soft-tissue]
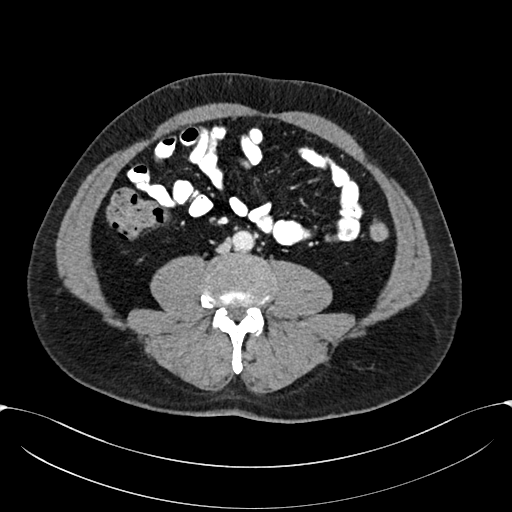
[im 58/101  soft-tissue]
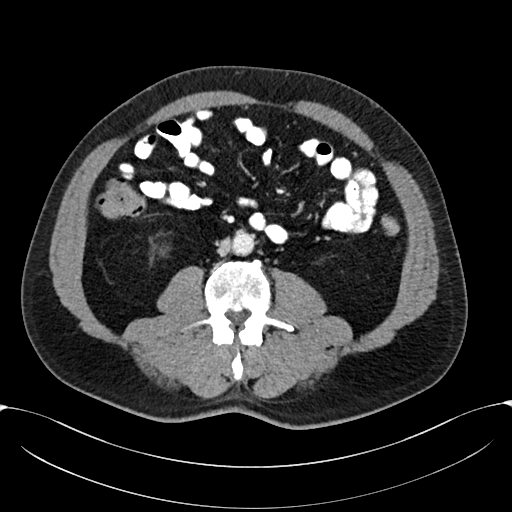
[im 64/101  soft-tissue]
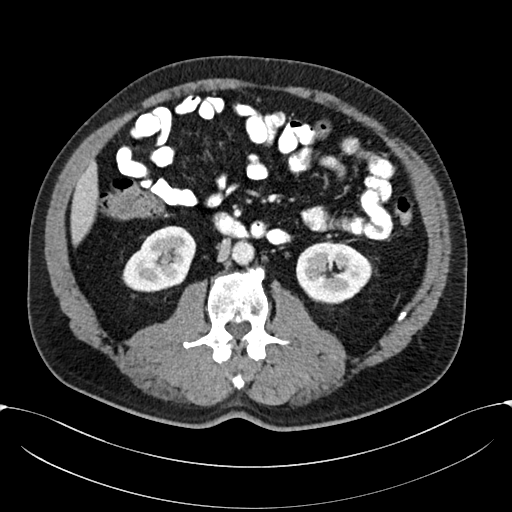
[im 64/101  bone]
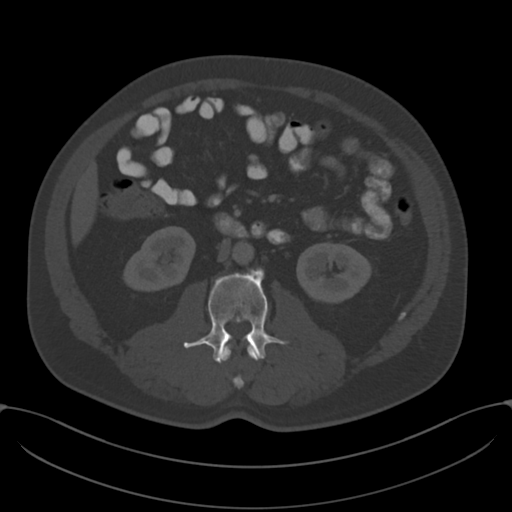
[im 74/101  soft-tissue]
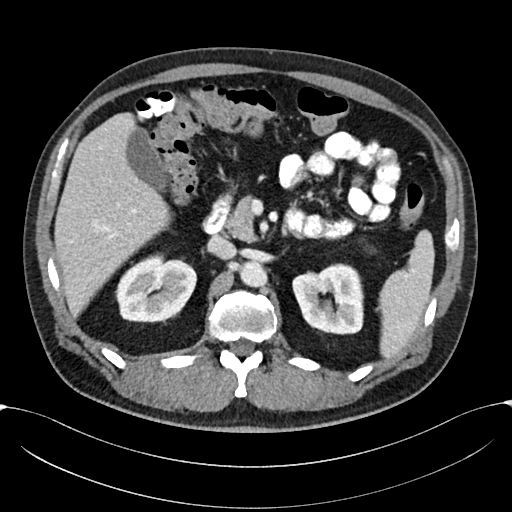
[im 79/101  soft-tissue]
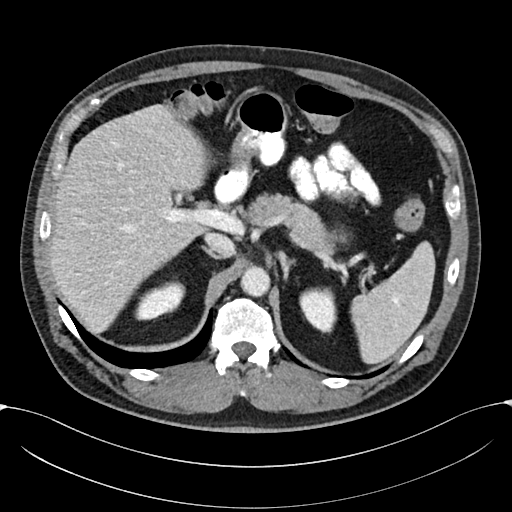
[im 85/101  soft-tissue]
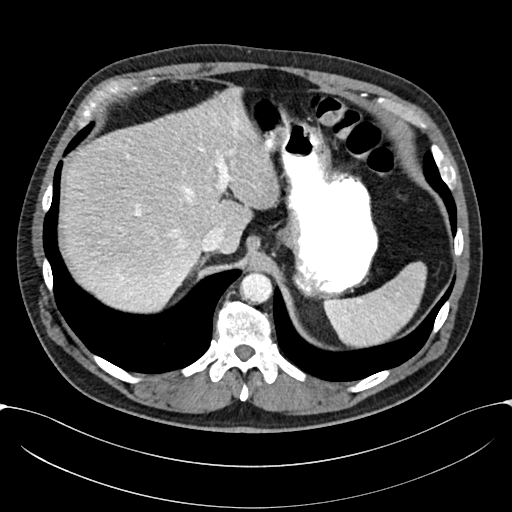
[im 95/101  soft-tissue]
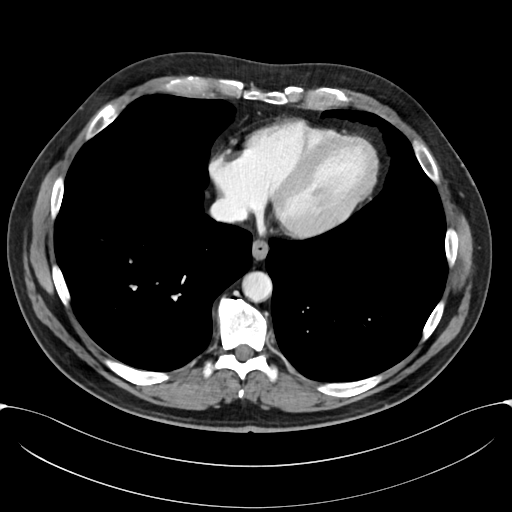

[Series 4: abd pelvis 2.00 br40 s3 cor · coronal · 0.81mm/px · 3 of 161 slices shown]
[im 54/161  soft-tissue]
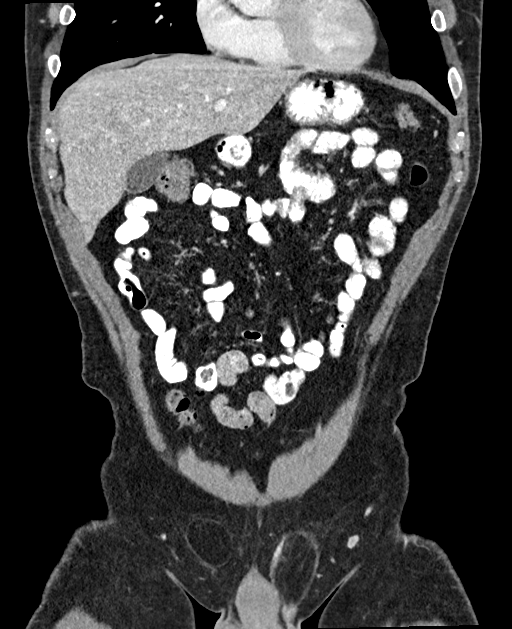
[im 72/161  soft-tissue]
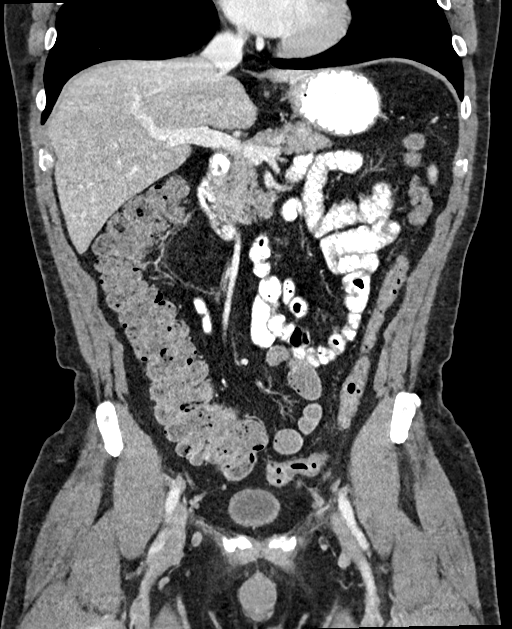
[im 89/161  soft-tissue]
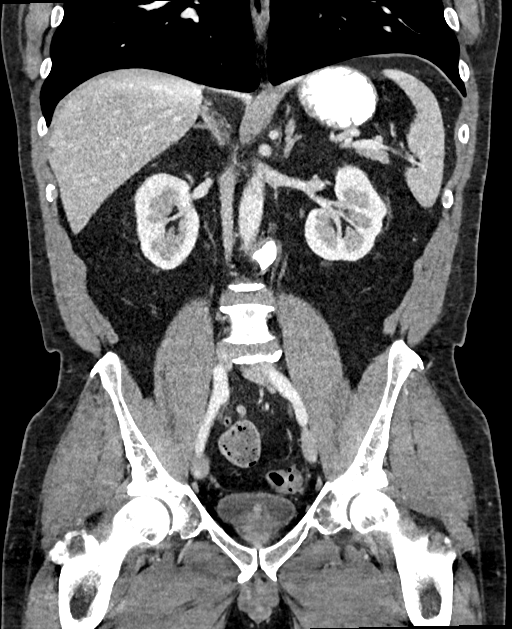

[16 of 46 positions shown; findings below may reference images not displayed]

FINDINGS: Lower chest: Lung bases clear

Hepatobiliary: Probable tiny cyst at dome of liver. Liver and
gallbladder otherwise normal appearance

Pancreas: Normal appearance

Spleen: Normal appearance

Adrenals/Urinary Tract: Adrenal glands, kidneys, and ureters normal
appearance. Mild enlargement of prostate gland indenting bladder
base with prominence of the central lobe noted. Bladder otherwise
unremarkable.

Stomach/Bowel: Low lying cecum in pelvis. Normal appearing
retrocecal appendix. Minimal diverticulosis of sigmoid colon without
evidence of diverticulitis. Colonic wall thickening and pericolic
infiltrative changes seen at the distal descending colon on the
previous exam improved. Stomach and remaining bowel loops normal
appearance.

Vascular/Lymphatic: Vascular structures unremarkable. No adenopathy.

Reproductive: Seminal vesicles unremarkable

Other: No free air or free fluid. BILATERAL inguinal hernias
containing fat. Mild stranding of the fat within the LEFT inguinal
hernia sac is identified which could reflect incarceration or
inflammation, question source of patient's LEFT lower quadrant
symptoms?

Musculoskeletal: No acute abnormalities
IMPRESSION: Resolution of diverticulitis changes seen on previous exam.

BILATERAL inguinal hernias containing fat, with stranding identified
within the fat of the LEFT inguinal hernia sac which could reflect
inflammation or incarceration, recommend correlation with physical
exam; could this potentially represents source of patient's
symptoms?

Mild prostatic enlargement especially central lobe indenting bladder
base.

## 2019-09-28 LAB — HM DIABETES EYE EXAM

## 2019-10-11 ENCOUNTER — Encounter: Payer: Self-pay | Admitting: Family Medicine

## 2019-10-19 ENCOUNTER — Other Ambulatory Visit: Payer: Self-pay | Admitting: Family Medicine

## 2019-10-19 DIAGNOSIS — F329 Major depressive disorder, single episode, unspecified: Secondary | ICD-10-CM

## 2019-10-19 DIAGNOSIS — F32A Depression, unspecified: Secondary | ICD-10-CM

## 2019-10-19 NOTE — Telephone Encounter (Signed)
Requested medication (s) are due for refill today: yes  Requested medication (s) are on the active medication list: yes  Last refill:  09/01/18  Future visit scheduled: yes  Notes to clinic:  Rx expired    Requested Prescriptions  Pending Prescriptions Disp Refills   DULoxetine (CYMBALTA) 30 MG capsule [Pharmacy Med Name: DULOXETINE HCL 30 MG CAP] 30 capsule 11    Sig: TAKE 1 CAPSULE BY MOUTH DAILY      Psychiatry: Antidepressants - SNRI Passed - 10/19/2019  9:41 AM      Passed - Completed PHQ-2 or PHQ-9 in the last 360 days.      Passed - Last BP in normal range    BP Readings from Last 1 Encounters:  07/20/19 128/71          Passed - Valid encounter within last 6 months    Recent Outpatient Visits           3 months ago Type 2 diabetes mellitus without complication, without long-term current use of insulin [facility])   [facility] Birdie Sons, MD   7 months ago Essential hypertension   [facility] Birdie Sons, MD   10 months ago Skin lesion   [facility] Birdie Sons, MD   1 year ago Type 2 diabetes mellitus without complication, without long-term current use of insulin [facility])   [facility] Birdie Sons, MD   1 year ago Type 2 diabetes mellitus without complication, without long-term current use of insulin [facility])   [facility] Birdie Sons, MD       Future Appointments             In 3 weeks Fisher, Kirstie Peri, MD [facility], [facility]

## 2019-10-27 ENCOUNTER — Other Ambulatory Visit: Payer: Self-pay | Admitting: Gastroenterology

## 2019-10-28 ENCOUNTER — Other Ambulatory Visit: Payer: Self-pay | Admitting: Family Medicine

## 2019-10-28 DIAGNOSIS — G4700 Insomnia, unspecified: Secondary | ICD-10-CM

## 2019-10-28 NOTE — Telephone Encounter (Signed)
Requested medication (s) are due for refill today: yes  Requested medication (s) are on the active medication list: yes  Last refill:  03/23/19  Future visit scheduled: yes  Notes to clinic:  medication not delegated to NT to refill   Requested Prescriptions  Pending Prescriptions Disp Refills   zolpidem (AMBIEN) 10 MG tablet [Pharmacy Med Name: ZOLPIDEM TARTRATE 10 MG TAB] 60 tablet     Sig: TAKE 1 TO 2 TABLETS (10 MG TO 20 MG TOTAL) BY MOUTH AT BEDTIME AS NEEDED      Not Delegated - Psychiatry:  Anxiolytics/Hypnotics Failed - 10/28/2019  8:58 AM      Failed - This refill cannot be delegated      Failed - Urine Drug Screen completed in last 360 days.      Passed - Valid encounter within last 6 months    Recent Outpatient Visits           3 months ago Type 2 diabetes mellitus without complication, without long-term current use of insulin ([place])   [facility] Birdie Sons, MD   7 months ago Essential hypertension   [facility] Birdie Sons, MD   11 months ago Skin lesion   [facility] Birdie Sons, MD   1 year ago Type 2 diabetes mellitus without complication, without long-term current use of insulin [facility])   [facility] Birdie Sons, MD   1 year ago Type 2 diabetes mellitus without complication, without long-term current use of insulin [facility])   [facility] Birdie Sons, MD       Future Appointments             In 2 weeks Fisher, Kirstie Peri, MD [facility], [facility]

## 2019-11-14 NOTE — Progress Notes (Signed)
Patient: Keith Carpenter Male    DOB: 08-13-1956   64 y.o.   MRN: [id number] Visit Date: 11/15/2019  Today's Provider: Lelon Huh, MD   Chief Complaint  Patient presents with  . Hypertension  . Diabetes  . Depression  . Hypothyroidism   Subjective:     HPI  Hypertension, follow-up:     BP Readings from Last 3 Encounters:  07/17/19 (!) 146/76  04/20/19 (!) 143/82  03/31/19 (!) 144/81    He was last seen for hypertension 4 months ago.  BP at that visit was 146/76. Management since that visit includes no changes. He reports good compliance with treatment. He is not having side effects.  He is exercising. He is adherent to low salt diet.   Outside blood pressures are being checked once a week. He is experiencing none.  Patient denies chest pain, chest pressure/discomfort, claudication, dyspnea, exertional chest pressure/discomfort, fatigue, irregular heart beat, lower extremity edema, near-syncope, orthopnea, palpitations, paroxysmal nocturnal dyspnea, syncope and tachypnea.   Cardiovascular risk factors include advanced age (older than 52 for men, 5 for women), diabetes mellitus, dyslipidemia, hypertension and male gender.  Use of agents associated with hypertension: thyroid hormones.                Weight trend: stable    Wt Readings from Last 3 Encounters:  07/17/19 219 lb 3.2 oz (99.4 kg)  04/20/19 216 lb (98 kg)  03/31/19 210 lb (95.3 kg)   Current diet: balanced  ------------------------------------------------------------------------  Diabetes Mellitus Type II, Follow-up:   Recent Labs       Lab Results  Component Value Date   HGBA1C 6.6 (A) 03/03/2019   HGBA1C 6.8 (A) 08/19/2018   HGBA1C 6.7 (A) 04/07/2018      Last seen for diabetes 4 months ago.  Management since then includes patient advised to try to be a little more strict with diet and stay active. He reports good compliance with treatment. He is not having side  effects.  Current symptoms include none  Home blood sugar records: being checked occasionally. Patient states he has not checked FBS in a couple weeks  Episodes of hypoglycemia? No              Current insulin regiment: Is not on insulin Most Recent Eye Exam: Patient reports last eye exam was January 2021 Weight trend: stable Prior visit with dietician: No Current exercise: walking Current diet habits: well balanced  Pertinent Labs: Labs (Brief)          Component Value Date/Time   CHOL 116 03/03/2019 0944   CHOL 220 (H) 02/23/2012 0416   TRIG 65 03/03/2019 0944   TRIG 104 02/23/2012 0416   HDL 42 03/03/2019 0944   HDL 36 (L) 02/23/2012 0416   LDLCALC 61 03/03/2019 0944   LDLCALC 163 (H) 02/23/2012 0416   CREATININE 0.97 03/03/2019 0944   CREATININE 0.93 07/20/2017 1135         Wt Readings from Last 3 Encounters:  07/17/19 219 lb 3.2 oz (99.4 kg)  04/20/19 216 lb (98 kg)  03/31/19 210 lb (95.3 kg)    ------------------------------------------------------------------------  Follow up for Depression:  The patient was last seen for this 4 months ago. Changes made at last visit include none.  He reports good compliance with treatment. He feels that condition is stable. He is not having side effects.   ------------------------------------------------------------------------------------  Follow up for Hypothyroidism:  The patient was last seen for  this 4 months ago. Changes made at last visit include none.  He reports good compliance with treatment. He feels that condition is stable. He is not having side effects.  Has been walking 3-5 days a week and cutting back on calorie intake But still gaining weight ------------------------------------------------------------------------------------  Has history of low testosterone on patches for years in the past, but stopped due to rash which he now states was due to [employer]. He has entered a new  relationship and reports poor libido and having difficulty with erections. He is not interested in ED medications right now. He would rather get back on testosterone patches if levels are still low.   Allergies  Allergen Reactions  . Influenza Vaccines Other (See Comments)    Guillain Barre  . Ixekizumab Hives    Rashes, swollen eyes, heat in facial region  . Declomycin [Demeclocycline]   . Demeclocycline Hcl   . Ginger   . Glipizide     Nausea   . Metformin And Related Nausea Only    Metformin IR 500 BID  . Other     Other reaction(s): Other (See Comments) Uncoded Allergy. Allergen: Other Allergy: See Patient Chart for Details, Other Reaction: Other reaction  . Prednisone     Personal history of aortic aneurysm  . Remicade [Infliximab]   . Suvorexant Other (See Comments)    vision changes  . Clarithromycin Rash  . Pioglitazone Itching, Swelling and Rash     Current Outpatient Medications:  .  amLODipine (NORVASC) 5 MG tablet, TAKE 1 TABLET BY MOUTH DAILY, Disp: 30 tablet, Rfl: 12 .  atorvastatin (LIPITOR) 10 MG tablet, TAKE 1 TABLET BY MOUTH ONCE A DAY EVERY EVENING, Disp: 90 tablet, Rfl: 4 .  dicyclomine (BENTYL) 10 MG capsule, Take 1 capsule (10 mg total) by mouth 4 (four) times daily -  before meals and at bedtime., Disp: 120 capsule, Rfl: 5 .  DULoxetine (CYMBALTA) 30 MG capsule, TAKE 1 CAPSULE BY MOUTH DAILY, Disp: 30 capsule, Rfl: 11 .  EUCRISA 2 % OINT, , Disp: , Rfl:  .  HYDROcodone-acetaminophen (NORCO/VICODIN) 5-325 MG tablet, Take 1 tablet by mouth 2 (two) times daily as needed for moderate pain., Disp: 60 tablet, Rfl: 0 .  levothyroxine (SYNTHROID, LEVOTHROID) 125 MCG tablet, , Disp: , Rfl: 3 .  metoprolol tartrate (LOPRESSOR) 50 MG tablet, TAKE 1 TABLET BY MOUTH TWICE A DAY, Disp: 60 tablet, Rfl: 11 .  omeprazole (PRILOSEC) 20 MG capsule, TAKE 1 CAPSULE (20 MG TOTAL) BY MOUTH DAILY, Disp: 30 capsule, Rfl: 12 .  simethicone (MYLICON) 0000000 MG chewable tablet, Chew  125 mg by mouth every 6 (six) hours as needed for flatulence., Disp: , Rfl:  .  triamcinolone cream (KENALOG) 0.1 %, , Disp: , Rfl: 2 .  zolpidem (AMBIEN) 10 MG tablet, TAKE 1 TO 2 TABLETS (10 MG TO 20 MG TOTAL) BY MOUTH AT BEDTIME AS NEEDED, Disp: 60 tablet, Rfl: 5 .  TALTZ 51 MG/ML SOAJ, , Disp: , Rfl:   Review of Systems  Constitutional: Negative.   Respiratory: Negative.   Cardiovascular: Negative.   Endocrine: Negative.   Musculoskeletal: Negative.     Social History   Tobacco Use  . Smoking status: Never Smoker  . Smokeless tobacco: Never Used  Substance Use Topics  . Alcohol use: No      Objective:   BP (!) 145/82 (BP Location: Right Arm, Patient Position: Sitting, Cuff Size: Normal)   Pulse (!) 57   Temp (!) 96.9 F (  36.1 C) (Temporal)   Wt 223 lb 3.2 oz (101.2 kg)   BMI 31.13 kg/m  Vitals:   11/15/19 0822  BP: (!) 145/82  Pulse: (!) 57  Temp: (!) 96.9 F (36.1 C)  TempSrc: Temporal  Weight: 223 lb 3.2 oz (101.2 kg)  Body mass index is 31.13 kg/m.   Physical Exam   General: Appearance:    Obese male in no acute distress  Eyes:    PERRL, conjunctiva/corneas clear, EOM's intact       Lungs:     Clear to auscultation bilaterally, respirations unlabored  Heart:    Bradycardic. Normal rhythm. No murmurs, rubs, or gallops.   MS:   All extremities are intact.   Neurologic:   Awake, alert, oriented x 3. No apparent focal neurological           defect.        Results for orders placed or performed in visit on 11/15/19  POCT HgB A1C  Result Value Ref Range   Hemoglobin A1C 6.7 (A) 4.0 - 5.6 %   Est. average glucose Bld gHb Est-mCnc 146        Assessment & Plan    1. Type 2 diabetes mellitus without complication, without long-term current use of insulin ([place]) Diet controlled  2. Psoriatic arthritis ([place]) Doing well, now off of Taltz and   3. Thoracic aortic aneurysm without rupture ([place]) Last CT was 05-2018. Patient has follow up [facility] rheumatology in  May, but not sure if upcoming follow up regarding aneurysm follow up. May need to schedule locally.   4. Loss of libido Previously dis well on testosterone patches.  - Testosterone,Free and Total  5. Essential hypertension Not to goal, double dose of amlodipine - Comprehensive metabolic panel - CBC - amLODipine (NORVASC) 10 MG tablet; TAKE 1 TABLET BY MOUTH DAILY  Dispense: 30 tablet; Refill: 2  6. Hypothyroidism, unspecified type  - TSH - T4, free  7.  Hyperlipidemia, unspecified hyperlipidemia type He is tolerating atorvastatin well with no adverse effects.    8. Erectile dysfunction, unspecified erectile dysfunction type  - Testosterone,Free and Total  9. Calcium deficiency disease  - Parathyroid hormone, intact (no Ca)    Lelon Huh, MD  [facility]

## 2019-11-15 ENCOUNTER — Ambulatory Visit (INDEPENDENT_AMBULATORY_CARE_PROVIDER_SITE_OTHER): Payer: BC Managed Care – PPO | Admitting: Family Medicine

## 2019-11-15 ENCOUNTER — Other Ambulatory Visit: Payer: Self-pay

## 2019-11-15 ENCOUNTER — Encounter: Payer: Self-pay | Admitting: Family Medicine

## 2019-11-15 VITALS — BP 145/82 | HR 57 | Temp 96.9°F | Wt 223.2 lb

## 2019-11-15 DIAGNOSIS — E119 Type 2 diabetes mellitus without complications: Secondary | ICD-10-CM | POA: Diagnosis not present

## 2019-11-15 DIAGNOSIS — I10 Essential (primary) hypertension: Secondary | ICD-10-CM

## 2019-11-15 DIAGNOSIS — E039 Hypothyroidism, unspecified: Secondary | ICD-10-CM

## 2019-11-15 DIAGNOSIS — R6882 Decreased libido: Secondary | ICD-10-CM | POA: Diagnosis not present

## 2019-11-15 DIAGNOSIS — N529 Male erectile dysfunction, unspecified: Secondary | ICD-10-CM

## 2019-11-15 DIAGNOSIS — I712 Thoracic aortic aneurysm, without rupture, unspecified: Secondary | ICD-10-CM

## 2019-11-15 DIAGNOSIS — E785 Hyperlipidemia, unspecified: Secondary | ICD-10-CM

## 2019-11-15 DIAGNOSIS — L4050 Arthropathic psoriasis, unspecified: Secondary | ICD-10-CM | POA: Diagnosis not present

## 2019-11-15 LAB — POCT GLYCOSYLATED HEMOGLOBIN (HGB A1C)
Est. average glucose Bld gHb Est-mCnc: 146
Hemoglobin A1C: 6.7 % — AB (ref 4.0–5.6)

## 2019-11-15 MED ORDER — AMLODIPINE BESYLATE 10 MG PO TABS: ORAL_TABLET | ORAL | 2 refills | Status: DC

## 2019-11-15 NOTE — Patient Instructions (Signed)
.   Please review the attached list of medications and notify my office if there are any errors.   . Please bring all of your medications to every appointment so we can make sure that our medication list is the same as yours.    You can take two 5mg  tablets of amlodipine until your current bottle is out. If tolerating this dose you can fill the prescription for 10mg  tablets.

## 2019-11-18 LAB — CBC
Hematocrit: 42.3 % (ref 37.5–51.0)
Hemoglobin: 14.9 g/dL (ref 13.0–17.7)
MCH: 30.2 pg (ref 26.6–33.0)
MCHC: 35.2 g/dL (ref 31.5–35.7)
MCV: 86 fL (ref 79–97)
Platelets: 256 10*3/uL (ref 150–450)
RBC: 4.94 x10E6/uL (ref 4.14–5.80)
RDW: 12.6 % (ref 11.6–15.4)
WBC: 6.9 10*3/uL (ref 3.4–10.8)

## 2019-11-18 LAB — COMPREHENSIVE METABOLIC PANEL
ALT: 36 IU/L (ref 0–44)
AST: 31 IU/L (ref 0–40)
Albumin/Globulin Ratio: 1.7 (ref 1.2–2.2)
Albumin: 4.4 g/dL (ref 3.8–4.8)
Alkaline Phosphatase: 86 IU/L (ref 39–117)
BUN/Creatinine Ratio: 11 (ref 10–24)
BUN: 11 mg/dL (ref 8–27)
Bilirubin Total: 0.4 mg/dL (ref 0.0–1.2)
CO2: 22 mmol/L (ref 20–29)
Calcium: 8.5 mg/dL — ABNORMAL LOW (ref 8.6–10.2)
Chloride: 102 mmol/L (ref 96–106)
Creatinine, Ser: 1.03 mg/dL (ref 0.76–1.27)
GFR calc Af Amer: 90 mL/min/{1.73_m2} (ref >59)
GFR calc non Af Amer: 78 mL/min/{1.73_m2} (ref >59)
Globulin, Total: 2.6 g/dL (ref 1.5–4.5)
Glucose: 116 mg/dL — ABNORMAL HIGH (ref 65–99)
Potassium: 4.4 mmol/L (ref 3.5–5.2)
Sodium: 140 mmol/L (ref 134–144)
Total Protein: 7 g/dL (ref 6.0–8.5)

## 2019-11-18 LAB — PARATHYROID HORMONE, INTACT (NO CA): PTH: 18 pg/mL (ref 15–65)

## 2019-11-18 LAB — TESTOSTERONE,FREE AND TOTAL
Testosterone, Free: 8.5 pg/mL (ref 6.6–18.1)
Testosterone: 427 ng/dL (ref 264–916)

## 2019-11-18 LAB — TSH: TSH: 1.62 u[IU]/mL (ref 0.450–4.500)

## 2019-11-18 LAB — T4, FREE: Free T4: 1.26 ng/dL (ref 0.82–1.77)

## 2019-11-21 ENCOUNTER — Encounter: Payer: Self-pay | Admitting: Family Medicine

## 2019-11-22 LAB — VITAMIN D 25 HYDROXY (VIT D DEFICIENCY, FRACTURES): Vit D, 25-Hydroxy: 19.7 ng/mL — ABNORMAL LOW (ref 30.0–100.0)

## 2019-11-22 LAB — SPECIMEN STATUS REPORT

## 2020-01-18 ENCOUNTER — Other Ambulatory Visit: Payer: Self-pay | Admitting: Family Medicine

## 2020-01-18 DIAGNOSIS — I10 Essential (primary) hypertension: Secondary | ICD-10-CM

## 2020-02-15 ENCOUNTER — Other Ambulatory Visit: Payer: Self-pay | Admitting: Family Medicine

## 2020-02-15 DIAGNOSIS — I10 Essential (primary) hypertension: Secondary | ICD-10-CM

## 2020-05-02 ENCOUNTER — Other Ambulatory Visit: Payer: Self-pay | Admitting: Family Medicine

## 2020-05-02 DIAGNOSIS — G4700 Insomnia, unspecified: Secondary | ICD-10-CM

## 2020-05-02 NOTE — Telephone Encounter (Signed)
Requested medication (s) are due for refill today: yes  Requested medication (s) are on the active medication list: yes  Last refill:  10/30/19 #60 with 5 refills  Future visit scheduled: no  Notes to clinic:  Please review for refill. Refill not delegated per protocol    Requested Prescriptions  Pending Prescriptions Disp Refills   zolpidem (AMBIEN) 10 MG tablet [Pharmacy Med Name: ZOLPIDEM TARTRATE 10 MG TAB] 60 tablet     Sig: TAKE 1 TO 2 TABLETS (10 MG TO 20 MG TOTAL) BY MOUTH AT BEDTIME AS NEEDED      Not Delegated - Psychiatry:  Anxiolytics/Hypnotics Failed - 05/02/2020  5:46 PM      Failed - This refill cannot be delegated      Failed - Urine Drug Screen completed in last 360 days.      Passed - Valid encounter within last 6 months    Recent Outpatient Visits           5 months ago Type 2 diabetes mellitus without complication, without long-term current use of insulin ([place])   [facility] Birdie Sons, MD   9 months ago Type 2 diabetes mellitus without complication, without long-term current use of insulin [facility])   [facility] Birdie Sons, MD   1 year ago Essential hypertension   [facility] Birdie Sons, MD   1 year ago Skin lesion   [facility] Birdie Sons, MD   1 year ago Type 2 diabetes mellitus without complication, without long-term current use of insulin [facility])   [facility] Birdie Sons, MD

## 2020-05-03 NOTE — Telephone Encounter (Signed)
Please review for Dr. Fisher.  

## 2020-05-16 ENCOUNTER — Other Ambulatory Visit: Payer: Self-pay | Admitting: Family Medicine

## 2020-05-16 DIAGNOSIS — E785 Hyperlipidemia, unspecified: Secondary | ICD-10-CM

## 2020-05-16 DIAGNOSIS — I10 Essential (primary) hypertension: Secondary | ICD-10-CM

## 2020-05-16 NOTE — Telephone Encounter (Signed)
Requested medication (s) are due for refill today: Yes  Requested medication (s) are on the active medication list: Yes  Last refill: 03/30/19  Future visit scheduled: No  Notes to clinic:  Unable to refill, failed labs, expired Rx     Requested Prescriptions  Pending Prescriptions Disp Refills   atorvastatin (LIPITOR) 10 MG tablet [Pharmacy Med Name: ATORVASTATIN CALCIUM 10 MG TAB] 90 tablet 4    Sig: TAKE 1 TABLET BY MOUTH ONCE A DAY EVERY EVENING      Cardiovascular:  Antilipid - Statins Failed - 05/16/2020  4:52 PM      Failed - Total Cholesterol in normal range and within 360 days    Cholesterol, Total  Date Value Ref Range Status  03/03/2019 116 100 - 199 mg/dL Final   Cholesterol  Date Value Ref Range Status  02/23/2012 220 (H) 0 - 200 mg/dL Final          Failed - LDL in normal range and within 360 days    Ldl Cholesterol, Calc  Date Value Ref Range Status  02/23/2012 163 (H) 0 - 100 mg/dL Final   LDL Calculated  Date Value Ref Range Status  03/03/2019 61 0 - 99 mg/dL Final          Failed - HDL in normal range and within 360 days    HDL Cholesterol  Date Value Ref Range Status  02/23/2012 36 (L) 40 - 60 mg/dL Final   HDL  Date Value Ref Range Status  03/03/2019 42 >39 mg/dL Final          Failed - Triglycerides in normal range and within 360 days    Triglycerides  Date Value Ref Range Status  03/03/2019 65 0 - 149 mg/dL Final  02/23/2012 104 0 - 200 mg/dL Final          Passed - Patient is not pregnant      Passed - Valid encounter within last 12 months    Recent Outpatient Visits           6 months ago Type 2 diabetes mellitus without complication, without long-term current use of insulin ([place])   [facility] Birdie Sons, MD   10 months ago Type 2 diabetes mellitus without complication, without long-term current use of insulin ([place])   [facility] Birdie Sons, MD   1 year ago Essential hypertension    [facility] Birdie Sons, MD   1 year ago Skin lesion   [facility] Birdie Sons, MD   1 year ago Type 2 diabetes mellitus without complication, without long-term current use of insulin [facility])   [facility] Birdie Sons, MD               Signed Prescriptions Disp Refills   amLODipine (NORVASC) 10 MG tablet 90 tablet 0    Sig: TAKE 1 TABLET BY MOUTH DAILY      Cardiovascular:  Calcium Channel Blockers Failed - 05/16/2020  4:52 PM      Failed - Last BP in normal range    BP Readings from Last 1 Encounters:  11/15/19 (!) 145/82          Failed - Valid encounter within last 6 months    Recent Outpatient Visits           6 months ago Type 2 diabetes mellitus without complication, without long-term current use of insulin ([place])   [facility]  Kirstie Peri, MD   10 months ago Type 2 diabetes mellitus without complication, without long-term current use of insulin [facility])   [facility] Birdie Sons, MD   1 year ago Essential hypertension   [facility] Birdie Sons, MD   1 year ago Skin lesion   [facility] Birdie Sons, MD   1 year ago Type 2 diabetes mellitus without complication, without long-term current use of insulin [facility])   [facility] Birdie Sons, MD

## 2020-06-14 ENCOUNTER — Ambulatory Visit: Payer: Self-pay

## 2020-06-14 NOTE — Telephone Encounter (Signed)
Patient called stating that he feels bad and just wants to see the doctor.  He states that yesterday he develop a headache stiffneck and back.  He states that he is having problems with his rt eye blurring pf vision and it twitches.  He states that he has problems with balance and strength in his legs. He states he has noticed that his lower legs and feet swell during the day. The skin is red. He is unsure if he has fever but states his eyes burn. His BP last evening 172/102. 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 He is fully vaccinated. He states that he is able to touch his chin to his chest The stiffness in his neck and down his back has continued to worsen today. He rates the pain at 8-9. Per protocol patient will go to ER for evaluation. Care advice given. He verbalized understanding.  Reason for Disposition . Patient sounds very sick or weak to the triager  Answer Assessment - Initial Assessment Questions 1. ONSET: "When did the pain begin?"      yesterday 2. LOCATION: "Where does it hurt?"      Stiff neck and back 3. PATTERN "Does the pain come and go, or has it been constant since it started?"      Getting worse 4. SEVERITY: "How bad is the pain?"  (Scale 1-10; or mild, moderate, severe)   - NO PAIN (0): no pain or only slight stiffness    - MILD (1-3): doesn't interfere with normal activities    - MODERATE (4-7): interferes with normal activities or awakens from sleep    - SEVERE (8-10):  excruciating pain, unable to do any normal activities     8-9 5. RADIATION: "Does the pain go anywhere else, shoot into your arms?"     no 6. CORD SYMPTOMS: "Any weakness or numbness of the arms or legs?"   Leg weakness and balance blurring vision rt eye and twitching 7. CAUSE: "What do you think is causing the neck pain?"    unsure 8. NECK OVERUSE: "Any recent activities that involved turning or twisting the neck?"     no 9. OTHER SYMPTOMS: "Do you have any other symptoms?"  (e.g., headache, fever, chest pain, difficulty breathing, neck swelling)     Headache legs swelling and feet red incolor 10. PREGNANCY: "Is there any chance you are pregnant?" "When was your last menstrual period?"      N/A  Protocols used: NECK PAIN OR STIFFNESS-A-AH

## 2020-06-21 ENCOUNTER — Other Ambulatory Visit: Payer: Self-pay | Admitting: Family Medicine

## 2020-06-21 NOTE — Telephone Encounter (Signed)
Requested medication (s) are due for refill today - yes  Requested medication (s) are on the active medication list -yes  Future visit scheduled -no  Last refill: 03/14/20  Notes to clinic: Request Rx from historical provider  Requested Prescriptions  Pending Prescriptions Disp Refills   levothyroxine (SYNTHROID) 125 MCG tablet [Pharmacy Med Name: LEVOTHYROXINE SODIUM 125 MCG TAB] 90 tablet     Sig: TAKE 1 TABLET BY MOUTH DAILY      Endocrinology:  Hypothyroid Agents Failed - 06/21/2020  4:17 PM      Failed - TSH needs to be rechecked within 3 months after an abnormal result. Refill until TSH is due.      Passed - TSH in normal range and within 360 days    TSH  Date Value Ref Range Status  11/15/2019 1.620 0.450 - 4.500 uIU/mL Final          Passed - Valid encounter within last 12 months    Recent Outpatient Visits           7 months ago Type 2 diabetes mellitus without complication, without long-term current use of insulin [facility])   [facility] Birdie Sons, MD   11 months ago Type 2 diabetes mellitus without complication, without long-term current use of insulin [facility])   [facility] Birdie Sons, MD   1 year ago Essential hypertension   [facility] Birdie Sons, MD   1 year ago Skin lesion   [facility] Birdie Sons, MD   1 year ago Type 2 diabetes mellitus without complication, without long-term current use of insulin [facility])   [facility] Birdie Sons, MD                  Requested Prescriptions  Pending Prescriptions Disp Refills   levothyroxine (SYNTHROID) 125 MCG tablet [Pharmacy Med Name: LEVOTHYROXINE SODIUM 125 MCG TAB] 90 tablet     Sig: TAKE 1 TABLET BY MOUTH DAILY      Endocrinology:  Hypothyroid Agents Failed - 06/21/2020  4:17 PM      Failed - TSH needs to be rechecked within 3 months after an abnormal result. Refill until TSH is due.      Passed - TSH in  normal range and within 360 days    TSH  Date Value Ref Range Status  11/15/2019 1.620 0.450 - 4.500 uIU/mL Final          Passed - Valid encounter within last 12 months    Recent Outpatient Visits           7 months ago Type 2 diabetes mellitus without complication, without long-term current use of insulin [facility])   [facility] Birdie Sons, MD   11 months ago Type 2 diabetes mellitus without complication, without long-term current use of insulin [facility])   [facility] Birdie Sons, MD   1 year ago Essential hypertension   [facility] Birdie Sons, MD   1 year ago Skin lesion   [facility] Birdie Sons, MD   1 year ago Type 2 diabetes mellitus without complication, without long-term current use of insulin [facility])   [facility] Caryn Section, Kirstie Peri, MD

## 2020-08-09 ENCOUNTER — Other Ambulatory Visit: Payer: Self-pay | Admitting: Gastroenterology

## 2020-08-09 ENCOUNTER — Other Ambulatory Visit: Payer: Self-pay | Admitting: Family Medicine

## 2020-08-09 DIAGNOSIS — I10 Essential (primary) hypertension: Secondary | ICD-10-CM

## 2020-08-12 NOTE — Telephone Encounter (Signed)
Patient is overdue for office visit. Please call and advise patient of this.

## 2020-08-20 ENCOUNTER — Other Ambulatory Visit: Payer: Self-pay | Admitting: Family Medicine

## 2020-08-20 DIAGNOSIS — I10 Essential (primary) hypertension: Secondary | ICD-10-CM

## 2020-08-20 MED ORDER — AMLODIPINE BESYLATE 10 MG PO TABS: 10.0000 mg | ORAL_TABLET | Freq: Every day | ORAL | 0 refills | Status: DC

## 2020-08-21 NOTE — Telephone Encounter (Signed)
Tried calling patient. Left message to call back. OK for [facility] triage to advise and schedule appointment.  

## 2020-09-10 ENCOUNTER — Encounter: Payer: Self-pay | Admitting: Family Medicine

## 2020-09-10 ENCOUNTER — Other Ambulatory Visit: Payer: Self-pay

## 2020-09-10 ENCOUNTER — Ambulatory Visit: Payer: BC Managed Care – PPO | Admitting: Family Medicine

## 2020-09-10 VITALS — BP 121/69 | HR 60 | Temp 98.2°F | Resp 16 | Ht 71.0 in | Wt 222.0 lb

## 2020-09-10 DIAGNOSIS — I10 Essential (primary) hypertension: Secondary | ICD-10-CM | POA: Diagnosis not present

## 2020-09-10 DIAGNOSIS — I712 Thoracic aortic aneurysm, without rupture: Secondary | ICD-10-CM

## 2020-09-10 DIAGNOSIS — E785 Hyperlipidemia, unspecified: Secondary | ICD-10-CM | POA: Diagnosis not present

## 2020-09-10 DIAGNOSIS — E039 Hypothyroidism, unspecified: Secondary | ICD-10-CM | POA: Diagnosis not present

## 2020-09-10 DIAGNOSIS — E119 Type 2 diabetes mellitus without complications: Secondary | ICD-10-CM | POA: Diagnosis not present

## 2020-09-10 DIAGNOSIS — I7121 Aneurysm of the ascending aorta, without rupture: Secondary | ICD-10-CM

## 2020-09-10 DIAGNOSIS — R609 Edema, unspecified: Secondary | ICD-10-CM

## 2020-09-10 LAB — POCT GLYCOSYLATED HEMOGLOBIN (HGB A1C)
Est. average glucose Bld gHb Est-mCnc: 134
Hemoglobin A1C: 6.3 % — AB (ref 4.0–5.6)

## 2020-09-10 MED ORDER — AMLODIPINE BESYLATE 5 MG PO TABS: 10.0000 mg | ORAL_TABLET | Freq: Every day | ORAL | 1 refills | Status: DC

## 2020-09-10 MED ORDER — AMLODIPINE BESYLATE 5 MG PO TABS: 5.0000 mg | ORAL_TABLET | Freq: Every day | ORAL | 1 refills | Status: DC

## 2020-09-10 MED ORDER — ATORVASTATIN CALCIUM 10 MG PO TABS
ORAL_TABLET | ORAL | 3 refills | Status: DC
Start: 2020-09-10 — End: 2020-12-30

## 2020-09-10 MED ORDER — LEVOTHYROXINE SODIUM 125 MCG PO TABS: 125.0000 ug | ORAL_TABLET | Freq: Every day | ORAL | 4 refills | Status: DC

## 2020-09-10 MED ORDER — ATORVASTATIN CALCIUM 10 MG PO TABS: ORAL_TABLET | ORAL | 3 refills | Status: DC

## 2020-09-10 NOTE — Progress Notes (Signed)
I,April Miller,acting as a scribe for Lelon Huh, MD.,have documented all relevant documentation on the behalf of Lelon Huh, MD,as directed by  Lelon Huh, MD while in the presence of Lelon Huh, MD.   Established patient visit   Patient: Keith Carpenter   DOB: 1956-02-18   64 y.o. Male  MRN: [id number] Visit Date: 09/10/2020  Today's healthcare provider: Lelon Huh, MD   Chief Complaint  Patient presents with  . Follow-up  . Hypertension  . Diabetes   Subjective    HPI  Diabetes Mellitus Type II, follow-up  Lab Results  Component Value Date   HGBA1C 6.7 (A) 11/15/2019   HGBA1C 6.9 (H) 07/17/2019   HGBA1C 6.6 (A) 03/03/2019   Last seen for diabetes 9 months ago.  Management since then includes continuing the same treatment. He reports good compliance with treatment. He is not having side effects. none  Home blood sugar records: fasting range: 135-140  Episodes of hypoglycemia? No none   Current insulin regiment: n/a Most Recent Eye Exam: 09/28/2019  -------------------------------------------------------------------- Hypertension, follow-up  BP Readings from Last 3 Encounters:  09/10/20 121/69  11/15/19 (!) 145/82  07/20/19 128/71   Wt Readings from Last 3 Encounters:  09/10/20 222 lb (100.7 kg)  11/15/19 223 lb 3.2 oz (101.2 kg)  07/20/19 218 lb 3.2 oz (99 kg)     He was last seen for hypertension 9 months ago.  BP at that visit was 145/82. Management since that visit includes; increasing the dose of Amlodipine to 10mg  daily. He reports good compliance with treatment. He is not having side effects. none He is not exercising. He is adherent to low salt diet.   Outside blood pressures are up and down.  He does not smoke.  Use of agents associated with hypertension: none.   --------------------------------------------------------------------  Hypothyroid, follow-up  Lab Results  Component Value Date   TSH 1.620 11/15/2019    TSH 0.202 (L) 03/03/2019   TSH 1.689 08/24/2017   FREET4 1.26 11/15/2019   FREET4 1.28 (H) 08/24/2017   T4TOTAL 8.9 03/22/2017   Wt Readings from Last 3 Encounters:  09/10/20 222 lb (100.7 kg)  11/15/19 223 lb 3.2 oz (101.2 kg)  07/20/19 218 lb 3.2 oz (99 kg)    He was last seen for hypothyroid 9 months ago.  Management since that visit includes continuing same medication. He reports good compliance with treatment. He is not having side effects. none  -------------------------------------------------------------------- His main complaint today is swelling in his legs and feet sometimes making feet numb. Has been much worse the last few months, especially when not able to walk regularly.   Has also had sensation of needing to clear throat frequently. Hasn't noticed any sinus drainage or drip. Does have reflux for which he takes omeprazole and pepcid occasionally, but not on regular basis.      Medications: Outpatient Medications Prior to Visit  Medication Sig  . amLODipine (NORVASC) 10 MG tablet Take 1 tablet (10 mg total) by mouth daily. Please schedule an office visit before anymore refills.  Marland Kitchen atorvastatin (LIPITOR) 10 MG tablet TAKE 1 TABLET BY MOUTH ONCE A DAY EVERY EVENING  . clobetasol ointment (TEMOVATE) 0.05 % Apply twice daily to active areas on your elbows until smooth then stop.  Marland Kitchen dicyclomine (BENTYL) 10 MG capsule TAKE 1 CAPSULE BY MOUTH 4 TIMES DAILY BEFORE MEALS AND AT BEDTIME  . DULoxetine (CYMBALTA) 30 MG capsule TAKE 1 CAPSULE BY MOUTH DAILY  . folic acid (  FOLVITE) 1 MG tablet Take by mouth.  . levothyroxine (SYNTHROID) 125 MCG tablet TAKE 1 TABLET BY MOUTH DAILY  . lisinopril (ZESTRIL) 5 MG tablet Take 5 mg by mouth daily.  . methotrexate 250 MG/10ML injection Inject into the skin.  . metoprolol tartrate (LOPRESSOR) 50 MG tablet TAKE 1 TABLET BY MOUTH TWICE A DAY  . omeprazole (PRILOSEC) 20 MG capsule TAKE 1 CAPSULE (20 MG TOTAL) BY MOUTH DAILY  .  tiZANidine (ZANAFLEX) 2 MG tablet Take by mouth.  . triamcinolone cream (KENALOG) 0.1 %   . TUBERCULIN SYR 1CC/27GX1/2" 27G X 1/2" 1 ML MISC Inject into the skin.  Marland Kitchen zolpidem (AMBIEN) 10 MG tablet TAKE 1 TO 2 TABLETS (10 MG TO 20 MG TOTAL) BY MOUTH AT BEDTIME AS NEEDED  . EUCRISA 2 % OINT   . HYDROcodone-acetaminophen (NORCO/VICODIN) 5-325 MG tablet Take 1 tablet by mouth 2 (two) times daily as needed for moderate pain.  . simethicone (MYLICON) 125 MG chewable tablet Chew 125 mg by mouth every 6 (six) hours as needed for flatulence.   No facility-administered medications prior to visit.    Review of Systems  Constitutional: Negative for appetite change, chills and fever.  Respiratory: Negative for chest tightness, shortness of breath and wheezing.   Cardiovascular: Negative for chest pain and palpitations.  Gastrointestinal: Negative for abdominal pain, nausea and vomiting.      Objective    BP 121/69 (BP Location: Right Arm, Patient Position: Sitting, Cuff Size: Large)   Pulse 60   Temp 98.2 F (36.8 C) (Oral)   Resp 16   Ht 5\' 11"  (1.803 m)   Wt 222 lb (100.7 kg)   SpO2 97%   BMI 30.96 kg/m    Physical Exam   General: Appearance:    Mildly obese male in no acute distress  Eyes:    PERRL, conjunctiva/corneas clear, EOM's intact       Lungs:     Clear to auscultation bilaterally, respirations unlabored  Heart:    Normal heart rate. Normal rhythm. No murmurs, rubs, or gallops.   MS:   All extremities are intact.   Neurologic:   Awake, alert, oriented x 3. No apparent focal neurological           defect.         Results for orders placed or performed in visit on 09/10/20  POCT glycosylated hemoglobin (Hb A1C)  Result Value Ref Range   Hemoglobin A1C 6.3 (A) 4.0 - 5.6 %   Est. average glucose Bld gHb Est-mCnc 134     Assessment & Plan     1. Type 2 diabetes mellitus without complication, without long-term current use of insulin (HCC) Well controlled with diet.  Encourage to get back into daily walking routine.   2. Hypothyroidism, unspecified type refill- levothyroxine (SYNTHROID) 125 MCG tablet; Take 1 tablet (125 mcg total) by mouth daily.  Dispense: 90 tablet; Refill: 4  Check labs at follow up in 3 months  3. Hyperlipidemia, unspecified hyperlipidemia type refill- atorvastatin (LIPITOR) 10 MG tablet; TAKE 1 TABLET BY MOUTH ONCE A DAY EVERY EVENING  Dispense: 90 tablet; Refill: 3 Check labs at follow up   4. Essential hypertension Well controlled, reduce - amLODipine (NORVASC) to 5 MG tablet; Take 1 tablets (5 mg total) by mouth daily.  Dispense: 90 tablet;1 Refill: 1  5. Edema, unspecified type Likely exacerbated by beta blockers and CCB. Will reduce amlodipine to 5mg  as above. Consider adding diuretic if not improved or  if BP elevated at follow up .   6. Thoracic AA He was advised that last imaging study was done 06/2019. He states he was told that [facility] surgery is supposed to call him when it is time for follow up study.      The entirety of the information documented in the History of Present Illness, Review of Systems and Physical Exam were personally obtained by me. Portions of this information were initially documented by the CMA and reviewed by me for thoroughness and accuracy.      Mila Merry, MD  [facility] [phone] (phone) [phone] (fax)  [facility]

## 2020-09-19 ENCOUNTER — Other Ambulatory Visit: Payer: Self-pay | Admitting: Family Medicine

## 2020-09-19 DIAGNOSIS — I10 Essential (primary) hypertension: Secondary | ICD-10-CM

## 2020-11-14 ENCOUNTER — Other Ambulatory Visit: Payer: Self-pay | Admitting: Family Medicine

## 2020-11-14 DIAGNOSIS — F32A Depression, unspecified: Secondary | ICD-10-CM

## 2020-11-14 DIAGNOSIS — G4700 Insomnia, unspecified: Secondary | ICD-10-CM

## 2020-11-14 NOTE — Telephone Encounter (Signed)
Future visit in 1 month  

## 2020-11-14 NOTE — Telephone Encounter (Signed)
Requested medication (s) are due for refill today: yes  Requested medication (s) are on the active medication list: yes  Last refill:  05/03/20 #60 5 refills  Future visit scheduled: yes  Notes to clinic:  not delegated per protocol     Requested Prescriptions  Pending Prescriptions Disp Refills   zolpidem (AMBIEN) 10 MG tablet [Pharmacy Med Name: ZOLPIDEM TARTRATE 10 MG TAB] 60 tablet     Sig: TAKE 1 TO 2 TABLETS (10 MG TO 20 MG TOTAL) BY MOUTH AT BEDTIME AS NEEDED      Not Delegated - Psychiatry:  Anxiolytics/Hypnotics Failed - 11/14/2020  5:15 PM      Failed - This refill cannot be delegated      Failed - Urine Drug Screen completed in last 360 days      Passed - Valid encounter within last 6 months    Recent Outpatient Visits           2 months ago Type 2 diabetes mellitus without complication, without long-term current use of insulin ([place])   [facility] Birdie Sons, MD   1 year ago Type 2 diabetes mellitus without complication, without long-term current use of insulin ([place])   [facility] Birdie Sons, MD   1 year ago Type 2 diabetes mellitus without complication, without long-term current use of insulin ([place])   [facility] Birdie Sons, MD   1 year ago Essential hypertension   [place], Kirstie Peri, MD   1 year ago Skin lesion   [facility] Birdie Sons, MD       Future Appointments             In 1 month Fisher, Kirstie Peri, MD [facility], [facility]              Signed Prescriptions Disp Refills   DULoxetine (CYMBALTA) 30 MG capsule 30 capsule 4    Sig: TAKE 1 CAPSULE BY MOUTH DAILY      Psychiatry: Antidepressants - SNRI Passed - 11/14/2020  5:15 PM      Passed - Completed PHQ-2 or PHQ-9 in the last 360 days      Passed - Last BP in normal range    BP Readings from Last 1 Encounters:  09/10/20 121/69          Passed - Valid encounter within last  6 months    Recent Outpatient Visits           2 months ago Type 2 diabetes mellitus without complication, without long-term current use of insulin ([place])   [facility] Birdie Sons, MD   1 year ago Type 2 diabetes mellitus without complication, without long-term current use of insulin ([place])   [facility] Birdie Sons, MD   1 year ago Type 2 diabetes mellitus without complication, without long-term current use of insulin [facility])   [facility] Birdie Sons, MD   1 year ago Essential hypertension   [facility] Birdie Sons, MD   1 year ago Skin lesion   [facility] Birdie Sons, MD       Future Appointments             In 1 month Fisher, Kirstie Peri, MD [facility] [place]

## 2020-12-27 ENCOUNTER — Other Ambulatory Visit: Payer: Self-pay

## 2020-12-27 ENCOUNTER — Encounter: Payer: Self-pay | Admitting: Family Medicine

## 2020-12-27 ENCOUNTER — Ambulatory Visit: Payer: BC Managed Care – PPO | Admitting: Family Medicine

## 2020-12-27 VITALS — BP 118/66 | HR 59 | Temp 98.2°F | Resp 16 | Ht 71.0 in | Wt 224.0 lb

## 2020-12-27 DIAGNOSIS — E119 Type 2 diabetes mellitus without complications: Secondary | ICD-10-CM | POA: Diagnosis not present

## 2020-12-27 DIAGNOSIS — R238 Other skin changes: Secondary | ICD-10-CM

## 2020-12-27 DIAGNOSIS — E039 Hypothyroidism, unspecified: Secondary | ICD-10-CM

## 2020-12-27 DIAGNOSIS — R233 Spontaneous ecchymoses: Secondary | ICD-10-CM

## 2020-12-27 DIAGNOSIS — E559 Vitamin D deficiency, unspecified: Secondary | ICD-10-CM

## 2020-12-27 DIAGNOSIS — I10 Essential (primary) hypertension: Secondary | ICD-10-CM

## 2020-12-27 DIAGNOSIS — E785 Hyperlipidemia, unspecified: Secondary | ICD-10-CM

## 2020-12-27 DIAGNOSIS — Z125 Encounter for screening for malignant neoplasm of prostate: Secondary | ICD-10-CM

## 2020-12-27 DIAGNOSIS — R040 Epistaxis: Secondary | ICD-10-CM

## 2020-12-27 DIAGNOSIS — R609 Edema, unspecified: Secondary | ICD-10-CM

## 2020-12-27 LAB — POCT GLYCOSYLATED HEMOGLOBIN (HGB A1C)
Est. average glucose Bld gHb Est-mCnc: 134
Hemoglobin A1C: 6.6 % — AB (ref 4.0–5.6)

## 2020-12-27 MED ORDER — AMLODIPINE BESYLATE 5 MG PO TABS: 2.5000 mg | ORAL_TABLET | Freq: Every day | ORAL | Status: DC

## 2020-12-27 NOTE — Patient Instructions (Addendum)
Apply neosporin or antibiotic OINTMENT to each side of your nasal septum every evening  Reduce amlodipine to 1/2 tablet daily to see if this helps with swelling

## 2020-12-27 NOTE — Progress Notes (Signed)
Established patient visit   Patient: Keith Carpenter   DOB: 03/13/1956   65 y.o. Male  MRN: [id number] Visit Date: 12/27/2020  Today's healthcare provider: Lelon Huh, MD   Chief Complaint  Patient presents with  . Diabetes  . Hypothyroidism  . Edema   Subjective    HPI   Diabetes Mellitus Type II, Follow-up  Lab Results  Component Value Date   HGBA1C 6.6 (A) 12/27/2020   HGBA1C 6.3 (A) 09/10/2020   HGBA1C 6.7 (A) 11/15/2019   Wt Readings from Last 3 Encounters:  12/27/20 224 lb (101.6 kg)  09/10/20 222 lb (100.7 kg)  11/15/19 223 lb 3.2 oz (101.2 kg)   Last seen for diabetes 3 months ago.  Management since then includes no changes. Patient well controlled with diet. He is not having side effects.  Symptoms: No fatigue No foot ulcerations  No appetite changes No nausea  Yes paresthesia of the feet  No polydipsia  No polyuria No visual disturbances   No vomiting     Home blood sugar records: not checking often  Episodes of hypoglycemia? No    Current insulin regiment: none Most Recent Eye Exam: 09/28/2019 Current exercise: none Current diet habits: in general, a "healthy" diet    Pertinent Labs: Lab Results  Component Value Date   CHOL 116 03/03/2019   HDL 42 03/03/2019   LDLCALC 61 03/03/2019   TRIG 65 03/03/2019   CHOLHDL 2.8 03/03/2019   Lab Results  Component Value Date   NA 140 11/15/2019   K 4.4 11/15/2019   CREATININE 1.03 11/15/2019   GFRNONAA 78 11/15/2019   GFRAA 90 11/15/2019   GLUCOSE 116 (H) 11/15/2019     ---------------------------------------------------------------------------------------------------  Hypothyroid, follow-up  Lab Results  Component Value Date   TSH 1.620 11/15/2019   TSH 0.202 (L) 03/03/2019   TSH 1.689 08/24/2017   FREET4 1.26 11/15/2019   FREET4 1.28 (H) 08/24/2017   T4TOTAL 8.9 03/22/2017   Wt Readings from Last 3 Encounters:  12/27/20 224 lb (101.6 kg)  09/10/20 222 lb (100.7 kg)   11/15/19 223 lb 3.2 oz (101.2 kg)    He was last seen for hypothyroid 3 months ago.  Management since that visit includes no changes. He reports excellent compliance with treatment. He is not having side effects.  Symptoms: No change in energy level No constipation  No diarrhea No heat / cold intolerance  No nervousness No palpitations  No weight changes    ----------------------------------------------------------------------------------------- Follow up for Edema  The patient was last seen for this 3 months ago. Changes made at last visit include reduce amlodipine to 5mg . Consider adding diuretic if not improved or if BP elevated at follow up   He reports excellent compliance with treatment. He feels that condition is Unchanged. He is not having side effects. Home blood pressure remain very well controlled.   -----------------------------------------------------------------------------------------  He also reports frequent nose bleeds, easy bruising and prolonged bleeding with minor skin trauma the last several months.      Medications: Outpatient Medications Prior to Visit  Medication Sig  . amLODipine (NORVASC) 5 MG tablet Take 1 tablet (5 mg total) by mouth daily.  Marland Kitchen atorvastatin (LIPITOR) 10 MG tablet TAKE 1 TABLET BY MOUTH ONCE A DAY EVERY EVENING  . clobetasol ointment (TEMOVATE) 0.05 % Apply twice daily to active areas on your elbows until smooth then stop.  Marland Kitchen dicyclomine (BENTYL) 10 MG capsule TAKE 1 CAPSULE BY MOUTH 4 TIMES DAILY BEFORE  MEALS AND AT BEDTIME  . DULoxetine (CYMBALTA) 30 MG capsule TAKE 1 CAPSULE BY MOUTH DAILY  . folic acid (FOLVITE) 1 MG tablet Take by mouth.  . gabapentin (NEURONTIN) 300 MG capsule Take by mouth.  . levothyroxine (SYNTHROID) 125 MCG tablet Take 1 tablet (125 mcg total) by mouth daily.  Marland Kitchen lisinopril (ZESTRIL) 5 MG tablet Take 5 mg by mouth daily.  . methotrexate 250 MG/10ML injection Inject into the skin.  . metoprolol tartrate  (LOPRESSOR) 50 MG tablet TAKE 1 TABLET BY MOUTH TWICE A DAY  . omeprazole (PRILOSEC) 20 MG capsule TAKE 1 CAPSULE (20 MG TOTAL) BY MOUTH DAILY  . tiZANidine (ZANAFLEX) 2 MG tablet Take by mouth.  . triamcinolone cream (KENALOG) 0.1 %   . TUBERCULIN SYR 1CC/27GX1/2" 27G X 1/2" 1 ML MISC Inject into the skin.  Marland Kitchen zolpidem (AMBIEN) 10 MG tablet Take 1-2 tablets (10-20 mg total) by mouth at bedtime as needed for sleep.  . [DISCONTINUED] EUCRISA 2 % OINT   . [DISCONTINUED] HYDROcodone-acetaminophen (NORCO/VICODIN) 5-325 MG tablet Take 1 tablet by mouth 2 (two) times daily as needed for moderate pain.  . [DISCONTINUED] simethicone (MYLICON) 250 MG chewable tablet Chew 125 mg by mouth every 6 (six) hours as needed for flatulence.   No facility-administered medications prior to visit.    Review of Systems  Constitutional: Negative for appetite change and fatigue.  HENT: Positive for nosebleeds.   Respiratory: Negative for cough, chest tightness and shortness of breath.   Cardiovascular: Positive for leg swelling. Negative for chest pain and palpitations.        Objective    BP 118/66 (BP Location: Right Arm, Patient Position: Sitting, Cuff Size: Large)   Pulse (!) 59   Temp 98.2 F (36.8 C) (Oral)   Resp 16   Ht 5\' 11"  (1.803 m)   Wt 224 lb (101.6 kg)   SpO2 98%   BMI 31.24 kg/m    Physical Exam   General Appearance:    Mildly obese male, alert, cooperative, in no acute distress  HENT:   ENT exam normal, no neck nodes or sinus tenderness.Nasal septum hyperemic bilaterally, small scaly lesion left side of septum. No active bruising.   Eyes:    PERRL, conjunctiva/corneas clear, EOM's intact       Lungs:     Clear to auscultation bilaterally, respirations unlabored  Heart:    Bradycardic. Normal rhythm. No murmurs, rubs, or gallops.   Neurologic:   Awake, alert, oriented x 3. No apparent focal neurological           defect.         Results for orders placed or performed in visit on  12/27/20  POCT glycosylated hemoglobin (Hb A1C)  Result Value Ref Range   Hemoglobin A1C 6.6 (A) 4.0 - 5.6 %   Est. average glucose Bld gHb Est-mCnc 134     Assessment & Plan     1. Type 2 diabetes mellitus without complication, without long-term current use of insulin (HCC) A1C up slightly, but still well controlled. Continue management with diet and exercise.    2. Hypothyroidism, unspecified type - TSH  3. Hyperlipidemia, unspecified hyperlipidemia type He is tolerating atorvastatin well with no adverse effects.   - Lipid panel  4. Essential hypertension Remains well controlled since reducing amlodipine to 5mg .Still having some swelling so reduce to - amLODipine (NORVASC) 5 MG tablet; Take 0.5 tablets (2.5 mg total) by mouth daily.  Consider adding hctz if BP goes  up or edema does not improve.   5. Edema, unspecified type See above.   6. Prostate cancer screening  - PSA Total (Reflex To Free) ([facility] only)  7. Epistaxis Nasal septum is mildly hyperemic.   - PT and PTT - Platelet count  Apply OTC antibiotic ointment to septum nightly. Call for ENT referral if not improving.   8. Abnormal bruising  - PT and PTT - Platelet count  9. Vitamin D deficiency - VITAMIN D 25 Hydroxy (Vit-D Deficiency, Fractures)     The entirety of the information documented in the History of Present Illness, Review of Systems and Physical Exam were personally obtained by me. Portions of this information were initially documented by the CMA and reviewed by me for thoroughness and accuracy.      Lelon Huh, MD  [facility] [phone] (phone) [phone] (fax)  [place]

## 2020-12-28 LAB — PT AND PTT
INR: 1 (ref 0.9–1.2)
Prothrombin Time: 10.8 s (ref 9.1–12.0)
aPTT: 28 s (ref 24–33)

## 2020-12-28 LAB — PSA TOTAL (REFLEX TO FREE): Prostate Specific Ag, Serum: 3 ng/mL (ref 0.0–4.0)

## 2020-12-28 LAB — PLATELET COUNT: Platelets: 272 10*3/uL (ref 150–450)

## 2020-12-28 LAB — VITAMIN D 25 HYDROXY (VIT D DEFICIENCY, FRACTURES): Vit D, 25-Hydroxy: 24.4 ng/mL — ABNORMAL LOW (ref 30.0–100.0)

## 2020-12-28 LAB — LIPID PANEL
Chol/HDL Ratio: 3.7 ratio (ref 0.0–5.0)
Cholesterol, Total: 140 mg/dL (ref 100–199)
HDL: 38 mg/dL — ABNORMAL LOW (ref >39)
LDL Chol Calc (NIH): 87 mg/dL (ref 0–99)
Triglycerides: 74 mg/dL (ref 0–149)
VLDL Cholesterol Cal: 15 mg/dL (ref 5–40)

## 2020-12-28 LAB — TSH: TSH: 0.893 u[IU]/mL (ref 0.450–4.500)

## 2020-12-30 ENCOUNTER — Other Ambulatory Visit: Payer: Self-pay | Admitting: Family Medicine

## 2020-12-30 DIAGNOSIS — E785 Hyperlipidemia, unspecified: Secondary | ICD-10-CM

## 2020-12-30 MED ORDER — ATORVASTATIN CALCIUM 10 MG PO TABS: 10.0000 mg | ORAL_TABLET | Freq: Every evening | ORAL | 4 refills | Status: DC

## 2021-02-03 ENCOUNTER — Other Ambulatory Visit: Payer: Self-pay | Admitting: Family Medicine

## 2021-02-03 DIAGNOSIS — I10 Essential (primary) hypertension: Secondary | ICD-10-CM

## 2021-04-24 ENCOUNTER — Other Ambulatory Visit: Payer: Self-pay | Admitting: Family Medicine

## 2021-04-24 DIAGNOSIS — G4700 Insomnia, unspecified: Secondary | ICD-10-CM

## 2021-04-24 NOTE — Telephone Encounter (Signed)
Requested medication (s) are due for refill today: no  Requested medication (s) are on the active medication list: yes   Last refill:  03/13/2021  Future visit scheduled: no  Notes to clinic:  this refill cannot be delegated    Requested Prescriptions  Pending Prescriptions Disp Refills   zolpidem (AMBIEN) 10 MG tablet [Pharmacy Med Name: ZOLPIDEM TARTRATE 10 MG TAB] 60 tablet     Sig: TAKE 1 TO 2 TABLETS (10 MG TO 20 MG TOTAL) BY MOUTH AT BEDTIME AS NEEDED FOR SLEEP     Not Delegated - Psychiatry:  Anxiolytics/Hypnotics Failed - 04/24/2021 11:27 AM      Failed - This refill cannot be delegated      Failed - Urine Drug Screen completed in last 360 days      Passed - Valid encounter within last 6 months    Recent Outpatient Visits           3 months ago Type 2 diabetes mellitus without complication, without long-term current use of insulin ([place])   [facility] Birdie Sons, MD   7 months ago Type 2 diabetes mellitus without complication, without long-term current use of insulin ([place])   [facility] Birdie Sons, MD   1 year ago Type 2 diabetes mellitus without complication, without long-term current use of insulin [facility])   [facility] Birdie Sons, MD   1 year ago Type 2 diabetes mellitus without complication, without long-term current use of insulin [facility])   [facility] Birdie Sons, MD   2 years ago Essential hypertension   [place], Kirstie Peri, MD

## 2021-05-01 ENCOUNTER — Other Ambulatory Visit: Payer: Self-pay | Admitting: Family Medicine

## 2021-05-01 DIAGNOSIS — I10 Essential (primary) hypertension: Secondary | ICD-10-CM

## 2021-05-01 NOTE — Telephone Encounter (Signed)
Requested medication (s) are due for refill today - yes  Requested medication (s) are on the active medication list -yes  Future visit scheduled -yes  Last refill: 03/31/21  Notes to clinic: Dosing on RF request does not match dosing in last OV- 12/27/20- reduced to 2.5 mg- sent for review   Requested Prescriptions  Pending Prescriptions Disp Refills   amLODipine (NORVASC) 5 MG tablet [Pharmacy Med Name: AMLODIPINE BESYLATE 5 MG TAB] 90 tablet     Sig: TAKE 1 TABLET BY MOUTH DAILY     Cardiovascular:  Calcium Channel Blockers Passed - 05/01/2021  4:19 PM      Passed - Last BP in normal range    BP Readings from Last 1 Encounters:  12/27/20 118/66          Passed - Valid encounter within last 6 months    Recent Outpatient Visits           4 months ago Type 2 diabetes mellitus without complication, without long-term current use of insulin ([place])   [facility] Birdie Sons, MD   7 months ago Type 2 diabetes mellitus without complication, without long-term current use of insulin ([place])   [facility] Birdie Sons, MD   1 year ago Type 2 diabetes mellitus without complication, without long-term current use of insulin ([place])   [facility] Birdie Sons, MD   1 year ago Type 2 diabetes mellitus without complication, without long-term current use of insulin ([place])   [facility] Birdie Sons, MD   2 years ago Essential hypertension   [facility] Birdie Sons, MD                 Requested Prescriptions  Pending Prescriptions Disp Refills   amLODipine (NORVASC) 5 MG tablet [Pharmacy Med Name: AMLODIPINE BESYLATE 5 MG TAB] 90 tablet     Sig: TAKE 1 TABLET BY MOUTH DAILY     Cardiovascular:  Calcium Channel Blockers Passed - 05/01/2021  4:19 PM      Passed - Last BP in normal range    BP Readings from Last 1 Encounters:  12/27/20 118/66          Passed - Valid encounter within last 6  months    Recent Outpatient Visits           4 months ago Type 2 diabetes mellitus without complication, without long-term current use of insulin ([place])   [facility] Birdie Sons, MD   7 months ago Type 2 diabetes mellitus without complication, without long-term current use of insulin ([place])   [facility] Birdie Sons, MD   1 year ago Type 2 diabetes mellitus without complication, without long-term current use of insulin [facility])   [facility] Birdie Sons, MD   1 year ago Type 2 diabetes mellitus without complication, without long-term current use of insulin [facility])   [facility] Birdie Sons, MD   2 years ago Essential hypertension   [place], Kirstie Peri, MD

## 2021-07-08 ENCOUNTER — Encounter: Payer: Self-pay | Admitting: Family Medicine

## 2021-07-08 DIAGNOSIS — N529 Male erectile dysfunction, unspecified: Secondary | ICD-10-CM

## 2021-07-08 MED ORDER — TADALAFIL 5 MG PO TABS: 5.0000 mg | ORAL_TABLET | Freq: Every day | ORAL | 1 refills | Status: DC

## 2021-07-10 ENCOUNTER — Other Ambulatory Visit: Payer: Self-pay | Admitting: Family Medicine

## 2021-07-10 DIAGNOSIS — G4700 Insomnia, unspecified: Secondary | ICD-10-CM

## 2021-07-11 NOTE — Telephone Encounter (Signed)
Requested medications are due for refill today yes  Requested medications are on the active medication list yes  Last refill 06/05/21  Last visit 12/27/20  Future visit scheduled 08/26/21  Notes to clinic Not Delegated.  Requested Prescriptions  Pending Prescriptions Disp Refills   zolpidem (AMBIEN) 10 MG tablet [Pharmacy Med Name: ZOLPIDEM TARTRATE 10 MG TAB] 60 tablet     Sig: TAKE 1 TO 2 TABLETS (10 MG TO 20 MG TOTAL) BY MOUTH AT BEDTIME AS NEEDED FOR SLEEP     Not Delegated - Psychiatry:  Anxiolytics/Hypnotics Failed - 07/10/2021  4:11 PM      Failed - This refill cannot be delegated      Failed - Urine Drug Screen completed in last 360 days      Failed - Valid encounter within last 6 months    Recent Outpatient Visits           6 months ago Type 2 diabetes mellitus without complication, without long-term current use of insulin ([place])   [place] Birdie Sons, MD   10 months ago Type 2 diabetes mellitus without complication, without long-term current use of insulin ([place])   [facility] Birdie Sons, MD   1 year ago Type 2 diabetes mellitus without complication, without long-term current use of insulin [facility])   [facility] Birdie Sons, MD   1 year ago Type 2 diabetes mellitus without complication, without long-term current use of insulin [facility])   [facility] Birdie Sons, MD   2 years ago Essential hypertension   [facility] Birdie Sons, MD       Future Appointments             In 1 month Fisher, Kirstie Peri, MD [facility], [place]

## 2021-08-02 ENCOUNTER — Emergency Department: Payer: BC Managed Care – PPO

## 2021-08-02 ENCOUNTER — Other Ambulatory Visit: Payer: Self-pay

## 2021-08-02 ENCOUNTER — Emergency Department
Admission: EM | Admit: 2021-08-02 | Discharge: 2021-08-02 | Disposition: A | Discharge status: Home / Self Care | Payer: BC Managed Care – PPO | Attending: Emergency Medicine | Admitting: Emergency Medicine

## 2021-08-02 DIAGNOSIS — W000XXA Fall on same level due to ice and snow, initial encounter: Secondary | ICD-10-CM | POA: Insufficient documentation

## 2021-08-02 DIAGNOSIS — Z85850 Personal history of malignant neoplasm of thyroid: Secondary | ICD-10-CM | POA: Insufficient documentation

## 2021-08-02 DIAGNOSIS — W19XXXA Unspecified fall, initial encounter: Secondary | ICD-10-CM

## 2021-08-02 DIAGNOSIS — S60222A Contusion of left hand, initial encounter: Secondary | ICD-10-CM | POA: Insufficient documentation

## 2021-08-02 DIAGNOSIS — E039 Hypothyroidism, unspecified: Secondary | ICD-10-CM | POA: Diagnosis not present

## 2021-08-02 DIAGNOSIS — E1169 Type 2 diabetes mellitus with other specified complication: Secondary | ICD-10-CM | POA: Insufficient documentation

## 2021-08-02 DIAGNOSIS — M545 Low back pain, unspecified: Secondary | ICD-10-CM | POA: Insufficient documentation

## 2021-08-02 DIAGNOSIS — E785 Hyperlipidemia, unspecified: Secondary | ICD-10-CM | POA: Diagnosis not present

## 2021-08-02 DIAGNOSIS — I10 Essential (primary) hypertension: Secondary | ICD-10-CM | POA: Insufficient documentation

## 2021-08-02 DIAGNOSIS — S6992XA Unspecified injury of left wrist, hand and finger(s), initial encounter: Secondary | ICD-10-CM | POA: Diagnosis present

## 2021-08-02 DIAGNOSIS — M25561 Pain in right knee: Secondary | ICD-10-CM | POA: Diagnosis not present

## 2021-08-02 DIAGNOSIS — Z79899 Other long term (current) drug therapy: Secondary | ICD-10-CM | POA: Diagnosis not present

## 2021-08-02 MED ORDER — HYDROCODONE-ACETAMINOPHEN 5-325 MG PO TABS: 1.0000 | ORAL_TABLET | Freq: Four times a day (QID) | ORAL | 0 refills | Status: DC | PRN

## 2021-08-02 NOTE — ED Notes (Addendum)
See triage note. Pt to ED after sustaining fall and injury to left hand/wrist and right knee. Pt left hand swollen from bracing the fall. Pt c/o left hand pain, right knee pain and low back pain.

## 2021-08-02 NOTE — ED Triage Notes (Signed)
Pt to ED for fall yesterday am, slipped on ice on deck. Denies hitting head. C/o pain to lower back, right knee, left hand and left ring finer pain.  No obvious deformities.  Ambulatory with steady gait.

## 2021-08-02 NOTE — Discharge Instructions (Signed)
Follow-up with your orthopedist if any continued problems.  Wear the splint on your finger for protection and support.  Ice and elevation to reduce swelling in your hand.  A prescription for Norco (hydrocodone) was sent to your pharmacy.  Do not drive or operate machinery while taking this medication as it could cause drowsiness and increase your risk for falling.

## 2021-08-02 NOTE — ED Provider Notes (Signed)
[facility] Emergency Department Provider Note  ____________________________________________   Event Date/Time   First MD Initiated Contact with Patient 08/02/21 0902     (approximate)  I have reviewed the triage vital signs and the nursing notes.   HISTORY  Chief Complaint Fall   HPI Keith Carpenter is a 65 y.o. male presents to the ED with complaint of left hand pain, low back pain and right knee pain after falling on some ice that was on his deck yesterday.  Patient denies any head injury or loss of consciousness.  He applied ice to his hand most of yesterday and elevated with minimal reduction of swelling.  Patient believes that he is fourth digit was dislocated and he reduced the dislocation himself.  Patient states that he believes his back is hurting due to twisting as he fell.  He also has chronic problems with his right knee.  He states that range of motion in his right knee is limited due to pain.  Currently he rates pain as 5 out of 10.       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[place])    RA diagnosed in his 47's   Diabetes mellitus without complication ([place])    Gastric ulcer    H/O Guillain-Barre syndrome    Helicobacter pylori gastritis treated 07/2017   Hypertension    Hypocalcemia    Panic disorder    Schamberg disease    Thoracic aortic aneurysm 04/2014   4.4 cm [facility] at [facility]   Thyroid cancer [facility]) 2009    Patient Active Problem List   Diagnosis Date Noted   Erectile dysfunction 11/15/2019   History of adenomatous polyp of colon 04/03/2019   AV block, 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   History of MRSA infection 04/07/2018   Non-recurrent bilateral inguinal hernia without obstruction or gangrene 12/22/2017   Psoriasis (a type of skin inflammation) 12/06/2017   Lumbar herniated disc 11/16/2017   Neuropathy 01/22/2017   History of hypoparathyroidism 01/06/2016   Hypothyroidism 08/12/2015   Calcium  deficiency disease 07/12/2015   Neurocardiogenic syncope 07/12/2015   Clinical depression 05/17/2015   Psoriatic arthritis ([place]) 03/12/2015   Thoracic ascending aortic aneurysm    Essential hypertension    Vitamin D deficiency 07/15/2009   Episodic paroxysmal anxiety disorder 02/22/2008   RESTLESS LEGS SYNDROME 08/31/2007   Type 2 diabetes mellitus ([place]) 08/30/2007   Hyperlipemia 08/30/2007   History of Guillain-Barre syndrome due to influenza immunization 08/30/2007   Insomnia 08/17/2007   History of gastric ulcer 11/26/2006    Past Surgical History:  Procedure Laterality Date   CARDIAC CATHETERIZATION  06/07/2014   [facility]   COLONOSCOPY     COLONOSCOPY WITH PROPOFOL N/A 03/31/2019   Procedure: COLONOSCOPY WITH PROPOFOL;  Surgeon: [name], MD;  Location: [facility] END[facility]COPY;  Service: Gastroenterology;  Laterality: N/A;   ESOPHAGOGASTRODUODENOSCOPY (EGD) WITH PROPOFOL N/A 03/31/2019   Procedure: ESOPHAGOGASTRODUODENOSCOPY (EGD) WITH PROPOFOL;  Surgeon: [name], MD;  Location: [facility] ENDOSCOPY;  Service: Gastroenterology;  Laterality: N/A;   MENISCUS REPAIR Right    THYROIDECTOMY  2009    Prior to Admission medications   Medication Sig Start Date End Date Taking? Authorizing Provider  HYDROcodone-acetaminophen (NORCO/VICODIN) 5-325 MG tablet Take 1 tablet by mouth every 6 (six) hours as needed for moderate pain. 08/02/21 08/02/22 Yes Yanette Tripoli L, PA-C  amLODipine (NORVASC) 5 MG tablet TAKE 1 TABLET BY MOUTH DAILY 05/01/21   Birdie Sons, MD  atorvastatin (LIPITOR)  10 MG tablet Take 1 tablet (10 mg total) by mouth every evening. 12/30/20 03/25/22  Birdie Sons, MD  clobetasol ointment (TEMOVATE) 0.05 % Apply twice daily to active areas on your elbows until smooth then stop. 07/16/20   [provider]  dicyclomine (BENTYL) 10 MG capsule TAKE 1 CAPSULE BY MOUTH 4 TIMES DAILY BEFORE MEALS AND AT BEDTIME 08/12/20   [name], MD  DULoxetine (CYMBALTA) 30 MG capsule TAKE  1 CAPSULE BY MOUTH DAILY 11/14/20   Birdie Sons, MD  gabapentin (NEURONTIN) 300 MG capsule Take by mouth. 10/07/20   [provider]  levothyroxine (SYNTHROID) 125 MCG tablet Take 1 tablet (125 mcg total) by mouth daily. 09/10/20   Birdie Sons, MD  lisinopril (ZESTRIL) 5 MG tablet Take 5 mg by mouth daily.    [provider]  methotrexate 250 MG/10ML injection Inject into the skin. 07/16/20   [provider]  metoprolol tartrate (LOPRESSOR) 50 MG tablet TAKE 1 TABLET BY MOUTH TWICE A DAY 02/03/21   Birdie Sons, MD  omeprazole (PRILOSEC) 20 MG capsule TAKE 1 CAPSULE (20 MG TOTAL) BY MOUTH DAILY 03/23/19   Birdie Sons, MD  tadalafil (CIALIS) 5 MG tablet Take 1 tablet (5 mg total) by mouth daily. 07/08/21   Birdie Sons, MD  tiZANidine (ZANAFLEX) 2 MG tablet Take by mouth. 06/20/20   [provider]  triamcinolone cream (KENALOG) 0.1 %  07/22/18   [provider]  TUBERCULIN SYR 1CC/27GX1/2" 27G X 1/2" 1 ML MISC Inject into the skin. 03/26/20   [provider]  zolpidem (AMBIEN) 10 MG tablet TAKE 1 TO 2 TABLETS (10 MG TO 20 MG TOTAL) BY MOUTH AT BEDTIME AS NEEDED FOR SLEEP 07/11/21   Birdie Sons, MD    Allergies Influenza vaccines, Ixekizumab, Declomycin [demeclocycline], Demeclocycline hcl, Ginger, Glipizide, Metformin and related, Other, Prednisone, Remicade [infliximab], Suvorexant, Clarithromycin, and Pioglitazone  Family History  Problem Relation Age of Onset   Hyperlipidemia Mother    Heart attack Father 71   Hypertension Father    Hyperlipidemia Father    Heart failure Father    Prostate cancer Neg Hx    Kidney cancer Neg Hx    Bladder Cancer Neg Hx    Kidney disease Neg Hx     Social History Social History   Tobacco Use   Smoking status: Never   Smokeless tobacco: Never  Vaping Use   Vaping Use: Never used  Substance Use Topics   Alcohol use: No   Drug use: No    Review of Systems Constitutional:  No fever/chills Eyes: No visual changes. ENT: No sore throat. Cardiovascular: Denies chest pain. Respiratory: Denies shortness of breath. Gastrointestinal: No abdominal pain.  No nausea, no vomiting.  No diarrhea.   Genitourinary: Negative for dysuria. Musculoskeletal: Positive low back pain, left hand pain, right knee pain. Skin: Positive for abrasion. Neurological: Negative for headaches, focal weakness or numbness. ____________________________________________   PHYSICAL EXAM:  VITAL SIGNS: ED Triage Vitals  Enc Vitals Group     BP 08/02/21 0856 (!) 155/92     Pulse Rate 08/02/21 0856 83     Resp 08/02/21 0856 18     Temp 08/02/21 0856 98 F (36.7 C)     Temp Source 08/02/21 0856 Oral     SpO2 08/02/21 0856 96 %     Weight 08/02/21 0855 205 lb (93 kg)     Height 08/02/21 0855 5\' 11"  (1.803 m)  Head Circumference --      Peak Flow --      Pain Score 08/02/21 0854 5     Pain Loc --      Pain Edu? --      Excl. in [place]? --     Constitutional: Alert and oriented. Well appearing and in no acute distress. Eyes: Conjunctivae are normal. PERRL. EOMI. Head: Atraumatic. Nose: No trauma. Mouth/Throat: No trauma. Neck: No stridor.  Nontender cervical spine to palpation posteriorly. Cardiovascular: Normal rate, regular rhythm. Grossly normal heart sounds.  Good peripheral circulation. Respiratory: Normal respiratory effort.  No retractions. Lungs CTAB. Gastrointestinal: Soft and nontender.  Musculoskeletal: On examination of the left hand there is moderate soft tissue edema present.  Generalized tenderness.  Range of motion is restricted secondary to discomfort.  Also wrist has soft tissue edema but no appreciated deformity.  Patient is able to move all digits without any difficulty.  Motor sensory function intact.  There is a very superficial abrasion noted to the left hand as noted. Neurologic:  Normal speech and language. No gross focal neurologic deficits are appreciated.   Skin:  Skin is warm, dry.  Patient has a very superficial abrasion to his left hand without evidence of foreign body or active bleeding. Psychiatric: Mood and affect are normal. Speech and behavior are normal.  ____________________________________________   LABS (all labs ordered are listed, but only abnormal results are displayed)  Labs Reviewed - No data to display ____________________________________________ ____________________________________________  RADIOLOGY Leana Gamer, personally viewed and evaluated these images (plain radiographs) as part of my medical decision making, as well as reviewing the written report by the radiologist.   Official radiology report(s): DG Knee Complete 4 Views Right  Result Date: 08/02/2021 CLINICAL DATA:  Pt to ED for fall yesterday am, slipped on ice on deck. Denies hitting head. C/o pain to lower back, right knee, left hand and left ring finer pain. No obvious deformities. EXAM: RIGHT KNEE - COMPLETE 4+ VIEW COMPARISON:  None. FINDINGS: No evidence of fracture, dislocation, or joint effusion. Moderate tricompartmental degenerative changes. Soft tissues are unremarkable. IMPRESSION: No acute osseous abnormality of the right knee. Electronically Signed   By: Audie Pinto M.D.   On: 08/02/2021 10:43   DG Hand Complete Left  Result Date: 08/02/2021 CLINICAL DATA:  Hand pain, fall EXAM: LEFT HAND - COMPLETE 3+ VIEW COMPARISON:  None. FINDINGS: No acute fracture or dislocation visualized. Multifocal joint space narrowing with marginal osteophyte formation, most significant at the first interphalangeal joint, metacarpophalangeal joint, and fifth proximal interphalangeal joint. Diffuse soft tissue swelling of the hand most prominent dorsally. No radiopaque foreign body visualized. IMPRESSION: 1. Soft tissue swelling of the hand. 2. No acute fracture identified.  Osteoarthritic changes. Electronically Signed   By: Ofilia Neas M.D.   On:  08/02/2021 09:48    ____________________________________________   PROCEDURES  Procedure(s) performed (including Critical Care):  Procedures   ____________________________________________   INITIAL IMPRESSION / ASSESSMENT AND PLAN / ED COURSE  As part of my medical decision making, I reviewed the following data within the electronic MEDICAL RECORD NUMBER Notes from prior ED visits and [place] Controlled Substance Database  65 year old male presents to the ED after a fall yesterday that occurred due to ice at his home.  Patient has continued to have left hand pain and swelling despite ice and elevation yesterday.  He also has had some discomfort in his right knee for which he has chronic issues with and sees  Dr. Rudene Christians for this.  X-rays of the left hand was negative for fracture.  Patient by report had a dislocated fourth digit which he reduced at home.  This is an alignment.  Right knee is negative for acute bony injury.  Patient currently has a wrist brace and a finger splint that he has been wearing since yesterday.  He is to continue with ice and elevation.  He already has an appointment with Dr. Rudene Christians for knee issues prior to his fall.  A prescription for hydrocodone was sent to his pharmacy.  Patient was ambulatory at the time of his discharge.  ____________________________________________   FINAL CLINICAL IMPRESSION(S) / ED DIAGNOSES  Final diagnoses:  Contusion of left hand, initial encounter  Acute pain of right knee  Fall, initial encounter     ED Discharge Orders          Ordered    HYDROcodone-acetaminophen (NORCO/VICODIN) 5-325 MG tablet  Every 6 hours PRN        08/02/21 1053             Note:  This document was prepared using Dragon voice recognition software and may include unintentional dictation errors.    Johnn Hai, PA-C 08/02/21 1157    Carrie Mew, MD 08/02/21 1415

## 2021-08-04 ENCOUNTER — Telehealth: Payer: Self-pay | Admitting: Family Medicine

## 2021-08-04 ENCOUNTER — Encounter: Payer: Self-pay | Admitting: Family Medicine

## 2021-08-04 DIAGNOSIS — S39012A Strain of muscle, fascia and tendon of lower back, initial encounter: Secondary | ICD-10-CM

## 2021-08-04 DIAGNOSIS — S86911A Strain of unspecified muscle(s) and tendon(s) at lower leg level, right leg, initial encounter: Secondary | ICD-10-CM

## 2021-08-04 NOTE — Telephone Encounter (Signed)
Referral placed.

## 2021-08-04 NOTE — Telephone Encounter (Signed)
Copied from [place] [phone]. Topic: Referral - Request for Referral >> Aug 04, 2021  9:01 AM Leward Quan A wrote: Has patient seen PCP for this complaint? No. *If NO, is insurance requiring patient see PCP for this issue before PCP can refer them? Referral for which specialty: Orthopedic PT  Preferred provider/office  [facility]  Reason for referral: Fall causing damage to knee and back

## 2021-08-18 ENCOUNTER — Ambulatory Visit: Payer: Self-pay | Admitting: *Deleted

## 2021-08-18 ENCOUNTER — Telehealth: Payer: Self-pay | Admitting: Family Medicine

## 2021-08-18 NOTE — Telephone Encounter (Signed)
Ok to see if any providers have openings this week

## 2021-08-18 NOTE — Telephone Encounter (Signed)
Patient scheduled for tomorrow with Dr. Caryn Section

## 2021-08-18 NOTE — Telephone Encounter (Signed)
Reason for Disposition  [1] MODERATE dizziness (e.g., interferes with normal activities) AND [2] has NOT been evaluated by physician for this  (Exception: dizziness caused by heat exposure, sudden standing, or poor fluid intake)  Answer Assessment - Initial Assessment Questions 1. DESCRIPTION: "Describe your dizziness."     Lightheaded  2. LIGHTHEADED: "Do you feel lightheaded?" (e.g., somewhat faint, woozy, weak upon standing)    Somewhat faint  3. VERTIGO: "Do you feel like either you or the room is spinning or tilting?" (i.e. vertigo)     Room spinning one hour ago  4. SEVERITY: "How bad is it?"  "Do you feel like you are going to faint?" "Can you stand and walk?"   - MILD: Feels slightly dizzy, but walking normally.   - MODERATE: Feels unsteady when walking, but not falling; interferes with normal activities (e.g., school, work).   - SEVERE: Unable to walk without falling, or requires assistance to walk without falling; feels like passing out now.      Moderate  5. ONSET:  "When did the dizziness begin?"     Last week  6. AGGRAVATING FACTORS: "Does anything make it worse?" (e.g., standing, change in head position)     Standing  7. HEART RATE: "Can you tell me your heart rate?" "How many beats in 15 seconds?"  (Note: not all patients can do this)       na 8. CAUSE: "What do you think is causing the dizziness?"     Not sure  9. RECURRENT SYMPTOM: "Have you had dizziness before?" If Yes, ask: "When was the last time?" "What happened that time?"     Yes last week  10. OTHER SYMPTOMS: "Do you have any other symptoms?" (e.g., fever, chest pain, vomiting, diarrhea, bleeding)       Shortness of breath with walking  11. PREGNANCY: "Is there any chance you are pregnant?" "When was your last menstrual period?"       na  Protocols used: Dizziness - Lightheadedness-A-AH

## 2021-08-18 NOTE — Telephone Encounter (Signed)
Patient called in to relay covid test was negative

## 2021-08-18 NOTE — Telephone Encounter (Signed)
C/o dizziness, lightheadedness, nausea headache, and ears clogged. C/o sx last week nausea, headache, sneezing, coughing and felt better and now sx returned. Denies fever. C/o fatigue, balance off since last week. Blood glucose 119 this am B/P 144/77 heart rate 67 at 2:25pm. C/o shortness of breath with walking today and had to leave work early due to symptoms.. feels like passing out x 1 today . Dizziness worse standing. Hx aortic aneurysm and guillain barre . Recommended patient to go to ED now or call 911 if symptoms worsen. Patient requesting talk to PCP or get appt with another provider at clinic and if symptoms worsen go to ED. Denies chest pain difficulty breathing . Patient reports he is going to take a covid test to R/O covid . Care advise given. Patient verbalized understanding of care advise and to call back or go to [facility] or ED or call 911 if symptoms worsen. Please advise

## 2021-08-18 NOTE — Telephone Encounter (Signed)
Noted. Please see other telephone encounter

## 2021-08-19 ENCOUNTER — Encounter: Payer: Self-pay | Admitting: Family Medicine

## 2021-08-19 ENCOUNTER — Other Ambulatory Visit: Payer: Self-pay

## 2021-08-19 ENCOUNTER — Ambulatory Visit: Payer: BC Managed Care – PPO | Admitting: Family Medicine

## 2021-08-19 VITALS — BP 128/64 | HR 61 | Temp 98.6°F | Resp 18 | Wt 213.0 lb

## 2021-08-19 DIAGNOSIS — E119 Type 2 diabetes mellitus without complications: Secondary | ICD-10-CM

## 2021-08-19 DIAGNOSIS — E039 Hypothyroidism, unspecified: Secondary | ICD-10-CM

## 2021-08-19 DIAGNOSIS — N529 Male erectile dysfunction, unspecified: Secondary | ICD-10-CM | POA: Diagnosis not present

## 2021-08-19 DIAGNOSIS — R5381 Other malaise: Secondary | ICD-10-CM

## 2021-08-19 DIAGNOSIS — R5383 Other fatigue: Secondary | ICD-10-CM

## 2021-08-19 DIAGNOSIS — I10 Essential (primary) hypertension: Secondary | ICD-10-CM | POA: Diagnosis not present

## 2021-08-19 DIAGNOSIS — G4700 Insomnia, unspecified: Secondary | ICD-10-CM

## 2021-08-19 MED ORDER — TADALAFIL 10 MG PO TABS: 10.0000 mg | ORAL_TABLET | Freq: Every day | ORAL | 2 refills | Status: DC

## 2021-08-19 NOTE — Progress Notes (Signed)
Established patient visit   Patient: Keith Carpenter   DOB: [birth year]   65 y.o. Male  MRN: [id number] Visit Date: 08/19/2021  Today's healthcare provider: Lelon Huh, MD   Chief Complaint  Patient presents with   Dizziness   Subjective    Dizziness This is a new problem. Episode onset: 1 week ago. Associated symptoms include chills, diaphoresis, fatigue, headaches, nausea (resolved), numbness and a sore throat (left side). Pertinent negatives include no abdominal pain, chest pain, fever or vomiting. Associated symptoms comments: Near syncope feeling. The symptoms are aggravated by standing. Treatments tried: Loratidine and Tylenol. The treatment provided no relief.   Home COVID test done on 08/18/2021 was negative.  He felt better over the weekend, but worse the last 24 hours. Much more fatigued and somewhat light headed. 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    He is also due for follow up of multiple chronic problems including diabetes, hypertension, and hypothyroidism.  Lab Results  Component Value Date   HGBA1C 6.6 (A) 12/27/2020    Lab Results  Component Value Date   TSH 0.893 12/27/2020    Lab Results  Component Value Date   CHOL 140 12/27/2020   HDL 38 (L) 12/27/2020   LDLCALC 87 12/27/2020   TRIG 74 12/27/2020   CHOLHDL 3.7 12/27/2020      He also states the Cialis 5mg  daily has not been very effective for ED. He will briefly have an erection but does goes away before able to have satisfying intercourse. He has had low testosterone in the past, but not currently on testosterone replacement.   Medications: Outpatient Medications Prior to Visit  Medication Sig   amLODipine (NORVASC) 5 MG tablet TAKE 1 TABLET BY MOUTH DAILY   atorvastatin (LIPITOR) 10 MG tablet Take 1 tablet (10 mg total) by mouth every evening.   clobetasol ointment (TEMOVATE) 0.05 % Apply twice daily to active areas on your elbows until smooth then stop.   dicyclomine  (BENTYL) 10 MG capsule TAKE 1 CAPSULE BY MOUTH 4 TIMES DAILY BEFORE MEALS AND AT BEDTIME   DULoxetine (CYMBALTA) 30 MG capsule TAKE 1 CAPSULE BY MOUTH DAILY   gabapentin (NEURONTIN) 300 MG capsule Take by mouth.   HYDROcodone-acetaminophen (NORCO/VICODIN) 5-325 MG tablet Take 1 tablet by mouth every 6 (six) hours as needed for moderate pain.   levothyroxine (SYNTHROID) 125 MCG tablet Take 1 tablet (125 mcg total) by mouth daily.   lisinopril (ZESTRIL) 5 MG tablet Take 5 mg by mouth daily.   methotrexate 250 MG/10ML injection Inject into the skin.   metoprolol tartrate (LOPRESSOR) 50 MG tablet TAKE 1 TABLET BY MOUTH TWICE A DAY   omeprazole (PRILOSEC) 20 MG capsule TAKE 1 CAPSULE (20 MG TOTAL) BY MOUTH DAILY   tadalafil (CIALIS) 5 MG tablet Take 1 tablet (5 mg total) by mouth daily.   triamcinolone cream (KENALOG) 0.1 %    TUBERCULIN SYR 1CC/27GX1/2" 27G X 1/2" 1 ML MISC Inject into the skin.   zolpidem (AMBIEN) 10 MG tablet TAKE 1 TO 2 TABLETS (10 MG TO 20 MG TOTAL) BY MOUTH AT BEDTIME AS NEEDED FOR SLEEP   tiZANidine (ZANAFLEX) 2 MG tablet Take by mouth. (Patient not taking: Reported on 08/19/2021)   No facility-administered medications prior to visit.    Review of Systems  Constitutional:  Positive for chills, diaphoresis and fatigue. Negative for appetite change and fever.  HENT:  Positive for postnasal drip and sore throat (left side).  Ears feel clogged  Respiratory:  Positive for chest tightness. Negative for shortness of breath and wheezing.   Cardiovascular:  Negative for chest pain and palpitations.  Gastrointestinal:  Positive for nausea (resolved). Negative for abdominal pain and vomiting.  Neurological:  Positive for dizziness, light-headedness, numbness and headaches.      Objective    BP 128/64 (BP Location: Right Arm, Patient Position: Sitting, Cuff Size: Large)   Pulse 61   Temp 98.6 F (37 C) (Oral)   Resp 18   Wt 213 lb (96.6 kg)   SpO2 98% Comment: room air   BMI 29.71 kg/m  {Show previous vital signs (optional):23777}  Physical Exam   General: Appearance:     Well developed, well nourished male in no acute distress  Eyes:    PERRL, conjunctiva/corneas clear, EOM's intact       Lungs:     Clear to auscultation bilaterally, respirations unlabored  Heart:    Normal heart rate. Normal rhythm. No murmurs, rubs, or gallops.    MS:   All extremities are intact.    Neurologic:   Awake, alert, oriented x 3. No apparent focal neurological defect.          Assessment & Plan     1. Malaise and fatigue History is c/w viral syndrome last week which started to improve a few days ago. May have been influenza.  - TSH - T4, free  2. Type 2 diabetes mellitus without complication, without long-term current use of insulin (HCC)  - Hemoglobin A1c  3. Essential hypertension Well controlled.  Continue current medications.   - CBC with Differential/Platelet - Comprehensive metabolic panel  4. Erectile dysfunction, unspecified erectile dysfunction type Uncontrolled on 5mg  daily Cialis, increase to  tadalafil (CIALIS) 10 MG tablet; Take 1 tablet (10 mg total) by mouth daily.  Dispense: 30 tablet; Refill: 2 - Testosterone,Free and Total  5. Hypothyroidism, unspecified type   6. Insomnia He states that zolpidem is working fairly well, but pharmacy told him he would probably need PA complete before his next refill is due in January.        The entirety of the information documented in the History of Present Illness, Review of Systems and Physical Exam were personally obtained by me. Portions of this information were initially documented by the CMA and reviewed by me for thoroughness and accuracy.     Lelon Huh, MD  [facility] [phone] (phone) [phone] (fax)  [place]

## 2021-08-21 ENCOUNTER — Telehealth: Payer: Self-pay

## 2021-08-21 ENCOUNTER — Other Ambulatory Visit: Payer: Self-pay | Admitting: Family Medicine

## 2021-08-21 DIAGNOSIS — E291 Testicular hypofunction: Secondary | ICD-10-CM

## 2021-08-21 LAB — COMPREHENSIVE METABOLIC PANEL
ALT: 23 IU/L (ref 0–44)
AST: 20 IU/L (ref 0–40)
Albumin/Globulin Ratio: 1.8 (ref 1.2–2.2)
Albumin: 4.4 g/dL (ref 3.8–4.8)
Alkaline Phosphatase: 78 IU/L (ref 44–121)
BUN/Creatinine Ratio: 14 (ref 10–24)
BUN: 13 mg/dL (ref 8–27)
Bilirubin Total: 0.4 mg/dL (ref 0.0–1.2)
CO2: 24 mmol/L (ref 20–29)
Calcium: 8.5 mg/dL — ABNORMAL LOW (ref 8.6–10.2)
Chloride: 101 mmol/L (ref 96–106)
Creatinine, Ser: 0.91 mg/dL (ref 0.76–1.27)
Globulin, Total: 2.5 g/dL (ref 1.5–4.5)
Glucose: 85 mg/dL (ref 70–99)
Potassium: 4.4 mmol/L (ref 3.5–5.2)
Sodium: 139 mmol/L (ref 134–144)
Total Protein: 6.9 g/dL (ref 6.0–8.5)
eGFR: 95 mL/min/{1.73_m2} (ref >59)

## 2021-08-21 LAB — CBC WITH DIFFERENTIAL/PLATELET
Basophils Absolute: 0 10*3/uL (ref 0.0–0.2)
Basos: 0 %
EOS (ABSOLUTE): 0.2 10*3/uL (ref 0.0–0.4)
Eos: 5 %
Hematocrit: 42 % (ref 37.5–51.0)
Hemoglobin: 14.3 g/dL (ref 13.0–17.7)
Immature Grans (Abs): 0 10*3/uL (ref 0.0–0.1)
Immature Granulocytes: 0 %
Lymphocytes Absolute: 1.4 10*3/uL (ref 0.7–3.1)
Lymphs: 32 %
MCH: 30.4 pg (ref 26.6–33.0)
MCHC: 34 g/dL (ref 31.5–35.7)
MCV: 89 fL (ref 79–97)
Monocytes Absolute: 0.5 10*3/uL (ref 0.1–0.9)
Monocytes: 11 %
Neutrophils Absolute: 2.3 10*3/uL (ref 1.4–7.0)
Neutrophils: 52 %
Platelets: 253 10*3/uL (ref 150–450)
RBC: 4.71 x10E6/uL (ref 4.14–5.80)
RDW: 13.2 % (ref 11.6–15.4)
WBC: 4.5 10*3/uL (ref 3.4–10.8)

## 2021-08-21 LAB — TESTOSTERONE,FREE AND TOTAL
Testosterone, Free: 4.3 pg/mL — ABNORMAL LOW (ref 6.6–18.1)
Testosterone: 237 ng/dL — ABNORMAL LOW (ref 264–916)

## 2021-08-21 LAB — HEMOGLOBIN A1C
Est. average glucose Bld gHb Est-mCnc: 126 mg/dL
Hgb A1c MFr Bld: 6 % — ABNORMAL HIGH (ref 4.8–5.6)

## 2021-08-21 LAB — TSH: TSH: 1.36 u[IU]/mL (ref 0.450–4.500)

## 2021-08-21 LAB — T4, FREE: Free T4: 1.27 ng/dL (ref 0.82–1.77)

## 2021-08-21 NOTE — Telephone Encounter (Signed)
Copied from [place] [id number]. Topic: General - Other >> Aug 21, 2021  4:09 PM Pawlus, Brayton Layman A wrote: Reason for CRM: Pt called to go over latest lab results, please advise.

## 2021-08-21 NOTE — Telephone Encounter (Signed)
Dr. Caryn Section sent patient a mychart message about his lab results. Tried calling patient. Left message to call back. OK for PEC Triage to speak with patient about lab results.

## 2021-08-22 NOTE — Telephone Encounter (Signed)
Patient notified of lab result and provider response. Patient has appointment next week and has some other things to discuss with PCP- will have labs drawn then.

## 2021-08-25 NOTE — Telephone Encounter (Signed)
Apt rescheduled for 11/24/2021 at 3:20pm   Thanks,   -Mickel Baas

## 2021-08-25 NOTE — Telephone Encounter (Signed)
Pt calling back stated needed to reschedule appointment according to his My Chart message from PCP advised pt that the appointment tomorrow was to address his other health issues and to draw labs. Pt requested call back from nurse for clarification.   Please advice.

## 2021-08-26 ENCOUNTER — Ambulatory Visit: Payer: BC Managed Care – PPO | Admitting: Family Medicine

## 2021-09-16 ENCOUNTER — Other Ambulatory Visit: Payer: Self-pay | Admitting: Family Medicine

## 2021-09-16 DIAGNOSIS — G4700 Insomnia, unspecified: Secondary | ICD-10-CM

## 2021-09-16 MED ORDER — ZOLPIDEM TARTRATE 10 MG PO TABS: ORAL_TABLET | ORAL | 5 refills | Status: DC

## 2021-10-02 ENCOUNTER — Other Ambulatory Visit: Payer: Self-pay | Admitting: Family Medicine

## 2021-10-02 DIAGNOSIS — E039 Hypothyroidism, unspecified: Secondary | ICD-10-CM

## 2021-10-02 NOTE — Telephone Encounter (Signed)
Requested medication (s) are due for refill today: Yes  Requested medication (s) are on the active medication list: Yes  Last refill:  09/10/20  Future visit scheduled: Yes  Notes to clinic:  Prescription expired.    Requested Prescriptions  Pending Prescriptions Disp Refills   levothyroxine (SYNTHROID) 125 MCG tablet [Pharmacy Med Name: LEVOTHYROXINE SODIUM 125 MCG TAB] 90 tablet 4    Sig: TAKE 1 TABLET BY MOUTH DAILY     Endocrinology:  Hypothyroid Agents Failed - 10/02/2021 11:23 AM      Failed - TSH needs to be rechecked within 3 months after an abnormal result. Refill until TSH is due.      Passed - TSH in normal range and within 360 days    TSH  Date Value Ref Range Status  08/19/2021 1.360 0.450 - 4.500 uIU/mL Final          Passed - Valid encounter within last 12 months    Recent Outpatient Visits           1 month ago [place] and fatigue   [facility] Birdie Sons, MD   9 months ago Type 2 diabetes mellitus without complication, without long-term current use of insulin ([place])   [facility] Birdie Sons, MD   1 year ago Type 2 diabetes mellitus without complication, without long-term current use of insulin ([place])   [facility] Birdie Sons, MD   1 year ago Type 2 diabetes mellitus without complication, without long-term current use of insulin [facility])   [facility] Birdie Sons, MD   2 years ago Type 2 diabetes mellitus without complication, without long-term current use of insulin [facility])   [facility] Birdie Sons, MD       Future Appointments             In 1 month Fisher, Kirstie Peri, MD [facility] [place]

## 2021-10-16 ENCOUNTER — Other Ambulatory Visit: Payer: Self-pay | Admitting: Family Medicine

## 2021-10-16 DIAGNOSIS — F32A Depression, unspecified: Secondary | ICD-10-CM

## 2021-10-16 NOTE — Telephone Encounter (Signed)
Requested medications are due for refill today.  yes  Requested medications are on the active medications list.  yes  Last refill. 11/14/2020  Future visit scheduled.   yes  Notes to clinic.  Failed protocol  - No PHQ-2 or PHQ-9 completed in last 360 days.    Requested Prescriptions  Pending Prescriptions Disp Refills   DULoxetine (CYMBALTA) 30 MG capsule [Pharmacy Med Name: DULOXETINE HCL 30 MG CAP] 30 capsule 4    Sig: TAKE 1 CAPSULE BY MOUTH DAILY     Psychiatry: Antidepressants - SNRI - duloxetine Failed - 10/16/2021  4:21 PM      Failed - Completed PHQ-2 or PHQ-9 in the last 360 days      Passed - Cr in normal range and within 360 days    Creat  Date Value Ref Range Status  07/20/2017 0.93 0.70 - 1.33 mg/dL Final    Comment:    For patients >68 years of age, the reference limit for Creatinine is approximately 13% higher for people identified as African-American. .    Creatinine, Ser  Date Value Ref Range Status  08/19/2021 0.91 0.76 - 1.27 mg/dL Final          Passed - eGFR is 30 or above and within 360 days    GFR, Est African American  Date Value Ref Range Status  07/20/2017 104 > OR = 60 mL/min/1.81m Final   GFR calc Af Amer  Date Value Ref Range Status  11/15/2019 90 >59 mL/min/1.73 Final   GFR, Est Non African American  Date Value Ref Range Status  07/20/2017 90 > OR = 60 mL/min/1.785mFinal   GFR calc non Af Amer  Date Value Ref Range Status  11/15/2019 78 >59 mL/min/1.73 Final   eGFR  Date Value Ref Range Status  08/19/2021 95 >59 mL/min/1.73 Final          Passed - Last BP in normal range    BP Readings from Last 1 Encounters:  08/19/21 128/64          Passed - Valid encounter within last 6 months    Recent Outpatient Visits           [place]   [facility]   9 months ago Type 2 diabetes mellitus without complication, without long-term current use of insulin ([place]   [facility]   1 year ago Type 2 diabetes mellitus without complication, without long-term current use of insulin ([place]  [facility]   1 year ago Type 2 diabetes mellitus without complication, without long-term current use of insulin ([facility]  [facility]   2 years ago Type 2 diabetes mellitus without complication, without long-term current use of insulin ([facility]  [facility]       Future Appointments             In 1 month Fisher, DoKirstie PeriMD [facility]

## 2021-11-04 ENCOUNTER — Encounter: Payer: Self-pay | Admitting: Family Medicine

## 2021-11-20 ENCOUNTER — Other Ambulatory Visit: Payer: Self-pay | Admitting: Family Medicine

## 2021-11-20 DIAGNOSIS — I10 Essential (primary) hypertension: Secondary | ICD-10-CM

## 2021-11-24 ENCOUNTER — Other Ambulatory Visit: Payer: Self-pay

## 2021-11-24 ENCOUNTER — Ambulatory Visit: Payer: BC Managed Care – PPO | Admitting: Family Medicine

## 2021-11-24 ENCOUNTER — Encounter: Payer: Self-pay | Admitting: Family Medicine

## 2021-11-24 VITALS — BP 129/53 | HR 60 | Temp 98.2°F | Resp 14 | Wt 202.0 lb

## 2021-11-24 DIAGNOSIS — E291 Testicular hypofunction: Secondary | ICD-10-CM | POA: Diagnosis not present

## 2021-11-24 DIAGNOSIS — E119 Type 2 diabetes mellitus without complications: Secondary | ICD-10-CM | POA: Diagnosis not present

## 2021-11-24 DIAGNOSIS — G4700 Insomnia, unspecified: Secondary | ICD-10-CM

## 2021-11-24 DIAGNOSIS — F513 Sleepwalking [somnambulism]: Secondary | ICD-10-CM

## 2021-11-24 LAB — POCT GLYCOSYLATED HEMOGLOBIN (HGB A1C)
Est. average glucose Bld gHb Est-mCnc: 120
Hemoglobin A1C: 5.8 % — AB (ref 4.0–5.6)

## 2021-11-24 MED ORDER — PREDNISONE 10 MG PO TABS: ORAL_TABLET | ORAL | 0 refills | Status: AC

## 2021-11-24 NOTE — Progress Notes (Signed)
I,Roshena L Chambers,acting as a scribe for Mila Merry, MD.,have documented all relevant documentation on the behalf of Mila Merry, MD,as directed by  Mila Merry, MD while in the presence of Mila Merry, MD.   Established patient visit   Patient: Keith Carpenter   DOB: November 01, 1955   66 y.o. Male  MRN: [id number] Visit Date: 11/24/2021  Today's healthcare provider: Mila Merry, MD   Chief Complaint  Patient presents with   Diabetes   Hypertension   Subjective    HPI  Diabetes Mellitus Type II, Follow-up  Lab Results  Component Value Date   HGBA1C 6.0 (H) 08/19/2021   HGBA1C 6.6 (A) 12/27/2020   HGBA1C 6.3 (A) 09/10/2020   Wt Readings from Last 3 Encounters:  11/24/21 202 lb (91.6 kg)  08/19/21 213 lb (96.6 kg)  08/02/21 205 lb (93 kg)   Last seen for diabetes 3 months ago.  Management since then includes continue lifestyle modifications. He reports good compliance with treatment. He is not having side effects.  Symptoms: No fatigue No foot ulcerations  No appetite changes No nausea  No paresthesia of the feet  No polydipsia  No polyuria No visual disturbances   No vomiting     Home blood sugar records:  blood sugars are not checked  Episodes of hypoglycemia? No    Current insulin regiment: none Most Recent Eye Exam: >1 year ago Current exercise: walking Current diet habits: well balanced Patient reports that he has been sleep walking at night and eating an entire box of ice cream sandwiches, or half a sandwich. Patient states he does not remember doing this but finds empty wrappers in the mornings.   Pertinent Labs: Lab Results  Component Value Date   CHOL 140 12/27/2020   HDL 38 (L) 12/27/2020   LDLCALC 87 12/27/2020   TRIG 74 12/27/2020   CHOLHDL 3.7 12/27/2020   Lab Results  Component Value Date   NA 139 08/19/2021   K 4.4 08/19/2021   CREATININE 0.91 08/19/2021   EGFR 95 08/19/2021   MICROALBUR 20 03/03/2019      ---------------------------------------------------------------------------------------------------   Hypertension, follow-up  BP Readings from Last 3 Encounters:  11/24/21 (!) 129/53  08/19/21 128/64  08/02/21 (!) 155/92   Wt Readings from Last 3 Encounters:  11/24/21 202 lb (91.6 kg)  08/19/21 213 lb (96.6 kg)  08/02/21 205 lb (93 kg)     He was last seen for hypertension 3 months ago.  BP at that visit was 128/64. Management since that visit includes continue same medication.  He reports good compliance with treatment. He is not having side effects.  He is following a Regular diet. He is exercising. He does not smoke.  Use of agents associated with hypertension: thyroid hormones.   Outside blood pressures are 138/84. Symptoms: No chest pain No chest pressure  No palpitations No syncope  No dyspnea No orthopnea  No paroxysmal nocturnal dyspnea No lower extremity edema   Pertinent labs: Lab Results  Component Value Date   CHOL 140 12/27/2020   HDL 38 (L) 12/27/2020   LDLCALC 87 12/27/2020   TRIG 74 12/27/2020   CHOLHDL 3.7 12/27/2020   Lab Results  Component Value Date   NA 139 08/19/2021   K 4.4 08/19/2021   CREATININE 0.91 08/19/2021   EGFR 95 08/19/2021   GLUCOSE 85 08/19/2021   TSH 1.360 08/19/2021     The 10-year ASCVD risk score (Arnett DK, et al., 2019) is: 21.2%   ---------------------------------------------------------------------------------------------------  Follow up for erectile dysfunction:  The patient was last seen for this 3 months ago. Changes made at last visit include increasing Cialis from 5mg  daily to 10mg  daily.  He reports good compliance with treatment. He feels that condition is Unchanged. He is not having side effects.   Testosterone levels were checked in December and found to be low. He was instructed to return to lab to recheck testosterone, but he has not yet been able to get to lab yet.    -----------------------------------------------------------------------------------------   Medications: Outpatient Medications Prior to Visit  Medication Sig   amLODipine (NORVASC) 5 MG tablet TAKE 1 TABLET BY MOUTH DAILY   atorvastatin (LIPITOR) 10 MG tablet Take 1 tablet (10 mg total) by mouth every evening.   clobetasol ointment (TEMOVATE) 0.05 % Apply twice daily to active areas on your elbows until smooth then stop.   dicyclomine (BENTYL) 10 MG capsule TAKE 1 CAPSULE BY MOUTH 4 TIMES DAILY BEFORE MEALS AND AT BEDTIME   DULoxetine (CYMBALTA) 30 MG capsule TAKE 1 CAPSULE BY MOUTH DAILY   gabapentin (NEURONTIN) 300 MG capsule Take by mouth.   HYDROcodone-acetaminophen (NORCO/VICODIN) 5-325 MG tablet Take 1 tablet by mouth every 6 (six) hours as needed for moderate pain.   levothyroxine (SYNTHROID) 125 MCG tablet TAKE 1 TABLET BY MOUTH DAILY   lisinopril (ZESTRIL) 5 MG tablet Take 5 mg by mouth daily.   methotrexate 250 MG/10ML injection Inject into the skin.   metoprolol tartrate (LOPRESSOR) 50 MG tablet TAKE 1 TABLET BY MOUTH TWICE A DAY   omeprazole (PRILOSEC) 20 MG capsule TAKE 1 CAPSULE (20 MG TOTAL) BY MOUTH DAILY   tadalafil (CIALIS) 10 MG tablet Take 1 tablet (10 mg total) by mouth daily.   tiZANidine (ZANAFLEX) 2 MG tablet Take by mouth.   triamcinolone cream (KENALOG) 0.1 %    TUBERCULIN SYR 1CC/27GX1/2" 27G X 1/2" 1 ML MISC Inject into the skin.   zolpidem (AMBIEN) 10 MG tablet TAKE 1 TO 2 TABLETS (10 MG TO 20 MG TOTAL) BY MOUTH AT BEDTIME AS NEEDED FOR SLEEP   No facility-administered medications prior to visit.    Review of Systems  Constitutional:  Negative for appetite change, chills and fever.  Respiratory:  Negative for chest tightness, shortness of breath and wheezing.   Cardiovascular:  Negative for chest pain and palpitations.  Gastrointestinal:  Negative for abdominal pain, nausea and vomiting.      Objective    BP (!) 129/53 (BP Location: Right Arm,  Patient Position: Sitting, Cuff Size: Large)   Pulse 60   Temp 98.2 F (36.8 C) (Oral)   Resp 14   Wt 202 lb (91.6 kg)   SpO2 99% Comment: room air  BMI 28.17 kg/m    Physical Exam   General: Appearance:     Well developed, well nourished male in no acute distress  Eyes:    PERRL, conjunctiva/corneas clear, EOM's intact       Lungs:     Clear to auscultation bilaterally, respirations unlabored  Heart:    Normal heart rate. Normal rhythm. No murmurs, rubs, or gallops.    MS:   All extremities are intact.    Neurologic:   Awake, alert, oriented x 3. No apparent focal neurological defect.        Results for orders placed or performed in visit on 11/24/21  POCT HgB A1C  Result Value Ref Range   Hemoglobin A1C 5.8 (A) 4.0 - 5.6 %   Est. average glucose  Bld gHb Est-mCnc 120      Assessment & Plan     1. Type 2 diabetes mellitus without complication, without long-term current use of insulin (HCC) Very well controlled with diet.  - Urine Albumin-Creatinine with uACR  2. Hypogonadism in male Need to repeat testosterone levels before prescribing testosterone replacement.  - Testosterone,Free and Total  3. Insomnia, unspecified type Well controlled on Zolpidem for years, but now having sleep walking as below.   4. Sleep walking Currently only have a few episodes a month of fixing food and not remembering. Advised that this is known side effect of Ambien. He is going to try cutting back on Ambien dose, and advised we could also try low dose of benzodiazepine such temazepam if this continues to be a problem.   5. Eczema He states prednisone has worked very well in the past - predniSONE (DELTASONE) 10 MG tablet; 6 tablets for 1 day, then 5 for 1 day, then 4 for 1 day, then 3 for 1 day, then 2 for 1 day then 1 for 1 day.  Dispense: 21 tablet; Refill: 0       The entirety of the information documented in the History of Present Illness, Review of Systems and Physical Exam were  personally obtained by me. Portions of this information were initially documented by the CMA and reviewed by me for thoroughness and accuracy.     Mila Merry, MD  [facility] [phone] (phone) [phone] (fax)  [facility]

## 2021-11-29 ENCOUNTER — Other Ambulatory Visit: Payer: Self-pay | Admitting: Gastroenterology

## 2021-11-30 LAB — TESTOSTERONE,FREE AND TOTAL
Testosterone, Free: 3.8 pg/mL — ABNORMAL LOW (ref 6.6–18.1)
Testosterone: 315 ng/dL (ref 264–916)

## 2021-11-30 LAB — MICROALBUMIN / CREATININE URINE RATIO
Creatinine, Urine: 105.8 mg/dL
Microalb/Creat Ratio: 16 mg/g creat (ref 0–29)
Microalbumin, Urine: 17.2 ug/mL

## 2021-12-01 ENCOUNTER — Other Ambulatory Visit: Payer: Self-pay | Admitting: Family Medicine

## 2021-12-01 ENCOUNTER — Other Ambulatory Visit: Payer: Self-pay

## 2021-12-01 DIAGNOSIS — E291 Testicular hypofunction: Secondary | ICD-10-CM

## 2021-12-01 MED ORDER — TESTOSTERONE 20.25 MG/ACT (1.62%) TD GEL: 2.0000 | Freq: Every day | TRANSDERMAL | 1 refills | Status: DC

## 2021-12-01 NOTE — Addendum Note (Signed)
Addended by: Randal Buba on: 12/01/2021 04:39 PM ? ? Modules accepted: Orders ? ?

## 2021-12-01 NOTE — Telephone Encounter (Signed)
Copied from [place] [id number]. Topic: Quick Communication - Rx Refill/Question ?>> Dec 01, 2021  2:55 PM Pawlus, Brayton Layman A wrote: ?Pharmacist called in stating Testosterone 20.25 MG/ACT (1.62%) GEL does not come in 45g but needs to be re-written to be for 75g, caller was unable to fill this Rx for the patient. ?

## 2021-12-02 MED ORDER — TESTOSTERONE 20.25 MG/ACT (1.62%) TD GEL: 2.0000 | Freq: Every day | TRANSDERMAL | 1 refills | Status: DC

## 2021-12-02 NOTE — Addendum Note (Signed)
Addended by: Birdie Sons on: 12/02/2021 07:57 AM ? ? Modules accepted: Orders ? ?

## 2021-12-22 ENCOUNTER — Ambulatory Visit
Admission: EM | Admit: 2021-12-22 | Discharge: 2021-12-22 | Disposition: A | Discharge status: Home / Self Care | Payer: BC Managed Care – PPO | Attending: Urgent Care | Admitting: Urgent Care

## 2021-12-22 ENCOUNTER — Ambulatory Visit: Payer: BC Managed Care – PPO

## 2021-12-22 DIAGNOSIS — R051 Acute cough: Secondary | ICD-10-CM | POA: Diagnosis not present

## 2021-12-22 MED ORDER — GUAIFENESIN ER 600 MG PO TB12: 600.0000 mg | ORAL_TABLET | Freq: Two times a day (BID) | ORAL | 0 refills | Status: DC

## 2021-12-22 MED ORDER — MONTELUKAST SODIUM 10 MG PO TABS: 10.0000 mg | ORAL_TABLET | Freq: Every day | ORAL | 0 refills | Status: DC

## 2021-12-22 NOTE — ED Triage Notes (Signed)
Patient presents to Urgent Care with complaints of cough since last Tuesday.Treating cough with OTC cough med no improvement.  ? ?Denies fever.  ?

## 2021-12-22 NOTE — ED Provider Notes (Signed)
?[facility] ? ? ? ?CSN: [id number] ?Arrival date & time: 12/22/21  1338 ? ? ?  ? ?History   ?Chief Complaint ?Chief Complaint  ?Patient presents with  ? Cough  ?  Entered by patient  ? ? ?HPI ?Keith Carpenter is a 66 y.o. male.  ? ?Pleasant 66yo male presents today with complaints of a cough for the past 6 days. 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 He has been taking numerous OTC medications including '400mg'$  mucinex, allergy medications and antihistamines without improvement. He denies a current or former history of smoking. He denies a fever. Endorses some mild URI sx, but primarily the cough is coughing him concern. He admits to some tightness of the chest, but denies CP, palps, SOB, DOE, wheezing. He was recently on PO prednisone x 1 week for an eczema flare. ? ? ?Cough ? ?Past Medical History:  ?Diagnosis Date  ? Collagen vascular disease ([place])   ? RA diagnosed in his 76's  ? Diabetes mellitus without complication ([place])   ? Gastric ulcer   ? H/O Guillain-Barre syndrome   ? Helicobacter pylori gastritis treated 07/2017  ? Hypertension   ? Hypocalcemia   ? Panic disorder   ? Schamberg disease   ? Thoracic aortic aneurysm ([place]) 04/2014  ? 4.4 cm followed at [facility]  ? Thyroid cancer [facility]) 2009  ? ? ?Patient Active Problem List  ? Diagnosis Date Noted  ? Erectile dysfunction 11/15/2019  ? History of adenomatous polyp of colon 04/03/2019  ? AV block, 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  ? History of MRSA infection 04/07/2018  ? Non-recurrent bilateral inguinal hernia without obstruction or gangrene 12/22/2017  ? Psoriasis (a type of skin inflammation) 12/06/2017  ? Lumbar herniated disc 11/16/2017  ? Neuropathy 01/22/2017  ? History of hypoparathyroidism 01/06/2016  ? Hypothyroidism 08/12/2015  ? Calcium deficiency disease 07/12/2015  ? Neurocardiogenic syncope 07/12/2015  ? Clinical depression 05/17/2015  ? Psoriatic arthritis ([place]) 03/12/2015  ? Thoracic ascending aortic aneurysm ([place])   ?  Essential hypertension   ? Vitamin D deficiency 07/15/2009  ? Episodic paroxysmal anxiety disorder 02/22/2008  ? RESTLESS LEGS SYNDROME 08/31/2007  ? Type 2 diabetes mellitus ([place]) 08/30/2007  ? Hyperlipemia 08/30/2007  ? History of Guillain-Barre syndrome due to influenza immunization 08/30/2007  ? Insomnia 08/17/2007  ? History of gastric ulcer 11/26/2006  ? ? ?Past Surgical History:  ?Procedure Laterality Date  ? CARDIAC CATHETERIZATION  06/07/2014  ? [place]  ? COLONOSCOPY    ? COLONOSCOPY WITH PROPOFOL N/A 03/31/2019  ? Procedure: COLONOSCOPY WITH PROPOFOL;  Surgeon: [name], MD;  Location: [facility] ENDOSCOPY;  Service: Gastroenterology;  Laterality: N/A;  ? ESOPHAGOGASTRODUODENOSCOPY (EGD) WITH PROPOFOL N/A 03/31/2019  ? Procedure: ESOPHAGOGASTRODUODENOSCOPY (EGD) WITH PROPOFOL;  Surgeon: [name], MD;  Location: [facility] ENDOSCOPY;  Service: Gastroenterology;  Laterality: N/A;  ? MENISCUS REPAIR Right   ? THYROIDECTOMY  2009  ? ? ? ? ? ?Home Medications   ? ?Prior to Admission medications   ?Medication Sig Start Date End Date Taking? Authorizing Provider  ?guaiFENesin (MUCINEX) 600 MG 12 hr tablet Take 1 tablet (600 mg total) by mouth 2 (two) times daily. 12/22/21  Yes Brennden Masten L, PA  ?montelukast (SINGULAIR) 10 MG tablet Take 1 tablet (10 mg total) by mouth at bedtime. 12/22/21  Yes Ottilie Wigglesworth L, PA  ?amLODipine (NORVASC) 5 MG tablet TAKE 1 TABLET BY MOUTH DAILY 11/20/21   Birdie Sons, MD  ?atorvastatin (LIPITOR) 10  MG tablet Take 1 tablet (10 mg total) by mouth every evening. 12/30/20 03/25/22  Birdie Sons, MD  ?clobetasol ointment (TEMOVATE) 0.05 % Apply twice daily to active areas on your elbows until smooth then stop. 07/16/20   [provider]  ?dicyclomine (BENTYL) 10 MG capsule TAKE 1 CAPSULE BY MOUTH 4 TIMES DAILY BEFORE MEALS AND AT BEDTIME 12/01/21   [name], MD  ?DULoxetine (CYMBALTA) 30 MG capsule TAKE 1 CAPSULE BY MOUTH DAILY 10/17/21   Birdie Sons, MD  ?gabapentin  (NEURONTIN) 300 MG capsule Take by mouth. 10/07/20   [provider]  ?levothyroxine (SYNTHROID) 125 MCG tablet TAKE 1 TABLET BY MOUTH DAILY 10/02/21   Birdie Sons, MD  ?lisinopril (ZESTRIL) 5 MG tablet Take 5 mg by mouth daily.    [provider]  ?methotrexate 250 MG/10ML injection Inject into the skin. 07/16/20   [provider]  ?metoprolol tartrate (LOPRESSOR) 50 MG tablet TAKE 1 TABLET BY MOUTH TWICE A DAY 02/03/21   Birdie Sons, MD  ?omeprazole (PRILOSEC) 20 MG capsule TAKE 1 CAPSULE (20 MG TOTAL) BY MOUTH DAILY 03/23/19   Birdie Sons, MD  ?tadalafil (CIALIS) 10 MG tablet Take 1 tablet (10 mg total) by mouth daily. 08/19/21   Birdie Sons, MD  ?Testosterone 20.25 MG/ACT (1.62%) GEL Place 2 Pump onto the skin daily. 12/02/21   Birdie Sons, MD  ?tiZANidine (ZANAFLEX) 2 MG tablet Take by mouth. 06/20/20   [provider]  ?triamcinolone cream (KENALOG) 0.1 %  07/22/18   [provider]  ?TUBERCULIN SYR 1CC/27GX1/2" 27G X 1/2" 1 ML MISC Inject into the skin. 03/26/20   [provider]  ?zolpidem (AMBIEN) 10 MG tablet TAKE 1 TO 2 TABLETS (10 MG TO 20 MG TOTAL) BY MOUTH AT BEDTIME AS NEEDED FOR SLEEP 09/16/21   Birdie Sons, MD  ? ? ?Family History ?Family History  ?Problem Relation Age of Onset  ? Hyperlipidemia Mother   ? Heart attack Father 76  ? Hypertension Father   ? Hyperlipidemia Father   ? Heart failure Father   ? Prostate cancer Neg Hx   ? Kidney cancer Neg Hx   ? Bladder Cancer Neg Hx   ? Kidney disease Neg Hx   ? ? ?Social History ?Social History  ? ?Tobacco Use  ? Smoking status: Never  ? Smokeless tobacco: Never  ?Vaping Use  ? Vaping Use: Never used  ?Substance Use Topics  ? Alcohol use: No  ? Drug use: No  ? ? ? ?Allergies   ?Influenza vaccines, Ixekizumab, Pregabalin, Declomycin [demeclocycline], Demeclocycline hcl, Ginger, Glipizide, Metformin and related, Other, Prednisone, Remicade [infliximab], Suvorexant, Clarithromycin, and  Pioglitazone ? ? ?Review of Systems ?Review of Systems  ?Respiratory:  Positive for cough.   ?As per hpi ? ?Physical Exam ?Triage Vital Signs ?ED Triage Vitals  ?Enc Vitals Group  ?   BP 12/22/21 1406 138/74  ?   Pulse Rate 12/22/21 1406 66  ?   Resp 12/22/21 1406 16  ?   Temp 12/22/21 1406 97.9 ?F (36.6 ?C)  ?   Temp Source 12/22/21 1406 Temporal  ?   SpO2 12/22/21 1406 98 %  ?   Weight --   ?   Height --   ?   Head Circumference --   ?   Peak Flow --   ?   Pain Score 12/22/21 1405 0  ?   Pain Loc --   ?  Pain Edu? --   ?   Excl. in [place]? --   ? ?No data found. ? ?Updated Vital Signs ?BP 138/74 (BP Location: Left Arm)   Pulse 66   Temp 97.9 ?F (36.6 ?C) (Temporal)   Resp 16   SpO2 98%  ? ?Visual Acuity ?Right Eye Distance:   ?Left Eye Distance:   ?Bilateral Distance:   ? ?Right Eye Near:   ?Left Eye Near:    ?Bilateral Near:    ? ?Physical Exam ?Vitals and nursing note reviewed.  ?Constitutional:   ?   General: He is not in acute distress. ?   Appearance: Normal appearance. He is well-developed and normal weight. He is not ill-appearing, toxic-appearing or diaphoretic.  ?HENT:  ?   Head: Normocephalic and atraumatic.  ?   Right Ear: Tympanic membrane, ear canal and external ear normal. There is no impacted cerumen.  ?   Left Ear: Tympanic membrane, ear canal and external ear normal. There is no impacted cerumen.  ?   Nose: Nose normal. No congestion or rhinorrhea.  ?   Mouth/Throat:  ?   Mouth: Mucous membranes are moist.  ?   Pharynx: Oropharynx is clear. No oropharyngeal exudate or posterior oropharyngeal erythema.  ?Eyes:  ?   General:     ?   Right eye: No discharge.     ?   Left eye: No discharge.  ?   Extraocular Movements: Extraocular movements intact.  ?   Conjunctiva/sclera: Conjunctivae normal.  ?   Pupils: Pupils are equal, round, and reactive to light.  ?Cardiovascular:  ?   Rate and Rhythm: Normal rate and regular rhythm.  ?   Pulses: Normal pulses.  ?   Heart sounds: Normal heart sounds. No murmur  heard. ?Pulmonary:  ?   Effort: Pulmonary effort is normal. No respiratory distress.  ?   Breath sounds: No stridor. Rhonchi present. No wheezing or rales.  ?   Comments: Coarse breath sounds to bilateral apices ?Inc

## 2021-12-22 NOTE — Discharge Instructions (Addendum)
You were prescribed montelukast and mucinex today. ?Please take the mucinex twice daily with plenty of water. ?You will take the montelukast once nightly. ?A chest xray was recommended due to adventitious breath sounds. You have opted not to proceed with this test. ?If you continue to have a cough, tightness in the chest, or if you develop a fever, please return to the clinic or the ER for a further workup. ?In the meantime, you can try other supportive remedies including humidification/ steam from a hot shower, eucalyptus, or honey. ?

## 2021-12-24 ENCOUNTER — Ambulatory Visit: Payer: Self-pay | Admitting: *Deleted

## 2021-12-24 NOTE — Telephone Encounter (Signed)
?  Chief Complaint: cough worsening and shortness of breath at rest  ?Symptoms: cough productive at times with thick "off white" colored sputum, breathing is "tight". Shortness of breath at rest . Last seen in UC 12/22/21 and told to contact PCP for worsening sx ?Frequency: since 12/22/21 ?Pertinent Negatives: Patient denies chest pain , difficulty breathing or issues talking.  ?Disposition: '[x]'$ ED /'[x]'$ Urgent Care (no appt availability in office) / '[]'$ Appointment(In office/virtual)/ '[]'$  [facility]/ '[]'$ Home Care/ '[x]'$ Refused Recommended Disposition /'[]'$ [facility] [place] [facility]/ '[]'$  Follow-up with PCP ?Additional Notes:  ? ?Patient reports he wants to see a provider at practice due to already being seen at [facility] 12/[facility]. Hx heart issues and would like a call back regarding appt with a provider.  Wants to be seen in person not UC virtual visit if possible.  Please advise.  ? ? ? ?Reason for Disposition ? [1] MILD difficulty breathing (e.g., minimal/no SOB at rest, SOB with walking, pulse <100) AND [2] NEW-onset or WORSE than normal ? ?Answer Assessment - Initial Assessment Questions ?1. RESPIRATORY STATUS: "Describe your breathing?" (e.g., wheezing, shortness of breath, unable to speak, severe coughing)  ?    Shortness of breath at rest . Coughing with thick "off white" mucus  ?2. ONSET: "When did this breathing problem begin?"  ?    Last Tuesday  ?3. PATTERN "Does the difficult breathing come and go, or has it been constant since it started?"  ?    constant ?4. SEVERITY: "How bad is your breathing?" (e.g., mild, moderate, severe)  ?  - MILD: No SOB at rest, mild SOB with walking, speaks normally in sentences, can lie down, no retractions, pulse < 100.  ?  - MODERATE: SOB at rest, SOB with minimal exertion and prefers to sit, cannot lie down flat, speaks in phrases, mild retractions, audible wheezing, pulse 100-120.  ?  - SEVERE: Very SOB at rest, speaks in single words, struggling to breathe, sitting hunched forward,  retractions, pulse > 120  ?    Moderate  ?5. RECURRENT SYMPTOM: "Have you had difficulty breathing before?" If Yes, ask: "When was the last time?" and "What happened that time?"  ?    Yes but feels worse ?6. CARDIAC HISTORY: "Do you have any history of heart disease?" (e.g., heart attack, angina, bypass surgery, angioplasty)  ?    Hx heart issues, aneurysm ?7. LUNG HISTORY: "Do you have any history of lung disease?"  (e.g., pulmonary embolus, asthma, emphysema) ?    Na  ?8. CAUSE: "What do you think is causing the breathing problem?"  ?    Pneumonia  ?9. OTHER SYMPTOMS: "Do you have any other symptoms? (e.g., dizziness, runny nose, cough, chest pain, fever) ?    Cough, shortness of breath at rest  s/p pneumonia  ?10. O2 SATURATION MONITOR:  "Do you use an oxygen saturation monitor (pulse oximeter) at home?" If Yes, "What is your reading (oxygen level) today?" "What is your usual oxygen saturation reading?" (e.g., 95%) ?      na ?11. PREGNANCY: "Is there any chance you are pregnant?" "When was your last menstrual period?" ?      na ?12. TRAVEL: "Have you traveled out of the country in the last month?" (e.g., travel history, exposures) ?      na ? ?Protocols used: Breathing Difficulty-A-AH ? ?

## 2021-12-24 NOTE — Telephone Encounter (Signed)
Patient advised, he declined ED or UC he states that he only wants to be seen in office, I advised patient there was no availability until tomorrow afternoon he stated that it was fine. I strongly encouraged patient to seek immediate medical attention if things worsen over night. KW ?

## 2021-12-25 ENCOUNTER — Encounter: Payer: Self-pay | Admitting: Physician Assistant

## 2021-12-25 ENCOUNTER — Ambulatory Visit: Payer: BC Managed Care – PPO | Admitting: Physician Assistant

## 2021-12-25 ENCOUNTER — Telehealth: Payer: Self-pay | Admitting: Family Medicine

## 2021-12-25 VITALS — BP 140/80 | HR 66 | Temp 98.6°F | Resp 16 | Wt 204.0 lb

## 2021-12-25 DIAGNOSIS — J069 Acute upper respiratory infection, unspecified: Secondary | ICD-10-CM

## 2021-12-25 DIAGNOSIS — R059 Cough, unspecified: Secondary | ICD-10-CM | POA: Diagnosis not present

## 2021-12-25 MED ORDER — AMOXICILLIN-POT CLAVULANATE 875-125 MG PO TABS: 1.0000 | ORAL_TABLET | Freq: Two times a day (BID) | ORAL | 0 refills | Status: DC

## 2021-12-25 NOTE — Progress Notes (Signed)
?  ? ?I,Elleigh Cassetta Robinson,acting as a Education administrator for [employer], PA-C.,have documented all relevant documentation on the behalf of Mardene Speak, PA-C,as directed by  [employer], PA-C while in the presence of [employer], PA-C. ? ? ?Established patient visit ? ? ?Patient: Keith Carpenter   DOB: [birth year]   66 y.o. Male  MRN: [id number] ?Visit Date: 12/25/2021 ? ?Today's healthcare provider: Mardene Speak, PA-C  ? ?Chief Complaint  ?Patient presents with  ?? Cough  ?? Shortness of Breath  ? ?Upper respiratory symptoms ? ?Subjective  ?Patient presents today for cough/worsening and SOB at rest since last Tuesday/ 8 days with coughing every few minutes.  Productive at times with thick off white colored sputum, breathing feels tight.  Patient was seen at [facility] on 12-22-21 where he advised to get CXR but patient refused. ?Patient states UC provider believed patient may have pneumonia / treatment included OTC mucinex 600 mg twice daily, allergy medications and antihistamines without improvement. 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   ?Patient reports he is drinking plenty of fluids ? ? ?Weight trend ?Wt Readings from Last 3 Encounters:  ?12/25/21 204 lb (92.5 kg)  ?11/24/21 202 lb (91.6 kg)  ?08/19/21 213 lb (96.6 kg)  ? ? ?Lipid Panel  ?   ?Component Value Date/Time  ? CHOL 140 12/27/2020 1625  ? CHOL 220 (H) 02/23/2012 0416  ? TRIG 74 12/27/2020 1625  ? TRIG 104 02/23/2012 0416  ? HDL 38 (L) 12/27/2020 1625  ? HDL 36 (L) 02/23/2012 0416  ? CHOLHDL 3.7 12/27/2020 1625  ? VLDL 21 02/23/2012 0416  ? [place] 87 12/27/2020 1625  ? [place] 163 (H) 02/23/2012 0416  ? ? ?Last metabolic panel ?Lab Results  ?Component Value Date  ? GLUCOSE 85 08/19/2021  ? NA 139 08/19/2021  ? K 4.4 08/19/2021  ? CL 101 08/19/2021  ? CO2 24 08/19/2021  ? BUN 13 08/19/2021  ? CREATININE 0.91 08/19/2021  ? GFRNONAA 78 11/15/2019  ? GFRAA 90 11/15/2019  ? CALCIUM 8.5 (L) 08/19/2021  ? PROT 6.9 08/19/2021  ? ALBUMIN 4.4 08/19/2021   ? LABGLOB 2.5 08/19/2021  ? AGRATIO 1.8 08/19/2021  ? BILITOT 0.4 08/19/2021  ? ALKPHOS 78 08/19/2021  ? AST 20 08/19/2021  ? ALT 23 08/19/2021  ? ANIONGAP 12 08/25/2017  ? ? ?-----------------------------------------------------------------------------------------  ?  ?  ?Medications: ?Outpatient Medications Prior to Visit  ?Medication Sig  ?? amLODipine (NORVASC) 5 MG tablet TAKE 1 TABLET BY MOUTH DAILY  ?? atorvastatin (LIPITOR) 10 MG tablet Take 1 tablet (10 mg total) by mouth every evening.  ?? clobetasol ointment (TEMOVATE) 0.05 % Apply twice daily to active areas on your elbows until smooth then stop.  ?? dicyclomine (BENTYL) 10 MG capsule TAKE 1 CAPSULE BY MOUTH 4 TIMES DAILY BEFORE MEALS AND AT BEDTIME  ?? DULoxetine (CYMBALTA) 30 MG capsule TAKE 1 CAPSULE BY MOUTH DAILY  ?? gabapentin (NEURONTIN) 300 MG capsule Take by mouth.  ?? guaiFENesin (MUCINEX) 600 MG 12 hr tablet Take 1 tablet (600 mg total) by mouth 2 (two) times daily.  ?? levothyroxine (SYNTHROID) 125 MCG tablet TAKE 1 TABLET BY MOUTH DAILY  ?? lisinopril (ZESTRIL) 5 MG tablet Take 5 mg by mouth daily.  ?? methotrexate 250 MG/10ML injection Inject into the skin.  ?? metoprolol tartrate (LOPRESSOR) 50 MG tablet TAKE 1 TABLET BY MOUTH TWICE A DAY  ?? montelukast (SINGULAIR) 10 MG tablet Take 1 tablet (10 mg total) by mouth at bedtime.  ??  omeprazole (PRILOSEC) 20 MG capsule TAKE 1 CAPSULE (20 MG TOTAL) BY MOUTH DAILY  ?? tadalafil (CIALIS) 10 MG tablet Take 1 tablet (10 mg total) by mouth daily.  ?? Testosterone 20.25 MG/ACT (1.62%) GEL Place 2 Pump onto the skin daily.  ?? tiZANidine (ZANAFLEX) 2 MG tablet Take by mouth.  ?? triamcinolone cream (KENALOG) 0.1 %   ?? TUBERCULIN SYR 1CC/27GX1/2" 27G X 1/2" 1 ML MISC Inject into the skin.  ?? zolpidem (AMBIEN) 10 MG tablet TAKE 1 TO 2 TABLETS (10 MG TO 20 MG TOTAL) BY MOUTH AT BEDTIME AS NEEDED FOR SLEEP  ? ?No facility-administered medications prior to visit.  ? ? ?Review of Systems  ?Respiratory:   Positive for cough, chest tightness and shortness of breath.   ?All other systems reviewed and are negative. ? ?  Objective  ?  ?BP 140/80 (BP Location: Left Arm, Patient Position: Sitting, Cuff Size: Normal)   Pulse 66   Temp 98.6 ?F (37 ?C) (Oral)   Resp 16   Wt 204 lb (92.5 kg)   SpO2 98%   BMI 28.45 kg/m?  ? ?Physical Exam ?Vitals and nursing note reviewed.  ?Constitutional:   ?   Appearance: He is well-developed. He is ill-appearing.  ?HENT:  ?   Head: Normocephalic and atraumatic.  ?Neck:  ?   Thyroid: No thyromegaly.  ?   Vascular: No JVD.  ?Cardiovascular:  ?   Rate and Rhythm: Normal rate and regular rhythm.  ?Pulmonary:  ?   Breath sounds: Rhonchi present. No decreased breath sounds.  ?Chest:  ?   Chest wall: No tenderness or edema.  ?Musculoskeletal:  ?   Cervical back: Normal range of motion.  ?   Right lower leg: No edema.  ?   Left lower leg: No edema.  ?Lymphadenopathy:  ?   Cervical: No cervical adenopathy.  ?Neurological:  ?   Mental Status: He is alert and oriented to person, place, and time.  ?  ? ? ?No results found for any visits on 12/25/21. ? Assessment & Plan  ?  ? ?1. COUGH ?Suspected 2/2 pneumonia, URI,  ?Abnormal lung exam, currently on methotrexate ?- amoxicillin-clavulanate (AUGMENTIN) 875-125 MG tablet; Take 1 tablet by mouth 2 (two) times daily.  Dispense: 20 tablet; Refill: 0  ?Advised to take it for 5 days - 10 days depending on response. ?Patient prefers to take medication for 5 days ?Patient prefers to take a simple antibiotic/medication and avoid multidrug therapy ?- CXR-patient refused to proceed with imaging at this visit ? ?FU as needed ?   ?The patient was advised to call back or seek an in-person evaluation if the symptoms worsen or if the condition fails to improve as anticipated. ? ?I discussed the assessment and treatment plan with the patient . Patient was seen and advised by Dr. Rosanna Randy., per patient request. The patient was provided an opportunity to ask questions  and all were answered. The patient agreed with the plan and demonstrated an understanding of the instructions. ? ?The entirety of the information documented in the History of Present Illness, Review of Systems and Physical Exam were personally obtained by me. Portions of this information were initially documented by the CMA and reviewed by me for thoroughness and accuracy.   ? ? Total encounter time more than 30 minutes ? Greater than 50% was spent in counseling and coordination of care with the patient  ? ?Mardene Speak, PA-C  ?[place] ?[phone] (phone) ?[phone] (fax) ? ?[facility] ?

## 2021-12-25 NOTE — Telephone Encounter (Signed)
Pt called back to report that the wrong Rx was called in today. He says there was a lot of confusion on medications today during appt. Pt reports that he was specifically instructed NOT to take exactly what was called in. Please advise, says he was instructed to take just amoxicillin.  ? ?[place], [place] [place] [address]  ?Phone: [phone] Fax: [phone]  ? ?

## 2021-12-26 ENCOUNTER — Other Ambulatory Visit: Payer: Self-pay | Admitting: Physician Assistant

## 2021-12-26 MED ORDER — AMOXICILLIN 500 MG PO TABS: ORAL_TABLET | ORAL | 0 refills | Status: DC

## 2021-12-26 NOTE — Telephone Encounter (Signed)
Please advise 

## 2021-12-26 NOTE — Telephone Encounter (Signed)
Patient informed and states he was under the impression he was to be prescribed amoxicillin from [place] and Dr. Rosanna Randy by the end of the visit.  He is requesting amoxicillin be sent to pharmacy. Per [place] verbal Rx sent  and patient advised on directions and to call office at completion of treatment if not better. Patient voices understanding.   ?

## 2021-12-26 NOTE — Telephone Encounter (Signed)
Per Janna's verbal to clarify ... pt informed he is to take two 500 mg tabs 3 times daily.  Patient agreeable to this and voices understanding.  ?

## 2022-03-05 ENCOUNTER — Other Ambulatory Visit: Payer: Self-pay | Admitting: Family Medicine

## 2022-03-05 DIAGNOSIS — I10 Essential (primary) hypertension: Secondary | ICD-10-CM

## 2022-03-11 ENCOUNTER — Other Ambulatory Visit: Payer: Self-pay

## 2022-03-12 ENCOUNTER — Encounter: Payer: Self-pay | Admitting: Gastroenterology

## 2022-03-12 ENCOUNTER — Ambulatory Visit (INDEPENDENT_AMBULATORY_CARE_PROVIDER_SITE_OTHER): Payer: BC Managed Care – PPO | Admitting: Gastroenterology

## 2022-03-12 VITALS — BP 124/76 | HR 57 | Temp 98.8°F | Ht 71.0 in | Wt 214.8 lb

## 2022-03-12 DIAGNOSIS — R1084 Generalized abdominal pain: Secondary | ICD-10-CM

## 2022-03-12 DIAGNOSIS — Z8719 Personal history of other diseases of the digestive system: Secondary | ICD-10-CM | POA: Diagnosis not present

## 2022-03-12 NOTE — Progress Notes (Signed)
[name] MD, MRCP(U.K) [address]  [place]  [place], [place] [address]  Main: [phone]  Fax: [phone]   Primary Care Physician: Birdie Sons, MD  Primary Gastroenterologist:  Dr. [name]   Chief Complaint  Patient presents with   Abdominal Pain    HPI: Keith Carpenter is a 66 y.o. male Summary of history :   He is here today to follow-up for his abdominal pain.    He was initially seen by myself in January 2019 for abdominal pain.  Treated for H. pylori antibody back in 08/03/2017.  Eradication confirmed.  He developed diverticulitis of the descending colon in December 2018.  Was treated with 2 courses of antibiotics.  In January 2019 he also had C. difficile diarrhea.  He was referred for his inguinal hernias to surgery.  At that point of time he mentioned to Dr. Bary Castilla that he had abdominal pain.  He underwent right upper quadrant ultrasound in December 2019 which was normal.  Subsequently was followed up by a HIDA scan.  HIDA scan was within normal limits.     Did not go through surgery for hernia.    10/19/2017: CT scan of the abdomen: Resolution of diverticulitis.  Bilateral inguinal hernias containing fat with stranding identified within the left inguinal sac.  Mild prostatic enlargement involving the central lobe.  03/09/2019: Stool for C. difficile and GI PCR negative.   03/31/2019: EGD normal appearance.  Biopsies of the duodenum showed focal mild active inflammation.  Biopsies of the stomach showed nonspecific gastritis and negative for H. pylori.  Colonoscopy showed 3 polyps which were sessile serrated adenomas and a tubular adenoma.  Dysplasia noted.  Terminal ileum biopsies were negative for inflammation.  Random colon biopsies were negative for colitis.      Interval history 07/20/2019-03/12/2022   Doing well since last visit.  The Bentyl or dicyclomine works very well for his abdominal cramping which he takes as needed.  He has learned  to change his diet if he has pain in his belly and reduces the amount of fiber which reduces the amount of gas and bloating.  No other complaints at this point of time doing well.  He has not obtained any surgery for his inguinal hernia would like to wait and watch.  Current Outpatient Medications  Medication Sig Dispense Refill   amLODipine (NORVASC) 5 MG tablet TAKE 1 TABLET BY MOUTH DAILY 90 tablet 4   atorvastatin (LIPITOR) 10 MG tablet Take 1 tablet (10 mg total) by mouth every evening. 90 tablet 4   clobetasol ointment (TEMOVATE) 0.05 % Apply twice daily to active areas on your elbows until smooth then stop.     dicyclomine (BENTYL) 10 MG capsule TAKE 1 CAPSULE BY MOUTH 4 TIMES DAILY BEFORE MEALS AND AT BEDTIME 120 capsule 5   DULoxetine (CYMBALTA) 30 MG capsule TAKE 1 CAPSULE BY MOUTH DAILY 30 capsule 12   folic acid (FOLVITE) 1 MG tablet Take 1 mg by mouth 2 (two) times daily.     gabapentin (NEURONTIN) 300 MG capsule Take 1 capsule by mouth 2 (two) times daily.     levothyroxine (SYNTHROID) 125 MCG tablet TAKE 1 TABLET BY MOUTH DAILY 90 tablet 4   lisinopril (ZESTRIL) 5 MG tablet Take 5 mg by mouth daily.     methotrexate 250 MG/10ML injection Inject into the skin.     metoprolol tartrate (LOPRESSOR) 50 MG tablet TAKE 1 TABLET BY MOUTH TWICE A DAY 60  tablet 11   tacrolimus (PROTOPIC) 0.1 % ointment Apply topically.     tadalafil (CIALIS) 10 MG tablet Take 1 tablet (10 mg total) by mouth daily. 30 tablet 2   Testosterone 20.25 MG/ACT (1.62%) GEL Place 2 Pump onto the skin daily. 75 g 1   triamcinolone cream (KENALOG) 0.1 %   2   TUBERCULIN SYR 1CC/27GX1/2" 27G X 1/2" 1 ML MISC Inject into the skin.     zolpidem (AMBIEN) 10 MG tablet TAKE 1 TO 2 TABLETS (10 MG TO 20 MG TOTAL) BY MOUTH AT BEDTIME AS NEEDED FOR SLEEP 60 tablet 5   No current facility-administered medications for this visit.    Allergies as of 03/12/2022 - Review Complete 03/12/2022  Allergen Reaction Noted   Influenza  vaccines Other (See Comments) 09/27/2015   Ixekizumab Hives 10/14/2018   Pregabalin Itching, Rash, and Swelling 03/24/2021   Declomycin [demeclocycline]  06/04/2014   Demeclocycline hcl     Glipizide  01/04/2017   Metformin and related Nausea Only 08/18/2016   Other     Prednisone  09/09/2019   Remicade [infliximab]     Suvorexant Other (See Comments) 07/12/2015   Clarithromycin Rash 08/06/2017   Pioglitazone Itching, Swelling, and Rash 01/14/2017    ROS:  General: Negative for anorexia, weight loss, fever, chills, fatigue, weakness. ENT: Negative for hoarseness, difficulty swallowing , nasal congestion. CV: Negative for chest pain, angina, palpitations, dyspnea on exertion, peripheral edema.  Respiratory: Negative for dyspnea at rest, dyspnea on exertion, cough, sputum, wheezing.  GI: See history of present illness. GU:  Negative for dysuria, hematuria, urinary incontinence, urinary frequency, nocturnal urination.  Endo: Negative for unusual weight change.    Physical Examination:   BP 124/76   Pulse (!) 57   Temp 98.8 F (37.1 C) (Oral)   Ht '5\' 11"'$  (1.803 m)   Wt 214 lb 12.8 oz (97.4 kg)   BMI 29.96 kg/m   General: Well-nourished, well-developed in no acute distress.  Eyes: No icterus. Conjunctivae pink. Mouth: Oropharyngeal mucosa moist and pink , no lesions erythema or exudate. Extremities: No lower extremity edema. No clubbing or deformities. Neuro: Alert and oriented x 3.  Grossly intact. Skin: Warm and dry, no jaundice.   Psych: Alert and cooperative, normal mood and affect.   Imaging Studies: No results found.  Assessment and Plan:   Keith Carpenter is a 66 y.o. y/o male  here to follow-up for abdominal pain.  Prior history of diverticulitis requiring antibiotics treatment subsequently obtained C. difficile diarrhea which had to be treated.  Doing well since then for his abdominal pain with dicyclomine.  No new complaints.  Dr [name]  MD,MRCP  [facility]) Follow up in as needed

## 2022-03-31 ENCOUNTER — Other Ambulatory Visit: Payer: Self-pay

## 2022-03-31 ENCOUNTER — Telehealth: Payer: Self-pay

## 2022-03-31 DIAGNOSIS — Z111 Encounter for screening for respiratory tuberculosis: Secondary | ICD-10-CM

## 2022-03-31 NOTE — Telephone Encounter (Signed)
Spoke with patient. Test is ordered.

## 2022-03-31 NOTE — Telephone Encounter (Signed)
Pt called and stated that he needed a TB test. He is unable to do the skin test due to hx of Guillian-Barre syndrome. He would like to do the quantiferon TB- Gold blood test instead. Please advise, best contact [phone].

## 2022-04-03 LAB — QUANTIFERON-TB GOLD PLUS
QuantiFERON Mitogen Value: >10 IU/mL
QuantiFERON Nil Value: 0.01 IU/mL
QuantiFERON TB1 Ag Value: 0.02 IU/mL
QuantiFERON TB2 Ag Value: 0.01 IU/mL
QuantiFERON-TB Gold Plus: NEGATIVE

## 2022-04-03 NOTE — Progress Notes (Signed)
Negative TB results via blood test.  Gwyneth Sprout, [place] [address] [place], [place] [address] [phone] (phone) [phone] (fax) [place]

## 2022-04-09 ENCOUNTER — Other Ambulatory Visit: Payer: Self-pay | Admitting: Family Medicine

## 2022-04-09 DIAGNOSIS — G4700 Insomnia, unspecified: Secondary | ICD-10-CM

## 2022-05-13 ENCOUNTER — Telehealth: Payer: BC Managed Care – PPO | Admitting: Family

## 2022-05-13 DIAGNOSIS — I10 Essential (primary) hypertension: Secondary | ICD-10-CM

## 2022-05-13 DIAGNOSIS — U071 COVID-19: Secondary | ICD-10-CM

## 2022-05-13 DIAGNOSIS — E785 Hyperlipidemia, unspecified: Secondary | ICD-10-CM | POA: Diagnosis not present

## 2022-05-13 DIAGNOSIS — E119 Type 2 diabetes mellitus without complications: Secondary | ICD-10-CM

## 2022-05-13 MED ORDER — MOLNUPIRAVIR EUA 200MG CAPSULE: 4.0000 | ORAL_CAPSULE | Freq: Two times a day (BID) | ORAL | 0 refills | Status: AC

## 2022-05-13 MED ORDER — MOLNUPIRAVIR EUA 200MG CAPSULE: 4.0000 | ORAL_CAPSULE | Freq: Two times a day (BID) | ORAL | 0 refills | Status: DC

## 2022-05-13 NOTE — Progress Notes (Signed)
Virtual Visit Consent   Kary Colaizzi, you are scheduled for a virtual visit with a [facility] provider today. Just as with appointments in the office, your consent must be obtained to participate. Your consent will be active for this visit and any virtual visit you may have with one of our providers in the next 365 days. If you have a MyChart account, a copy of this consent can be sent to you electronically.  As this is a virtual visit, video technology does not allow for your provider to perform a traditional examination. This may limit your provider's ability to fully assess your condition. If your provider identifies any concerns that need to be evaluated in person or the need to arrange testing (such as labs, EKG, etc.), we will make arrangements to do so. Although advances in technology are sophisticated, we cannot ensure that it will always work on either your end or our end. If the connection with a video visit is poor, the visit may have to be switched to a telephone visit. With either a video or telephone visit, we are not always able to ensure that we have a secure connection.  By engaging in this virtual visit, you consent to the provision of healthcare and authorize for your insurance to be billed (if applicable) for the services provided during this visit. Depending on your insurance coverage, you may receive a charge related to this service.  I need to obtain your verbal consent now. Are you willing to proceed with your visit today? Keith Carpenter has provided verbal consent on 05/13/2022 for a virtual visit (video or telephone). Evelina Dun, FNP  Date: 05/13/2022 9:03 AM  Virtual Visit via Video Note   I, Evelina Dun, connected with  Keith Carpenter  ([id number], November 27, 1955) on 05/13/22 at  9:00 AM EDT by a video-enabled telemedicine application and verified that I am speaking with the correct person using two identifiers.  Location: Patient: Virtual Visit  Location Patient: Home Provider: Virtual Visit Location Provider: Home Office   I discussed the limitations of evaluation and management by telemedicine and the availability of in person appointments. The patient expressed understanding and agreed to proceed.    History of Present Illness: Keith Carpenter is a 66 y.o. who identifies as a male who was assigned male at birth, and is being seen today for COVID.He reports his symptoms started two days ago and took a COVID test this morning.   He has DM, HTN, and hyperlipidemia. These are stable.   HPI: URI  This is a new problem. The current episode started in the past 7 days. The maximum temperature recorded prior to his arrival was 100.4 - 100.9 F. Associated symptoms include congestion, coughing, diarrhea, ear pain, headaches, nausea, rhinorrhea, sinus pain, sneezing, a sore throat and wheezing. Pertinent negatives include no vomiting. He has tried acetaminophen and decongestant for the symptoms. The treatment provided mild relief.    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   History of MRSA infection 04/07/2018   Non-recurrent bilateral inguinal hernia without obstruction or gangrene 12/22/2017   Psoriasis (a type of skin inflammation) 12/06/2017   Chronic use of opiate drug for therapeutic purpose 12/06/2017   Lumbar herniated disc 11/16/2017   Neuropathy 01/22/2017   History of hypoparathyroidism 01/06/2016   Hypothyroidism 08/12/2015   Calcium deficiency disease  07/12/2015   Neurocardiogenic syncope 07/12/2015   Clinical depression 05/17/2015   Psoriatic arthritis ([place]) 03/12/2015   Thoracic ascending aortic aneurysm [facility])    Essential hypertension    Vitamin D deficiency 07/15/2009   Episodic paroxysmal anxiety disorder 02/22/2008   RESTLESS LEGS SYNDROME 08/31/2007   Type 2  diabetes mellitus ([place]) 08/30/2007   Hyperlipemia 08/30/2007   History of Guillain-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 Allergen: Other Allergy: See Patient Chart for Details, Other Reaction: Other reaction   Prednisone     Patient was told not to take for extended periods of time due to personal history of aortic aneurysm Other reaction(s): Other (See Comments) Personal history of aortic aneurysm Personal history of aortic aneurysm   Remicade [Infliximab]    Suvorexant Other (See Comments)    vision changes   Clarithromycin Rash   Pioglitazone Itching, Swelling and Rash   Medications:  Current Outpatient Medications:    amLODipine (NORVASC) 5 MG tablet, TAKE 1 TABLET BY MOUTH DAILY, Disp: 90 tablet, Rfl: 4   atorvastatin (LIPITOR) 10 MG tablet, Take 1 tablet (10 mg total) by mouth every evening., Disp: 90 tablet, Rfl: 4   clobetasol ointment (TEMOVATE) 0.05 %, Apply twice daily to active areas on your elbows until smooth then stop., Disp: , Rfl:    dicyclomine (BENTYL) 10 MG capsule, TAKE 1 CAPSULE BY MOUTH 4 TIMES DAILY BEFORE MEALS AND AT BEDTIME, Disp: 120 capsule, Rfl: 5   DULoxetine (CYMBALTA) 30 MG capsule, TAKE 1 CAPSULE BY MOUTH DAILY, Disp: 30 capsule, Rfl: 12   folic acid (FOLVITE) 1 MG tablet, Take 1 mg by mouth 2 (two) times daily., Disp: , Rfl:    gabapentin (NEURONTIN) 300 MG capsule, Take 1 capsule by mouth 2 (two) times daily., Disp: , Rfl:    levothyroxine (SYNTHROID)  125 MCG tablet, TAKE 1 TABLET BY MOUTH DAILY, Disp: 90 tablet, Rfl: 4   lisinopril (ZESTRIL) 5 MG tablet, Take 5 mg by mouth daily., Disp: , Rfl:    methotrexate 250 MG/10ML injection, Inject into the skin., Disp: , Rfl:    metoprolol tartrate (LOPRESSOR) 50 MG tablet, TAKE 1 TABLET BY MOUTH TWICE A DAY, Disp: 60 tablet, Rfl: 11   tacrolimus (PROTOPIC) 0.1 % ointment, Apply topically., Disp: , Rfl:    tadalafil (CIALIS) 10 MG tablet, Take 1 tablet (10 mg total) by mouth daily., Disp: 30 tablet, Rfl: 2   Testosterone 20.25 MG/ACT (1.62%) GEL, Place 2 Pump onto the skin daily., Disp: 75 g, Rfl: 1   triamcinolone cream (KENALOG) 0.1 %, , Disp: , Rfl: 2   TUBERCULIN SYR 1CC/27GX1/2" 27G X 1/2" 1 ML MISC, Inject into the skin., Disp: , Rfl:    zolpidem (AMBIEN) 10 MG tablet, TAKE 1 TO 2 TABLETS BY MOUTH AT BEDTIME AS NEEDED FOR SLEEP, Disp: 60 tablet, Rfl: 5  Observations/Objective: Patient is well-developed, well-nourished in no acute distress.  Resting  comfortably  at home.  Head is normocephalic, atraumatic.  No labored breathing.  Speech is clear and coherent with logical content.  Patient is alert and oriented at baseline.  Nasal congstion  Assessment and Plan: 1. Telehealth encounter for confirmed COVID-19  2. Type 2 diabetes mellitus without complication, without long-term current use of insulin ([place])  3. Hyperlipidemia, unspecified hyperlipidemia type  4. Essential hypertension  COVID positive, rest, force fluids, tylenol as needed, Quarantine for at least 5 days and you are fever free, then must wear a mask out in public from day 3-42, report any worsening symptoms such as increased shortness of breath, swelling, or continued high fevers. Possible adverse effects discussed with antivirals.    Follow Up Instructions: I discussed the assessment and treatment plan with the patient. The patient was provided an opportunity to ask questions and all were answered. The patient agreed  with the plan and demonstrated an understanding of the instructions.  A copy of instructions were sent to the patient via MyChart unless otherwise noted below.     The patient was advised to call back or seek an in-person evaluation if the symptoms worsen or if the condition fails to improve as anticipated.  Time:  I spent 13 minutes with the patient via telehealth technology discussing the above problems/concerns.    Evelina Dun, FNP

## 2022-05-13 NOTE — Addendum Note (Signed)
Addended by: Evelina Dun A on: 05/13/2022 09:55 AM   Modules accepted: Orders

## 2022-06-25 ENCOUNTER — Other Ambulatory Visit: Payer: Self-pay | Admitting: Family Medicine

## 2022-06-25 DIAGNOSIS — E785 Hyperlipidemia, unspecified: Secondary | ICD-10-CM

## 2022-07-23 ENCOUNTER — Other Ambulatory Visit: Payer: Self-pay | Admitting: Family Medicine

## 2022-07-23 DIAGNOSIS — N529 Male erectile dysfunction, unspecified: Secondary | ICD-10-CM

## 2022-07-23 NOTE — Telephone Encounter (Signed)
Requested Prescriptions  Pending Prescriptions Disp Refills   tadalafil (CIALIS) 10 MG tablet [Pharmacy Med Name: TADALAFIL 10 MG TAB] 30 tablet 2    Sig: TAKE 1 TABLET BY MOUTH DAILY     Urology: Erectile Dysfunction Agents Passed - 07/23/2022  2:44 PM      Passed - AST in normal range and within 360 days    AST  Date Value Ref Range Status  08/19/2021 20 0 - 40 IU/L Final         Passed - ALT in normal range and within 360 days    ALT  Date Value Ref Range Status  08/19/2021 23 0 - 44 IU/L Final         Passed - Last BP in normal range    BP Readings from Last 1 Encounters:  03/12/22 124/76         Passed - Valid encounter within last 12 months    Recent Outpatient Visits           7 months ago URI, acute   [employer], [place], PA-C   8 months ago Type 2 diabetes mellitus without complication, without long-term current use of insulin ([place])   [facility] Birdie Sons, MD   11 months ago [place] and fatigue   [facility] Birdie Sons, MD   1 year ago Type 2 diabetes mellitus without complication, without long-term current use of insulin [facility])   [facility] Birdie Sons, MD   1 year ago Type 2 diabetes mellitus without complication, without long-term current use of insulin [facility])   [facility] C[facility], Kirstie Peri, MD

## 2022-09-09 ENCOUNTER — Ambulatory Visit: Admit: 2022-09-09 | Discharge status: Against Medical Advice | Payer: BC Managed Care – PPO

## 2022-09-09 ENCOUNTER — Ambulatory Visit: Payer: Self-pay | Admitting: *Deleted

## 2022-09-09 NOTE — Telephone Encounter (Signed)
  Chief Complaint: Abdominal pain Symptoms: Severe right sided, radiates to left the lower abdomen. Saturday had vomiting and fever, not presently. States pain 8/10 with occasional "Punch in the gut."  Diarrhea since Saturday Frequency: Saturday Pertinent Negatives: Patient denies fever, vomiting Disposition: '[x]'$ ED /'[]'$ Urgent Care (no appt availability in office) / '[]'$ Appointment(In office/virtual)/ '[]'$  [place] Virtual [facility]/ '[]'$ Home Care/ '[]'$ Refused Recommended Disposition /'[]'$ [facility]/ '[]'$  Follow-up with PCP Additional Notes: Advised ED, declines. Called practice, transferred to clinical, pt call transferred. Reason for Disposition  [1] SEVERE pain (e.g., excruciating) AND [2] present > 1 hour  Answer Assessment - Initial Assessment Questions 1. LOCATION: "Where does it hurt?"      Right center to left side, radiates down from left side 2. RADIATION: "Does the pain shoot anywhere else?" (e.g., chest, back)     No 3. ONSET: "When did the pain begin?" (Minutes, hours or days ago)      Saturday, worsening, vomited, fever, dizzy 4. SUDDEN: "Gradual or sudden onset?"     Gradually. 5. PATTERN "Does the pain come and go, or is it constant?"    - If it comes and goes: "How long does it last?" "Do you have pain now?"     (Note: Comes and goes means the pain is intermittent. It goes away completely between bouts.)    - If constant: "Is it getting better, staying the same, or getting worse?"      (Note: Constant means the pain never goes away completely; most serious pain is constant and gets worse.)      Comes and goes, more painful laying down right side 6. SEVERITY: "How bad is the pain?"  (e.g., Scale 1-10; mild, moderate, or severe)    - MILD (1-3): Doesn't interfere with normal activities, abdomen soft and not tender to touch.     - MODERATE (4-7): Interferes with normal activities or awakens from sleep, abdomen tender to touch.     - SEVERE (8-10): Excruciating pain, doubled over,  unable to do any normal activities.       8/10 mostly. Every few seconds like a punch 7. RECURRENT SYMPTOM: "Have you ever had this type of stomach pain before?" If Yes, ask: "When was the last time?" and "What happened that time?"       8. CAUSE: "What do you think is causing the stomach pain?"     Diverticulitis.  9. RELIEVING/AGGRAVATING FACTORS: "What makes it better or worse?" (e.g., antacids, bending or twisting motion, bowel movement)      10. OTHER SYMPTOMS: "Do you have any other symptoms?" (e.g., back pain, diarrhea, fever, urination pain, vomiting)       Diarrhea since Saturday.  Protocols used: Abdominal Pain - Male-A-AH

## 2022-09-09 NOTE — Telephone Encounter (Signed)
Needs to go to ED

## 2022-09-09 NOTE — Telephone Encounter (Signed)
Patient advised.

## 2022-10-29 ENCOUNTER — Other Ambulatory Visit: Payer: Self-pay | Admitting: Family Medicine

## 2022-10-29 DIAGNOSIS — E039 Hypothyroidism, unspecified: Secondary | ICD-10-CM

## 2022-10-29 DIAGNOSIS — G4700 Insomnia, unspecified: Secondary | ICD-10-CM

## 2022-10-29 NOTE — Telephone Encounter (Signed)
Requested medication (s) are due for refill today:   Yes for both  Requested medication (s) are on the active medication list:   Yes for both   Future visit scheduled:   Yes 11/23/2022   Last ordered: Synthroid 10/02/21 #90, 4 refills;   Ambien 04/09/2022 #60, 5 refills  Returned because TSH due and the Ambien is a non delegated refill   Requested Prescriptions  Pending Prescriptions Disp Refills   levothyroxine (SYNTHROID) 125 MCG tablet [Pharmacy Med Name: LEVOTHYROXINE SODIUM 125 MCG TAB] 90 tablet 4    Sig: TAKE 1 TABLET BY MOUTH DAILY     Endocrinology:  Hypothyroid Agents Failed - 10/29/2022  3:02 PM      Failed - TSH in normal range and within 360 days    TSH  Date Value Ref Range Status  08/19/2021 1.360 0.450 - 4.500 uIU/mL Final         Passed - Valid encounter within last 12 months    Recent Outpatient Visits           10 months ago URI, acute   [place], [place], PA-C   11 months ago Type 2 diabetes mellitus without complication, without long-term current use of insulin ([place])   [employer], Donald E, MD   1 year ago [place] and fatigue   [place], Donald E, MD   1 year ago Type 2 diabetes mellitus without complication, without long-term current use of insulin ([place])   [place], Donald E, MD   2 years ago Type 2 diabetes mellitus without complication, without long-term current use of insulin ([place])   [place], MD       Future Appointments             In 3 weeks [place], Kirstie Peri, MD [place], [facility]             zolpidem (AMBIEN) 10 MG tablet [Pharmacy Med Name: ZOLPIDEM TARTRATE 10 MG TAB] 60 tablet     Sig: TAKE 1 TO 2 TABLETS BY MOUTH AT BEDTIME AS NEEDED FOR SLEEP     Not Delegated - Psychiatry:  Anxiolytics/Hypnotics Failed - 10/29/2022  3:02  PM      Failed - This refill cannot be delegated      Failed - Urine Drug Screen completed in last 360 days      Failed - Valid encounter within last 6 months    Recent Outpatient Visits           10 months ago URI, acute   [place], [place], PA-C   11 months ago Type 2 diabetes mellitus without complication, without long-term current use of insulin ([place])   [place], Donald E, MD   1 year ago [place] and fatigue   [place], Donald E, MD   1 year ago Type 2 diabetes mellitus without complication, without long-term current use of insulin [facility])   [place], Donald E, MD   2 years ago Type 2 diabetes mellitus without complication, without long-term current use of insulin ([place])   [place], Donald E, MD       Future Appointments             In 3 weeks Lelon Huh  E, MD [place]

## 2022-10-30 ENCOUNTER — Other Ambulatory Visit: Payer: Self-pay | Admitting: Orthopedic Surgery

## 2022-10-30 DIAGNOSIS — M4807 Spinal stenosis, lumbosacral region: Secondary | ICD-10-CM

## 2022-11-23 ENCOUNTER — Encounter: Payer: Self-pay | Admitting: Family Medicine

## 2022-11-23 ENCOUNTER — Ambulatory Visit: Payer: BC Managed Care – PPO | Admitting: Family Medicine

## 2022-11-23 VITALS — BP 143/73 | HR 60 | Temp 98.1°F | Ht 71.0 in | Wt 203.0 lb

## 2022-11-23 DIAGNOSIS — E559 Vitamin D deficiency, unspecified: Secondary | ICD-10-CM

## 2022-11-23 DIAGNOSIS — E039 Hypothyroidism, unspecified: Secondary | ICD-10-CM

## 2022-11-23 DIAGNOSIS — E119 Type 2 diabetes mellitus without complications: Secondary | ICD-10-CM

## 2022-11-23 DIAGNOSIS — E785 Hyperlipidemia, unspecified: Secondary | ICD-10-CM | POA: Diagnosis not present

## 2022-11-23 DIAGNOSIS — I10 Essential (primary) hypertension: Secondary | ICD-10-CM

## 2022-11-23 DIAGNOSIS — Z125 Encounter for screening for malignant neoplasm of prostate: Secondary | ICD-10-CM

## 2022-11-23 DIAGNOSIS — G629 Polyneuropathy, unspecified: Secondary | ICD-10-CM

## 2022-11-23 DIAGNOSIS — R490 Dysphonia: Secondary | ICD-10-CM

## 2022-11-23 NOTE — Progress Notes (Signed)
Established patient visit   Patient: Keith Carpenter   DOB: 1956/02/19   67 y.o. Male  MRN: [id number] Visit Date: 11/23/2022  Today's healthcare provider: Lelon Huh, MD   Chief Complaint  Patient presents with   Diabetes   Hypertension   Hyperlipidemia   Hypothyroidism   Subjective    Diabetes Pertinent negatives for diabetes include no chest pain.  Hypertension Pertinent negatives include no chest pain, palpitations or shortness of breath.  Hyperlipidemia Pertinent negatives include no chest pain or shortness of breath.   Here today to follow up on diabetes, htn, lipids, and hypothyroid. Generally feels well, is tolerating medications well. He does report progressive hoarseness over the course of the last year, has sensation of needing to clear his throat, but no heartburn, dysphagia, nasal congestion, sinus congestion, or drainage. Does feel a little sore on left side of throat at times.   Medications: Outpatient Medications Prior to Visit  Medication Sig   amLODipine (NORVASC) 5 MG tablet TAKE 1 TABLET BY MOUTH DAILY   atorvastatin (LIPITOR) 10 MG tablet TAKE 1 TABLET BY MOUTH EVERY EVENING   clobetasol ointment (TEMOVATE) 0.05 % Apply twice daily to active areas on your elbows until smooth then stop.   dicyclomine (BENTYL) 10 MG capsule TAKE 1 CAPSULE BY MOUTH 4 TIMES DAILY BEFORE MEALS AND AT BEDTIME   DULoxetine (CYMBALTA) 30 MG capsule TAKE 1 CAPSULE BY MOUTH DAILY   folic acid (FOLVITE) 1 MG tablet Take 1 mg by mouth 2 (two) times daily.   gabapentin (NEURONTIN) 300 MG capsule Take 1 capsule by mouth 2 (two) times daily.   levothyroxine (SYNTHROID) 125 MCG tablet TAKE 1 TABLET BY MOUTH DAILY   lisinopril (ZESTRIL) 5 MG tablet Take 5 mg by mouth daily.   methotrexate 250 MG/10ML injection Inject into the skin.   metoprolol tartrate (LOPRESSOR) 50 MG tablet TAKE 1 TABLET BY MOUTH TWICE A DAY   tacrolimus (PROTOPIC) 0.1 % ointment Apply topically.    tadalafil (CIALIS) 10 MG tablet TAKE 1 TABLET BY MOUTH DAILY   Testosterone 20.25 MG/ACT (1.62%) GEL Place 2 Pump onto the skin daily.   triamcinolone cream (KENALOG) 0.1 %    TUBERCULIN SYR 1CC/27GX1/2" 27G X 1/2" 1 ML MISC Inject into the skin.   zolpidem (AMBIEN) 10 MG tablet TAKE 1 TO 2 TABLETS BY MOUTH AT BEDTIME AS NEEDED FOR SLEEP   No facility-administered medications prior to visit.    Review of Systems  Constitutional:  Negative for appetite change, chills and fever.  HENT:  Positive for sore throat and voice change. Negative for ear pain, facial swelling, postnasal drip, rhinorrhea, sinus pressure, sinus pain, sneezing and trouble swallowing.   Respiratory:  Negative for chest tightness, shortness of breath and wheezing.   Cardiovascular:  Negative for chest pain and palpitations.  Gastrointestinal:  Negative for abdominal pain, nausea and vomiting.       Objective    BP (!) 143/73 (BP Location: Left Arm, Patient Position: Sitting, Cuff Size: Normal)   Pulse 60   Temp 98.1 F (36.7 C)   Ht '5\' 11"'$  (1.803 m)   Wt 203 lb (92.1 kg)   SpO2 98%   BMI 28.31 kg/m    Physical Exam   General Appearance:    Well developed, well nourished male, alert, cooperative, in no acute distress  HENT:   ENT exam normal, no neck nodes or sinus tenderness  Eyes:    PERRL, conjunctiva/corneas clear, EOM's intact  Lungs:     Clear to auscultation bilaterally, respirations unlabored  Heart:    Normal heart rate. Normal rhythm. No murmurs, rubs, or gallops.    Neurologic:   Awake, alert, oriented x 3. No apparent focal neurological           defect.         Assessment & Plan     1. Essential hypertension Well controlled.  Continue current medications.    2. Type 2 diabetes mellitus without complication, without long-term current use of insulin (HCC) Diet controlled.  - Hemoglobin A1c  3. Hyperlipidemia, unspecified hyperlipidemia type He is tolerating atorvastatin well with no  adverse effects.   - CBC - Comprehensive metabolic panel - Lipid panel  4. Vitamin D deficiency   5. Hypothyroidism, unspecified type  - TSH - T4, free  6. Neuropathy Fairly well controlled. Continue current medications.    7. Prostate cancer screening  - PSA Total (Reflex To Free) ([facility] only)  8. Hoarseness  - Ambulatory referral to ENT         Lelon Huh, MD  [place] [phone] (phone) [phone] (fax)  [place]

## 2022-11-24 LAB — COMPREHENSIVE METABOLIC PANEL
ALT: 23 IU/L (ref 0–44)
AST: 20 IU/L (ref 0–40)
Albumin/Globulin Ratio: 2 (ref 1.2–2.2)
Albumin: 4.9 g/dL (ref 3.9–4.9)
Alkaline Phosphatase: 93 IU/L (ref 44–121)
BUN/Creatinine Ratio: 15 (ref 10–24)
BUN: 16 mg/dL (ref 8–27)
Bilirubin Total: 0.5 mg/dL (ref 0.0–1.2)
CO2: 26 mmol/L (ref 20–29)
Calcium: 9.3 mg/dL (ref 8.6–10.2)
Chloride: 100 mmol/L (ref 96–106)
Creatinine, Ser: 1.07 mg/dL (ref 0.76–1.27)
Globulin, Total: 2.4 g/dL (ref 1.5–4.5)
Glucose: 96 mg/dL (ref 70–99)
Potassium: 5 mmol/L (ref 3.5–5.2)
Sodium: 140 mmol/L (ref 134–144)
Total Protein: 7.3 g/dL (ref 6.0–8.5)
eGFR: 77 mL/min/{1.73_m2} (ref >59)

## 2022-11-24 LAB — LIPID PANEL
Chol/HDL Ratio: 3.9 ratio (ref 0.0–5.0)
Cholesterol, Total: 174 mg/dL (ref 100–199)
HDL: 45 mg/dL (ref >39)
LDL Chol Calc (NIH): 116 mg/dL — ABNORMAL HIGH (ref 0–99)
Triglycerides: 66 mg/dL (ref 0–149)
VLDL Cholesterol Cal: 13 mg/dL (ref 5–40)

## 2022-11-24 LAB — CBC
Hematocrit: 45.3 % (ref 37.5–51.0)
Hemoglobin: 15.4 g/dL (ref 13.0–17.7)
MCH: 31.4 pg (ref 26.6–33.0)
MCHC: 34 g/dL (ref 31.5–35.7)
MCV: 92 fL (ref 79–97)
Platelets: 259 10*3/uL (ref 150–450)
RBC: 4.9 x10E6/uL (ref 4.14–5.80)
RDW: 13.6 % (ref 11.6–15.4)
WBC: 7.3 10*3/uL (ref 3.4–10.8)

## 2022-11-24 LAB — PSA TOTAL (REFLEX TO FREE): Prostate Specific Ag, Serum: 3.9 ng/mL (ref 0.0–4.0)

## 2022-11-24 LAB — HEMOGLOBIN A1C
Est. average glucose Bld gHb Est-mCnc: 123 mg/dL
Hgb A1c MFr Bld: 5.9 % — ABNORMAL HIGH (ref 4.8–5.6)

## 2022-11-24 LAB — T4, FREE: Free T4: 1.42 ng/dL (ref 0.82–1.77)

## 2022-11-24 LAB — TSH: TSH: 0.706 u[IU]/mL (ref 0.450–4.500)

## 2022-11-28 LAB — VITAMIN D 25 HYDROXY (VIT D DEFICIENCY, FRACTURES): Vit D, 25-Hydroxy: 21.4 ng/mL — ABNORMAL LOW (ref 30.0–100.0)

## 2022-11-28 LAB — SPECIMEN STATUS REPORT

## 2022-11-30 ENCOUNTER — Other Ambulatory Visit: Payer: Self-pay | Admitting: Family Medicine

## 2022-11-30 DIAGNOSIS — I10 Essential (primary) hypertension: Secondary | ICD-10-CM

## 2022-12-10 ENCOUNTER — Other Ambulatory Visit: Payer: Self-pay | Admitting: Family Medicine

## 2022-12-10 DIAGNOSIS — F32A Depression, unspecified: Secondary | ICD-10-CM

## 2022-12-29 ENCOUNTER — Ambulatory Visit: Payer: BC Managed Care – PPO | Attending: Unknown Physician Specialty | Admitting: Speech Pathology

## 2023-01-21 ENCOUNTER — Other Ambulatory Visit: Payer: Self-pay | Admitting: Family Medicine

## 2023-01-21 DIAGNOSIS — G4700 Insomnia, unspecified: Secondary | ICD-10-CM

## 2023-01-21 NOTE — Telephone Encounter (Signed)
Requested medications are due for refill today.  Provider to determine  Requested medications are on the active medications list.  yes  Last refill. 10/29/2022 #60 1 rf  Future visit scheduled.   no  Notes to clinic.  Refill not delegated.    Requested Prescriptions  Pending Prescriptions Disp Refills   zolpidem (AMBIEN) 10 MG tablet [Pharmacy Med Name: ZOLPIDEM TARTRATE 10 MG TAB] 60 tablet     Sig: TAKE 1 TO 2 TABLETS BY MOUTH AT BEDTIME AS NEEDED FOR SLEEP     Not Delegated - Psychiatry:  Anxiolytics/Hypnotics Failed - 01/21/2023  4:56 PM      Failed - This refill cannot be delegated      Failed - Urine Drug Screen completed in last 360 days      Passed - Valid encounter within last 6 months    Recent Outpatient Visits           1 month ago Essential hypertension   [place] [facility] Malva Limes, MD   1 year ago URI, acute   [place] [facility] [place], [place], PA-C   1 year ago Type 2 diabetes mellitus without complication, without long-term current use of insulin [facility])   [place] [facility] Malva Limes, MD   1 year ago [place] and fatigue   [place] [facility] Malva Limes, MD   2 years ago Type 2 diabetes mellitus without complication, without long-term current use of insulin [facility])   [place] [facility] Sherrie Mustache, Demetrios Isaacs, MD

## 2023-03-25 ENCOUNTER — Telehealth: Payer: Self-pay

## 2023-03-25 DIAGNOSIS — L4050 Arthropathic psoriasis, unspecified: Secondary | ICD-10-CM

## 2023-03-25 NOTE — Telephone Encounter (Signed)
Copied from CRM [id number]. Topic: Referral - Request for Referral >> Mar 24, 2023  4:03 PM Patsy Lager T wrote: Has patient seen PCP for this complaint? Yes.   *If NO, is insurance requiring patient see PCP for this issue before PCP can refer them? Referral for which specialty: Rheumatologist Preferred provider/office: unknown Reason for referral: pain

## 2023-03-29 NOTE — Telephone Encounter (Signed)
Patient reports he has not been able to be seen at [facility] rheumatology over 2 years now. Due to provider been out twice and once he had to cancel and reschedule. Patient requesting referral to new rheumatologist. He prefers in town.

## 2023-03-29 NOTE — Telephone Encounter (Signed)
Need more info, why does he need referral, is he changing rheumatologist? Does he need referral back to same one? Is referral for insurance? What's the medical reason for referral? What's the deal?

## 2023-04-02 NOTE — Addendum Note (Signed)
Addended by: Malva Limes on: 04/02/2023 04:41 PM   Modules accepted: Orders

## 2023-04-05 ENCOUNTER — Other Ambulatory Visit: Payer: Self-pay | Admitting: Family Medicine

## 2023-04-05 DIAGNOSIS — I10 Essential (primary) hypertension: Secondary | ICD-10-CM

## 2023-04-26 ENCOUNTER — Other Ambulatory Visit: Payer: Self-pay | Admitting: Family Medicine

## 2023-04-26 DIAGNOSIS — I10 Essential (primary) hypertension: Secondary | ICD-10-CM

## 2023-04-26 DIAGNOSIS — E039 Hypothyroidism, unspecified: Secondary | ICD-10-CM

## 2023-04-26 NOTE — Telephone Encounter (Signed)
Patient called, left VM to return the call to the office. After listening to the recording, patient mentioned levothyroxine and metoprolol. Will need to verify if these are the 2 medications needing refilled. Patient has upcoming appointment on 05/03/23 and will need to keep that appointment for future refills.

## 2023-04-26 NOTE — Telephone Encounter (Signed)
Pt is calling to check on the status of his medication please advise CB) [phone]

## 2023-04-28 MED ORDER — LEVOTHYROXINE SODIUM 125 MCG PO TABS: 125.0000 ug | ORAL_TABLET | Freq: Every day | ORAL | 0 refills | Status: DC

## 2023-04-28 MED ORDER — METOPROLOL TARTRATE 50 MG PO TABS: 50.0000 mg | ORAL_TABLET | Freq: Two times a day (BID) | ORAL | 0 refills | Status: DC

## 2023-04-28 NOTE — Telephone Encounter (Signed)
Requested Prescriptions  Pending Prescriptions Disp Refills   levothyroxine (SYNTHROID) 125 MCG tablet 90 tablet 1    Sig: Take 1 tablet (125 mcg total) by mouth daily.     Endocrinology:  Hypothyroid Agents Passed - 04/26/2023  3:45 PM      Passed - TSH in normal range and within 360 days    TSH  Date Value Ref Range Status  11/23/2022 0.706 0.450 - 4.500 uIU/mL Final         Passed - Valid encounter within last 12 months    Recent Outpatient Visits           5 months ago Essential hypertension   [place] [facility] Malva Limes, MD   1 year ago URI, acute   [place] [facility] [place], [place], PA-C   1 year ago Type 2 diabetes mellitus without complication, without long-term current use of insulin (HCC)   [place] [facility] Malva Limes, MD   1 year ago [place] and fatigue   [place] [facility] Malva Limes, MD   2 years ago Type 2 diabetes mellitus without complication, without long-term current use of insulin (HCC)   [place] [facility] Malva Limes, MD       Future Appointments             In 5 days Fisher, Demetrios Isaacs, MD [facility]             metoprolol tartrate (LOPRESSOR) 50 MG tablet 60 tablet 0    Sig: Take 1 tablet (50 mg total) by mouth 2 (two) times daily. Please schedule office visit before any future refills.     Cardiovascular:  Beta Blockers Failed - 04/26/2023  3:45 PM      Failed - Last BP in normal range    BP Readings from Last 1 Encounters:  11/23/22 (!) 143/73         Passed - Last Heart Rate in normal range    Pulse Readings from Last 1 Encounters:  11/23/22 60         Passed - Valid encounter within last 6 months    Recent Outpatient Visits           5 months ago Essential hypertension   [place] [facility] Malva Limes, MD   1 year ago URI, acute   [place]  [facility] [place], [place], PA-C   1 year ago Type 2 diabetes mellitus without complication, without long-term current use of insulin [facility])   [place] [facility] Malva Limes, MD   1 year ago [place] and fatigue   [place] [facility] Malva Limes, MD   2 years ago Type 2 diabetes mellitus without complication, without long-term current use of insulin (HCC)   [facility] [facility] Malva Limes, MD       Future Appointments             In 5 days Fisher, Demetrios Isaacs, MD [facility]

## 2023-05-03 ENCOUNTER — Ambulatory Visit (INDEPENDENT_AMBULATORY_CARE_PROVIDER_SITE_OTHER): Payer: BC Managed Care – PPO | Admitting: Family Medicine

## 2023-05-03 VITALS — BP 124/69 | HR 57 | Ht 71.0 in | Wt 210.3 lb

## 2023-05-03 DIAGNOSIS — G629 Polyneuropathy, unspecified: Secondary | ICD-10-CM

## 2023-05-03 DIAGNOSIS — E559 Vitamin D deficiency, unspecified: Secondary | ICD-10-CM

## 2023-05-03 DIAGNOSIS — I10 Essential (primary) hypertension: Secondary | ICD-10-CM

## 2023-05-03 DIAGNOSIS — E039 Hypothyroidism, unspecified: Secondary | ICD-10-CM | POA: Diagnosis not present

## 2023-05-03 DIAGNOSIS — E119 Type 2 diabetes mellitus without complications: Secondary | ICD-10-CM | POA: Diagnosis not present

## 2023-05-03 MED ORDER — SILDENAFIL CITRATE 100 MG PO TABS: 50.0000 mg | ORAL_TABLET | Freq: Every day | ORAL | 1 refills | Status: DC | PRN

## 2023-05-03 MED ORDER — SILDENAFIL CITRATE 50 MG PO TABS: 50.0000 mg | ORAL_TABLET | Freq: Every day | ORAL | 2 refills | Status: AC | PRN

## 2023-05-04 LAB — HEMOGLOBIN A1C
Est. average glucose Bld gHb Est-mCnc: 131 mg/dL
Hgb A1c MFr Bld: 6.2 % — ABNORMAL HIGH (ref 4.8–5.6)

## 2023-05-04 LAB — VITAMIN D 25 HYDROXY (VIT D DEFICIENCY, FRACTURES): Vit D, 25-Hydroxy: 29.3 ng/mL — ABNORMAL LOW (ref 30.0–100.0)

## 2023-05-05 NOTE — Progress Notes (Signed)
Established patient visit   Patient: Keith Carpenter   DOB: 1956/08/23   67 y.o. Male  MRN: [id number] Visit Date: 05/03/2023  Today's healthcare provider: Mila Merry, MD   Chief Complaint  Patient presents with   Medication Refill   Medical Management of Chronic Issues   Subjective    Discussed the use of AI scribe software for clinical note transcription with the patient, who gave verbal consent to proceed.  History of Present Illness   The patient, with a history of chronic pain, recently discontinued Cymbalta due to suspected interaction with Zolpidem. He was previously taking Cymbalta for pain management, but has since started Gabapentin and Methotrexate. However, Methotrexate was temporarily discontinued due to a recent COVID-19 infection, with the last dose taken on July 27th. The patient is unsure when to resume Methotrexate and is awaiting guidance from his doctor.  The patient also reports issues with erectile dysfunction, stating that Cialis has not been effective for several months. He expresses a willingness to try a different medication for this issue.  In addition, the patient has been prescribed testosterone gel, but admits to frequently forgetting to apply it. He expresses a preference for an oral medication, if available.  Lastly, the patient mentions a recent positive report regarding an aneurysm, which has decreased in size from 4.4 to 4.3. He has been monitoring his blood pressure daily, which has remained consistent. He has also been taking Vitamin D supplements, but admits to forgetting to take them in the past couple of weeks.       Medications: Outpatient Medications Prior to Visit  Medication Sig Note   amLODipine (NORVASC) 5 MG tablet TAKE 1 TABLET BY MOUTH DAILY    atorvastatin (LIPITOR) 10 MG tablet TAKE 1 TABLET BY MOUTH EVERY EVENING    Cholecalciferol (VITAMIN D3) 50 MCG (2000 UT) capsule Take 2,000 Units by mouth daily.    clobetasol  ointment (TEMOVATE) 0.05 % Apply twice daily to active areas on your elbows until smooth then stop.    folic acid (FOLVITE) 1 MG tablet Take 1 mg by mouth 2 (two) times daily.    gabapentin (NEURONTIN) 300 MG capsule TAKE ONE CAPSULE BY MOUTH TWICE A DAY    levothyroxine (SYNTHROID) 125 MCG tablet Take 1 tablet (125 mcg total) by mouth daily.    methotrexate 250 MG/10ML injection Inject into the skin.    metoprolol tartrate (LOPRESSOR) 50 MG tablet Take 1 tablet (50 mg total) by mouth 2 (two) times daily. Please schedule office visit before any future refills.    tacrolimus (PROTOPIC) 0.1 % ointment Apply topically.    tadalafil (CIALIS) 10 MG tablet TAKE 1 TABLET BY MOUTH DAILY    triamcinolone cream (KENALOG) 0.1 %     TUBERCULIN SYR 1CC/27GX1/2" 27G X 1/2" 1 ML MISC Inject into the skin.    zolpidem (AMBIEN) 10 MG tablet TAKE 1 TO 2 TABLETS BY MOUTH AT BEDTIME AS NEEDED FOR SLEEP    [DISCONTINUED] dicyclomine (BENTYL) 10 MG capsule TAKE 1 CAPSULE BY MOUTH 4 TIMES DAILY BEFORE MEALS AND AT BEDTIME (Patient not taking: Reported on 05/03/2023)    [DISCONTINUED] DULoxetine (CYMBALTA) 30 MG capsule TAKE 1 CAPSULE BY MOUTH DAILY (Patient not taking: Reported on 05/03/2023)    [DISCONTINUED] lisinopril (ZESTRIL) 5 MG tablet Take 5 mg by mouth daily. (Patient not taking: Reported on 05/03/2023) 05/03/2023: previously stopped   [DISCONTINUED] Testosterone 20.25 MG/ACT (1.62%) GEL Place 2 Pump onto the skin daily. (Patient not  taking: Reported on 05/03/2023)    No facility-administered medications prior to visit.  onal):1}   Objective    BP 124/69 (BP Location: Left Arm, Patient Position: Sitting, Cuff Size: Normal)   Pulse (!) 57   Ht 5\' 11"  (1.803 m)   Wt 210 lb 4.8 oz (95.4 kg)   SpO2 96%   BMI 29.33 kg/m   Physical Exam   General: Appearance:    Well developed, well nourished male in no acute distress  Eyes:    PERRL, conjunctiva/corneas clear, EOM's intact       Lungs:     Clear to  auscultation bilaterally, respirations unlabored  Heart:    Bradycardic. Normal rhythm. No murmurs, rubs, or gallops.    MS:   All extremities are intact.    Neurologic:   Awake, alert, oriented x 3. No apparent focal neurological defect.         Assessment & Plan     Assessment and Plan    Erectile Dysfunction Cialis ineffective despite doubling the dose. No nitroglycerin use. Patient willing to try a different medication. -Discontinue Cialis. -Prescribe Viagra 50mg . Start with one pill and can double up if needed, monitoring for side effects.  Testosterone Deficiency Patient forgets to apply testosterone gel, prefers oral medication. Last testosterone level was low. -Consider alternative testosterone replacement options.  Vitamin D Deficiency Patient has been taking Vitamin D supplement but forgot to take it for the last couple of weeks. -Continue Vitamin D supplement at 2000 units daily.  Methotrexate Use Patient stopped methotrexate due to COVID-19 infection and unsure when to restart. Last dose was on July 27th. -Clarify with prescribing doctor when to restart methotrexate.  Aortic Aneurysm Aneurysm size decreased from 4.4 to 4.3. Blood pressure has been consistent. -Continue current management.  General Health Maintenance -Check Vitamin D and A1C levels today. -Refill metoprolol and levothyroxine prescriptions. -Remove Lisinopril from medication list as patient has not been taking it for a couple of years.       No follow-ups on file.      Mila Merry, MD  [facility] [phone] (phone) [phone] (fax)  [facility]

## 2023-06-24 ENCOUNTER — Other Ambulatory Visit: Payer: Self-pay | Admitting: Family Medicine

## 2023-06-24 DIAGNOSIS — G4700 Insomnia, unspecified: Secondary | ICD-10-CM

## 2023-06-24 DIAGNOSIS — I10 Essential (primary) hypertension: Secondary | ICD-10-CM

## 2023-06-24 NOTE — Telephone Encounter (Signed)
Requested Prescriptions  Pending Prescriptions Disp Refills   metoprolol tartrate (LOPRESSOR) 50 MG tablet [Pharmacy Med Name: METOPROLOL TARTRATE 50 MG TAB] 180 tablet 1    Sig: TAKE 1 TABLET BY MOUTH TWICE A DAY     Cardiovascular:  Beta Blockers Passed - 06/24/2023  3:54 PM      Passed - Last BP in normal range    BP Readings from Last 1 Encounters:  05/03/23 124/69         Passed - Last Heart Rate in normal range    Pulse Readings from Last 1 Encounters:  05/03/23 (!) 57         Passed - Valid encounter within last 6 months    Recent Outpatient Visits           1 month ago Type 2 diabetes mellitus without complication, without long-term current use of insulin (HCC)   [place] [facility] Malva L[facility], [facility]   7 months ago Essential hypertension   [place] [facility] Malva Limes, MD   1 year ago URI, acute   [place] [facility] [place], [place], PA-C   1 year ago Type 2 diabetes mellitus without complication, without long-term current use of insulin [facility])   [place] [facility] Malva Limes, MD   1 year ago [place] and fatigue   [facility] Malva Limes, MD

## 2023-06-24 NOTE — Telephone Encounter (Signed)
Requested medication (s) are due for refill today: yes  Requested medication (s) are on the active medication list: yes  Last refill:  01/22/23 #60/3  Future visit scheduled: no  Notes to clinic:  Unable to refill per protocol, cannot delegate.      Requested Prescriptions  Pending Prescriptions Disp Refills   zolpidem (AMBIEN) 10 MG tablet [Pharmacy Med Name: ZOLPIDEM TARTRATE 10 MG TAB] 60 tablet     Sig: TAKE 1 TO 2 TABLETS BY MOUTH AT BEDTIME AS NEEDED FOR SLEEP     Not Delegated - Psychiatry:  Anxiolytics/Hypnotics Failed - 06/24/2023  3:51 PM      Failed - This refill cannot be delegated      Failed - Urine Drug Screen completed in last 360 days      Passed - Valid encounter within last 6 months    Recent Outpatient Visits           1 month ago Type 2 diabetes mellitus without complication, without long-term current use of insulin (HCC)   [place] [facility] Malva L[facility], [facility]   7 months ago Essential hypertension   [place] [facility] Malva L[facility], MD   1 year ago URI, acute   [place] [facility] [place], [place], PA-C   1 year ago Type 2 diabetes mellitus without complication, without long-term current use of insulin [facility])   [place] [facility] Malva Limes, MD   1 year ago [place] and fatigue   [facility] Malva Limes, MD

## 2023-07-05 ENCOUNTER — Telehealth: Payer: Self-pay | Admitting: Family Medicine

## 2023-07-05 NOTE — Telephone Encounter (Signed)
Zolpidem Tartrate 10 mg. Is not covered by [facility].  Sending a paper from [employer] back for reference.

## 2023-08-11 ENCOUNTER — Other Ambulatory Visit: Payer: Self-pay | Admitting: Family Medicine

## 2023-08-11 DIAGNOSIS — E039 Hypothyroidism, unspecified: Secondary | ICD-10-CM

## 2023-08-11 NOTE — Telephone Encounter (Signed)
Requested Prescriptions  Pending Prescriptions Disp Refills   levothyroxine (SYNTHROID) 125 MCG tablet [Pharmacy Med Name: LEVOTHYROXINE SODIUM 125 MCG TAB] 90 tablet 0    Sig: TAKE 1 TABLET BY MOUTH DAILY     Endocrinology:  Hypothyroid Agents Passed - 08/11/2023  4:44 PM      Passed - TSH in normal range and within 360 days    TSH  Date Value Ref Range Status  11/23/2022 0.706 0.450 - 4.500 uIU/mL Final         Passed - Valid encounter within last 12 months    Recent Outpatient Visits           3 months ago Type 2 diabetes mellitus without complication, without long-term current use of insulin (HCC)   [place] [facility] Malva Limes, MD   8 months ago Essential hypertension   [place] [facility] Malva Limes, MD   1 year ago URI, acute   [place] [facility] [place], [place], PA-C   1 year ago Type 2 diabetes mellitus without complication, without long-term current use of insulin [facility])   [place] [facility] Malva Limes, MD   1 year ago [place] and fatigue   [facility] Malva Limes, MD

## 2023-08-19 ENCOUNTER — Ambulatory Visit: Payer: Self-pay

## 2023-08-19 ENCOUNTER — Ambulatory Visit: Payer: Self-pay | Admitting: *Deleted

## 2023-08-19 NOTE — Telephone Encounter (Signed)
Third attempt to reach pt. Left message to call back.

## 2023-08-19 NOTE — Telephone Encounter (Signed)
  Chief Complaint: Wanting to know if he still needs a covid booster and if so how long should he wait after having covid before getting it.   He had covid the beginning of August this year and also had it in August of 2023.     Symptoms: N/A Frequency: N/A Pertinent Negatives: Patient denies N/A Disposition: [] ED /[] Urgent Care (no appt availability in office) / [] Appointment(In office/virtual)/ []  [facility]/ [] Home Care/ [] Refused Recommended Disposition /[] [place] [facility]/ [x]  Follow-up with PCP Additional Notes: Pt agreeable to someone calling him back with Dr. Theodis Aguas advice.

## 2023-08-19 NOTE — Telephone Encounter (Signed)
Patient called, left VM to return the call to the office to speak to the NT.    Summary: Covid questions   Pt wants to discuss when to receive his covid booster, please advise  Best contact: [phone]

## 2023-08-19 NOTE — Telephone Encounter (Signed)
Reason for Disposition  [1] Caller requesting NON-URGENT health information AND [2] PCP's office is the best resource  Answer Assessment - Initial Assessment Questions 1. REASON FOR CALL or QUESTION: "What is your reason for calling today?" or "How can I best help you?" or "What question do you have that I can help answer?"     I called earlier about when I was supposed to take the Covid booster shot.   I had covid the first week in August.   I've not had the covid booster shot yet.    I didn't know if I should still get the covid booster or not.   Should I wait a certain length of time after having covid in order to get the booster?  I let him know I would pass his question along to Dr. Sherrie Mustache and get his input on the covid booster and someone will call him back.   He was agreeable to this plan.  Protocols used: Information Only Call - No Triage-A-AH

## 2023-08-19 NOTE — Telephone Encounter (Signed)
2nd attempt, Patient called, left VM to return the call to the office to speak to the NT.

## 2023-08-20 NOTE — Telephone Encounter (Signed)
Patient called and advised of the below from Dr. Sherrie Mustache, he verbalized understanding and says he will call for the appointment to get it done.

## 2023-08-20 NOTE — Telephone Encounter (Signed)
Yes, I recommend getting covid booster. Booster should be 3-6 months after having covid, so now would be a good time to get it. Especially with the holidays coming up.

## 2023-08-20 NOTE — Telephone Encounter (Signed)
Detailed VM left per DPR. CRM created. Ok for [facility] to advise/clarify if patient returns call

## 2023-08-20 NOTE — Telephone Encounter (Signed)
Please see other telephone encounter.

## 2023-09-03 ENCOUNTER — Encounter: Payer: Self-pay | Admitting: Family Medicine

## 2023-09-03 NOTE — Telephone Encounter (Signed)
 Care team updated and letter sent for eye exam notes.

## 2023-09-27 ENCOUNTER — Telehealth: Payer: Self-pay | Admitting: Family Medicine

## 2023-09-27 DIAGNOSIS — G4700 Insomnia, unspecified: Secondary | ICD-10-CM

## 2023-09-27 NOTE — Telephone Encounter (Signed)
 2nd request:  A telephone message was sent in Oct of 24 about pt insurance not wanting to pay for Zolpidem  Tart. 10 mg.   It looks like the message was never addressed.    Patient brought 2nd letter in today stating patient needed a prior authorization or new medication that is on formulary.  Im sending letter back.

## 2023-09-28 NOTE — Telephone Encounter (Signed)
 Adding to original encounter from 07/05/23

## 2023-09-30 NOTE — Telephone Encounter (Signed)
It is covered, but insurance will only allow 1 tablet per day.  which is the maximum FDA approved dose. He can either take just 1 tablet a day, or we can change to Lunesta 3mg , although I don't know if that is on formulary, or if it will be any more effective than 10mg  Ambien.

## 2023-09-30 NOTE — Telephone Encounter (Signed)
Dr. Sherrie Mustache do you have his letter? Is the medicine going to change or does he need a PA? Not sure if it is the same insurance that he has now. Please let us know if PA is needed so that we can see if patient has same insurance plan for this year.

## 2023-10-01 NOTE — Telephone Encounter (Signed)
LVMTCB. CRM created. Ok for [facility] to advise per dr.fisher

## 2023-10-05 NOTE — Telephone Encounter (Signed)
Pt states that he would like to stay on Ambien and the 1 tablet doesn't work for him but the 1.5 tabs does. Pt states that the letter states that for 2nd option a PA can be done to show pt meets criteria for different dosage. Pt states he would like to see if PA can be done to cover the medication. With [employer] he had he was paying for 30 tabs and insurance was covering over 30 tabs and he understands from letter on 09/19/23 that it would be the same way. Pt states he took Lunesta years ago and wasn't working and switched to Ambien. I advised pt I would send this to PCP for review and have nurse FU with further recommendations.

## 2023-10-06 MED ORDER — ZOLPIDEM TARTRATE 10 MG PO TABS: 15.0000 mg | ORAL_TABLET | Freq: Every evening | ORAL | 3 refills | Status: DC | PRN

## 2023-10-06 NOTE — Addendum Note (Signed)
Addended by: Malva Limes on: 10/06/2023 12:44 PM   Modules accepted: Orders

## 2023-10-06 NOTE — Telephone Encounter (Signed)
[facility] should initiate PA process

## 2023-10-14 ENCOUNTER — Other Ambulatory Visit: Payer: Self-pay | Admitting: Family Medicine

## 2023-10-14 DIAGNOSIS — E785 Hyperlipidemia, unspecified: Secondary | ICD-10-CM

## 2023-11-25 ENCOUNTER — Other Ambulatory Visit: Payer: Self-pay | Admitting: Family Medicine

## 2023-11-25 DIAGNOSIS — E039 Hypothyroidism, unspecified: Secondary | ICD-10-CM

## 2023-12-23 ENCOUNTER — Other Ambulatory Visit: Payer: Self-pay | Admitting: Family Medicine

## 2023-12-23 DIAGNOSIS — I10 Essential (primary) hypertension: Secondary | ICD-10-CM

## 2023-12-30 ENCOUNTER — Ambulatory Visit: Payer: Self-pay

## 2023-12-30 DIAGNOSIS — L4050 Arthropathic psoriasis, unspecified: Secondary | ICD-10-CM

## 2023-12-30 DIAGNOSIS — M5126 Other intervertebral disc displacement, lumbar region: Secondary | ICD-10-CM

## 2023-12-30 NOTE — Addendum Note (Signed)
 Addended by: Lamon Pillow on: 12/30/2023 05:37 PM   Modules accepted: Orders

## 2023-12-30 NOTE — Telephone Encounter (Signed)
 Copied from CRM [id number]. Topic: Clinical - Red Word Triage >> Dec 30, 2023  3:50 PM Fredrica W wrote: Red Word that prompted transfer to Nurse Triage: Something happened to back again has happened before, hard to twist or turn, lower lumbar, 8/10 - request referral for physical therapy.  Chief Complaint: back pain Symptoms: lower back pain Frequency: constant Pertinent Negatives: Patient denies numbness, issues with bowel or bladder Disposition: [] ED /[] Urgent Care (no appt availability in office) / [] Appointment(In office/virtual)/ []  [facility]/ [] Home Care/ [] Refused Recommended Disposition /[] [facility]/ [x]  Follow-up with PCP Additional Notes: declined apt.  Wants order for physical therapy.  Care advice given, denies questions; instructed to go to ER if becomes worse.   Reason for Disposition  [1] MODERATE back pain (e.g., interferes with normal activities) AND [2] present > 3 days  Answer Assessment - Initial Assessment Questions 1. ONSET: "When did the pain begin?"      Has happened in the past; states has been walking more; two weeks ago 2. LOCATION: "Where does it hurt?" (upper, mid or lower back)     Lower back 3. SEVERITY: "How bad is the pain?"  (e.g., Scale 1-10; mild, moderate, or severe)   - MILD (1-3): Doesn't interfere with normal activities.    - MODERATE (4-7): Interferes with normal activities or awakens from sleep.    - SEVERE (8-10): Excruciating pain, unable to do any normal activities.      8/10 4. PATTERN: "Is the pain constant?" (e.g., yes, no; constant, intermittent)      constant 5. RADIATION: "Does the pain shoot into your legs or somewhere else?"     denies 6. CAUSE:  "What do you think is causing the back pain?"      no 7. BACK OVERUSE:  "Any recent lifting of heavy objects, strenuous work or exercise?"     no 8. MEDICINES: "What have you taken so far for the pain?" (e.g., nothing, acetaminophen, NSAIDS)     tylenol 9.  NEUROLOGIC SYMPTOMS: "Do you have any weakness, numbness, or problems with bowel/bladder control?"     denies 10. OTHER SYMPTOMS: "Do you have any other symptoms?" (e.g., fever, abdomen pain, burning with urination, blood in urine)       denies 11. PREGNANCY: "Is there any chance you are pregnant?" "When was your last menstrual period?"       na  Protocols used: Back Pain-A-AH

## 2024-02-24 ENCOUNTER — Telehealth: Payer: Self-pay

## 2024-02-24 NOTE — Telephone Encounter (Signed)
 Copied from CRM [id number]. Topic: General - Billing Inquiry >> Feb 24, 2024 12:49 PM DeAngela L wrote: Reason for CRM: Patient calling to make sure he is only being billed to the [facility] and not the medicaid list in his epic chart cause the other 2 medicaid's in his chart are not for him this is for two other ppl who have the same name and dob but in different counties and he doesn't want this to be a concern when he comes to his appt on 02/28/24 he just wants to inform the office to be aware to bill correctly  Last time the patient had this concern it to a while to correct billing this is why the patient is calling in advance so there is no mix up with the billing (patient has all additional questions on the information of the concern) Pt num [phone] (M)

## 2024-02-28 ENCOUNTER — Ambulatory Visit: Admitting: Family Medicine

## 2024-02-28 ENCOUNTER — Encounter: Payer: Self-pay | Admitting: Family Medicine

## 2024-02-28 VITALS — BP 131/65 | HR 52 | Ht 71.0 in | Wt 214.9 lb

## 2024-02-28 DIAGNOSIS — E559 Vitamin D deficiency, unspecified: Secondary | ICD-10-CM | POA: Diagnosis not present

## 2024-02-28 DIAGNOSIS — I10 Essential (primary) hypertension: Secondary | ICD-10-CM | POA: Diagnosis not present

## 2024-02-28 DIAGNOSIS — R350 Frequency of micturition: Secondary | ICD-10-CM

## 2024-02-28 DIAGNOSIS — E119 Type 2 diabetes mellitus without complications: Secondary | ICD-10-CM

## 2024-02-28 DIAGNOSIS — G2581 Restless legs syndrome: Secondary | ICD-10-CM

## 2024-02-28 DIAGNOSIS — Z125 Encounter for screening for malignant neoplasm of prostate: Secondary | ICD-10-CM | POA: Diagnosis not present

## 2024-02-28 DIAGNOSIS — E039 Hypothyroidism, unspecified: Secondary | ICD-10-CM

## 2024-02-28 DIAGNOSIS — G629 Polyneuropathy, unspecified: Secondary | ICD-10-CM

## 2024-02-28 DIAGNOSIS — L4050 Arthropathic psoriasis, unspecified: Secondary | ICD-10-CM

## 2024-02-28 DIAGNOSIS — R609 Edema, unspecified: Secondary | ICD-10-CM

## 2024-02-28 LAB — POCT URINALYSIS DIPSTICK
Bilirubin, UA: NEGATIVE
Blood, UA: NEGATIVE
Glucose, UA: NEGATIVE
Ketones, UA: NEGATIVE
Leukocytes, UA: NEGATIVE
Nitrite, UA: NEGATIVE
Protein, UA: NEGATIVE
Spec Grav, UA: 1.02 (ref 1.010–1.025)
Urobilinogen, UA: 0.2 U/dL
pH, UA: 6 (ref 5.0–8.0)

## 2024-02-29 ENCOUNTER — Ambulatory Visit: Payer: Self-pay | Admitting: Family Medicine

## 2024-02-29 LAB — COMPREHENSIVE METABOLIC PANEL WITH GFR
ALT: 25 IU/L (ref 0–44)
AST: 21 IU/L (ref 0–40)
Albumin: 4.1 g/dL (ref 3.9–4.9)
Alkaline Phosphatase: 86 IU/L (ref 44–121)
BUN/Creatinine Ratio: 12 (ref 10–24)
BUN: 13 mg/dL (ref 8–27)
Bilirubin Total: 0.5 mg/dL (ref 0.0–1.2)
CO2: 23 mmol/L (ref 20–29)
Calcium: 8.7 mg/dL (ref 8.6–10.2)
Chloride: 102 mmol/L (ref 96–106)
Creatinine, Ser: 1.05 mg/dL (ref 0.76–1.27)
Globulin, Total: 2.6 g/dL (ref 1.5–4.5)
Glucose: 114 mg/dL — ABNORMAL HIGH (ref 70–99)
Potassium: 5.2 mmol/L (ref 3.5–5.2)
Sodium: 140 mmol/L (ref 134–144)
Total Protein: 6.7 g/dL (ref 6.0–8.5)
eGFR: 79 mL/min/{1.73_m2} (ref >59)

## 2024-02-29 LAB — T4 AND TSH
T4, Total: 8 ug/dL (ref 4.5–12.0)
TSH: 2.47 u[IU]/mL (ref 0.450–4.500)

## 2024-02-29 LAB — CBC
Hematocrit: 44.5 % (ref 37.5–51.0)
Hemoglobin: 14.7 g/dL (ref 13.0–17.7)
MCH: 31.6 pg (ref 26.6–33.0)
MCHC: 33 g/dL (ref 31.5–35.7)
MCV: 96 fL (ref 79–97)
Platelets: 252 10*3/uL (ref 150–450)
RBC: 4.65 x10E6/uL (ref 4.14–5.80)
RDW: 13.5 % (ref 11.6–15.4)
WBC: 6.7 10*3/uL (ref 3.4–10.8)

## 2024-02-29 LAB — LIPID PANEL
Chol/HDL Ratio: 4.2 ratio (ref 0.0–5.0)
Cholesterol, Total: 148 mg/dL (ref 100–199)
HDL: 35 mg/dL — ABNORMAL LOW (ref >39)
LDL Chol Calc (NIH): 95 mg/dL (ref 0–99)
Triglycerides: 93 mg/dL (ref 0–149)
VLDL Cholesterol Cal: 18 mg/dL (ref 5–40)

## 2024-02-29 LAB — HEMOGLOBIN A1C
Est. average glucose Bld gHb Est-mCnc: 128 mg/dL
Hgb A1c MFr Bld: 6.1 % — ABNORMAL HIGH (ref 4.8–5.6)

## 2024-02-29 LAB — PSA TOTAL (REFLEX TO FREE): Prostate Specific Ag, Serum: 3.4 ng/mL (ref 0.0–4.0)

## 2024-02-29 LAB — VITAMIN D 25 HYDROXY (VIT D DEFICIENCY, FRACTURES): Vit D, 25-Hydroxy: 27.3 ng/mL — ABNORMAL LOW (ref 30.0–100.0)

## 2024-03-01 ENCOUNTER — Other Ambulatory Visit: Payer: Self-pay | Admitting: Family Medicine

## 2024-03-01 DIAGNOSIS — I10 Essential (primary) hypertension: Secondary | ICD-10-CM

## 2024-03-01 DIAGNOSIS — E039 Hypothyroidism, unspecified: Secondary | ICD-10-CM

## 2024-03-14 NOTE — Patient Instructions (Signed)
 Marland Kitchen  Please review the attached list of medications and notify my office if there are any errors.   . Please bring all of your medications to every appointment so we can make sure that our medication list is the same as yours.

## 2024-03-17 NOTE — Progress Notes (Signed)
 Established patient visit   Patient: Keith Carpenter   DOB: 04/29/1956   68 y.o. Male  MRN: [id number] Visit Date: 02/28/2024  Today's healthcare provider: Nancyann Perry, MD   Chief Complaint  Patient presents with   Follow-up   Fatigue    Last couple months    Weight Gain    Gained 10 pounds last 3 months    Edema    Bilateral feet swelling    Urinary Frequency    More throughout the night   Subjective    Discussed the use of AI scribe software for clinical note transcription with the patient, who gave verbal consent to proceed.  History of Present Illness   Ladarren Steiner is a 68 year old male with hypertension, hyperlipidemia, diabetes and hyperthyroid who presents for a checkup and follow-up on his medications.  He reports weight gain over the last several months, which he attributes to overeating. This is unusual for him as he had previously worked hard to lose weight. He mentions a change in his diet, stating he is not eating as many vegetables and fruits and is eating out too much.  He describes frequent urination, which has increased significantly. He has not been able to walk as much due to swelling in his feet, particularly in his toes, which becomes painful after walking. He uses ice slippers and a TENS device to try to reduce the swelling and stimulate circulation, but these measures have not been very effective.  No trouble with breathing or chest pain, but he reports experiencing a lot of fatigue and a lack of energy.   Lab Results  Component Value Date   HGBA1C 6.2 (H) 05/03/2023   HGBA1C 5.9 (H) 11/23/2022   Wt Readings from Last 3 Encounters:  02/28/24 214 lb 14.4 oz (97.5 kg)  05/03/23 210 lb 4.8 oz (95.4 kg)  11/23/22 203 lb (92.1 kg)       Medications: Outpatient Medications Prior to Visit  Medication Sig   amLODipine  (NORVASC ) 5 MG tablet TAKE 1 TABLET BY MOUTH DAILY   atorvastatin  (LIPITOR) 10 MG tablet TAKE 1 TABLET BY  MOUTH EVERY EVENING   Cholecalciferol (VITAMIN D3) 50 MCG (2000 UT) capsule Take 2,000 Units by mouth daily.   clobetasol ointment (TEMOVATE) 0.05 % Apply twice daily to active areas on your elbows until smooth then stop.   folic acid (FOLVITE) 1 MG tablet Take 1 mg by mouth 2 (two) times daily.   gabapentin  (NEURONTIN ) 300 MG capsule TAKE ONE CAPSULE BY MOUTH TWICE A DAY   methotrexate 250 MG/10ML injection Inject into the skin.   sildenafil  (VIAGRA ) 50 MG tablet Take 1-2 tablets (50-100 mg total) by mouth daily as needed for erectile dysfunction.   tacrolimus (PROTOPIC) 0.1 % ointment Apply topically.   tadalafil  (CIALIS ) 10 MG tablet TAKE 1 TABLET BY MOUTH DAILY   triamcinolone cream (KENALOG) 0.1 %    TUBERCULIN SYR 1CC/27GX1/2 27G X 1/2 1 ML MISC Inject into the skin.   zolpidem  (AMBIEN ) 10 MG tablet Take 1.5 tablets (15 mg total) by mouth at bedtime as needed for sleep.   [DISCONTINUED] levothyroxine  (SYNTHROID ) 125 MCG tablet TAKE 1 TABLET BY MOUTH DAILY   [DISCONTINUED] metoprolol  tartrate (LOPRESSOR ) 50 MG tablet TAKE 1 TABLET BY MOUTH TWICE A DAY   No facility-administered medications prior to visit.        Objective    BP 131/65 (BP Location: Right Arm, Patient Position: Sitting, Cuff Size:  Normal)   Pulse (!) 52   Ht 5' 11 (1.803 m)   Wt 214 lb 14.4 oz (97.5 kg)   SpO2 98%   BMI 29.97 kg/m   Physical Exam   General: Appearance:    Well developed, well nourished male in no acute distress  Eyes:    PERRL, conjunctiva/corneas clear, EOM's intact       Lungs:     Clear to auscultation bilaterally, respirations unlabored  Heart:    Bradycardic. Normal rhythm. No murmurs, rubs, or gallops.    MS:   All extremities are intact.  Trace pedal and ankle edema.   Neurologic:   Awake, alert, oriented x 3. No apparent focal neurological defect.         Assessment & Plan     1. Essential hypertension (Primary) Well controlled.  Continue current medications.   - CBC -  Comprehensive metabolic panel with GFR  2. Type 2 diabetes mellitus without complication, without long-term current use of insulin  (HCC) Doing well on current medications - Hemoglobin A1c - Lipid panel  3. Neuropathy Continue gabapentin   4. Hypothyroidism, unspecified type  - T4 AND TSH  5. Urinary frequency Normal u/a, likely some mild BPH. Checking PSA as below.   6. Edema, unspecified type Mild, chronic.   7. Vitamin D  deficiency  - VITAMIN D  25 Hydroxy (Vit-D Deficiency, Fractures)  8. Prostate cancer screening  - PSA Total (Reflex To Free)    9. RESTLESS LEGS SYNDROME Continue gabapentin   10. Psoriatic arthritis (HCC) Stable on methotrexate. Continue regular follow up rheumatology and dermatology.        Nancyann Perry, MD  [facility] [phone] (phone) [phone] (fax)  [facility]

## 2024-03-23 ENCOUNTER — Other Ambulatory Visit: Payer: Self-pay | Admitting: Family Medicine

## 2024-03-23 DIAGNOSIS — I10 Essential (primary) hypertension: Secondary | ICD-10-CM

## 2024-04-05 ENCOUNTER — Other Ambulatory Visit: Payer: Self-pay | Admitting: Family Medicine

## 2024-04-05 DIAGNOSIS — G4700 Insomnia, unspecified: Secondary | ICD-10-CM

## 2024-04-26 ENCOUNTER — Ambulatory Visit: Payer: Self-pay

## 2024-04-26 NOTE — Telephone Encounter (Signed)
 Appt made for 04/28/24 per dr. gasper

## 2024-04-26 NOTE — Telephone Encounter (Signed)
 FYI Only or Action Required?: Action required by provider: request for appointment.  Patient was last seen in primary care on 02/28/2024 by Keith Nancyann BRAVO, MD.  Called Nurse Triage reporting Hernia.  Symptoms began several weeks ago.  Interventions attempted: OTC medications: tylenol .  Symptoms are: gradually worsening.  Triage Disposition: See HCP Within 4 Hours (Or PCP Triage)  Patient/caregiver understands and will follow disposition?: No, refuses dispositionCopied from CRM #[id number]. Topic: Clinical - Red Word Triage >> Apr 26, 2024 11:09 AM Keith Carpenter wrote: Red Word that prompted transfer to Nurse Triage: Aggravated an old Hernia. Very painful. Pain level is currently a 7 and other times it is a 9. Coughing or sneezing aggravates it on his left side under rib cage going down towards the groin area Reason for Disposition  [1] Constant abdominal pain AND [2] present > 2 hours  (Note: NO pain or tenderness of hernia)  Answer Assessment - Initial Assessment Questions Pt was suppose to have hernia surgery years ago and decided to treat it differently. Symptoms have returned. Hernia was in groin but Pain is now in left rib care to left groin. Pt stated it feels very similar to years ago but is increasing in side/rib care area. Pt denies chest pain, SOB. Pt only wants to see PCP. Soonest appt 9/12. RN advised pt needs to be seen before that. Pt declined other providers/UC. Pt wants PCP to review this call and schedule appt. Please advise       1. ONSET:  When did this first appear?     2 weeks ago 2. APPEARANCE: What does it look like?     No appearance  3. SIZE: How big is it? (e.g., inches, cm; or compare to coins, fruit)     na 4. LOCATION: Where exactly is the hernia located?     Left rib care to groin 5. PATTERN: Does the swelling come and go, or has it been constant since it started?     na 6. PAIN: Is there any pain? If Yes, ask: How bad is it?  (Scale 0-10;  or none, mild, moderate, severe)     7 7. DIAGNOSIS: Have you been seen by a doctor (or NP/PA) for this? Did the doctor diagnose you as having a hernia?     Yes years ago 8. OTHER SYMPTOMS: Do you have any other symptoms? (e.g., fever, abdomen pain, vomiting) Denies all  Protocols used: Hernia-A-AH

## 2024-04-26 NOTE — Telephone Encounter (Signed)
 He can have the 2pm slot this  Friday the 15th.

## 2024-04-28 ENCOUNTER — Ambulatory Visit: Admitting: Family Medicine

## 2024-04-28 ENCOUNTER — Encounter: Payer: Self-pay | Admitting: Family Medicine

## 2024-04-28 VITALS — BP 139/77 | HR 57 | Ht 71.0 in | Wt 217.0 lb

## 2024-04-28 DIAGNOSIS — R1032 Left lower quadrant pain: Secondary | ICD-10-CM | POA: Diagnosis not present

## 2024-04-28 DIAGNOSIS — N451 Epididymitis: Secondary | ICD-10-CM

## 2024-04-28 DIAGNOSIS — N50812 Left testicular pain: Secondary | ICD-10-CM

## 2024-04-28 LAB — POCT URINALYSIS DIPSTICK
Glucose, UA: NEGATIVE
Ketones, UA: NEGATIVE
Leukocytes, UA: NEGATIVE
Nitrite, UA: NEGATIVE
Protein, UA: NEGATIVE — AB
Spec Grav, UA: >=1.03 — AB (ref 1.010–1.025)
Urobilinogen, UA: 0.2 U/dL — AB
pH, UA: 6 (ref 5.0–8.0)

## 2024-04-28 MED ORDER — LEVOFLOXACIN 750 MG PO TABS: 750.0000 mg | ORAL_TABLET | Freq: Every day | ORAL | 0 refills | Status: AC

## 2024-05-03 ENCOUNTER — Encounter: Payer: Self-pay | Admitting: Emergency Medicine

## 2024-05-03 ENCOUNTER — Other Ambulatory Visit: Payer: Self-pay

## 2024-05-03 ENCOUNTER — Emergency Department
Admission: EM | Admit: 2024-05-03 | Discharge: 2024-05-03 | Disposition: A | Discharge status: Home / Self Care | Source: Ambulatory Visit | Attending: Emergency Medicine | Admitting: Emergency Medicine

## 2024-05-03 ENCOUNTER — Emergency Department

## 2024-05-03 ENCOUNTER — Ambulatory Visit: Payer: Self-pay

## 2024-05-03 DIAGNOSIS — K5732 Diverticulitis of large intestine without perforation or abscess without bleeding: Secondary | ICD-10-CM | POA: Insufficient documentation

## 2024-05-03 DIAGNOSIS — I10 Essential (primary) hypertension: Secondary | ICD-10-CM | POA: Insufficient documentation

## 2024-05-03 DIAGNOSIS — K5792 Diverticulitis of intestine, part unspecified, without perforation or abscess without bleeding: Secondary | ICD-10-CM

## 2024-05-03 DIAGNOSIS — R1012 Left upper quadrant pain: Secondary | ICD-10-CM | POA: Diagnosis present

## 2024-05-03 LAB — URINALYSIS, ROUTINE W REFLEX MICROSCOPIC
Bacteria, UA: NONE SEEN
Bilirubin Urine: NEGATIVE
Glucose, UA: 50 mg/dL — AB
Ketones, ur: NEGATIVE mg/dL
Leukocytes,Ua: NEGATIVE
Nitrite: NEGATIVE
Protein, ur: NEGATIVE mg/dL
RBC / HPF: 0 RBC/hpf (ref 0–5)
Specific Gravity, Urine: 1.009 (ref 1.005–1.030)
Squamous Epithelial / HPF: 0 /HPF (ref 0–5)
pH: 6 (ref 5.0–8.0)

## 2024-05-03 LAB — COMPREHENSIVE METABOLIC PANEL WITH GFR
ALT: 25 U/L (ref 0–44)
AST: 26 U/L (ref 15–41)
Albumin: 4.1 g/dL (ref 3.5–5.0)
Alkaline Phosphatase: 64 U/L (ref 38–126)
Anion gap: 9 (ref 5–15)
BUN: 16 mg/dL (ref 8–23)
CO2: 26 mmol/L (ref 22–32)
Calcium: 8.6 mg/dL — ABNORMAL LOW (ref 8.9–10.3)
Chloride: 103 mmol/L (ref 98–111)
Creatinine, Ser: 1.11 mg/dL (ref 0.61–1.24)
GFR, Estimated: >60 mL/min (ref >60)
Glucose, Bld: 165 mg/dL — ABNORMAL HIGH (ref 70–99)
Potassium: 3.9 mmol/L (ref 3.5–5.1)
Sodium: 138 mmol/L (ref 135–145)
Total Bilirubin: 0.8 mg/dL (ref 0.0–1.2)
Total Protein: 7.6 g/dL (ref 6.5–8.1)

## 2024-05-03 LAB — CBC
HCT: 43.1 % (ref 39.0–52.0)
Hemoglobin: 15.3 g/dL (ref 13.0–17.0)
MCH: 31.5 pg (ref 26.0–34.0)
MCHC: 35.5 g/dL (ref 30.0–36.0)
MCV: 88.9 fL (ref 80.0–100.0)
Platelets: 275 K/uL (ref 150–400)
RBC: 4.85 MIL/uL (ref 4.22–5.81)
RDW: 12.7 % (ref 11.5–15.5)
WBC: 10.4 K/uL (ref 4.0–10.5)
nRBC: 0 % (ref 0.0–0.2)

## 2024-05-03 LAB — TROPONIN I (HIGH SENSITIVITY): Troponin I (High Sensitivity): 5 ng/L (ref <18)

## 2024-05-03 LAB — LIPASE, BLOOD: Lipase: 27 U/L (ref 11–51)

## 2024-05-03 MED ORDER — METRONIDAZOLE 500 MG PO TABS: 500.0000 mg | ORAL_TABLET | Freq: Three times a day (TID) | ORAL | 0 refills | Status: AC

## 2024-05-03 MED ORDER — CIPROFLOXACIN HCL 500 MG PO TABS: 500.0000 mg | ORAL_TABLET | Freq: Two times a day (BID) | ORAL | 0 refills | Status: AC

## 2024-05-03 MED ORDER — IOHEXOL 350 MG/ML SOLN
100.0000 mL | Freq: Once | INTRAVENOUS | Status: AC | PRN
  Administered 2024-05-03: 100 mL via INTRAVENOUS

## 2024-05-03 NOTE — Telephone Encounter (Signed)
 FYI Only or Action Required?: Action required by provider: pt refused ED: would like to set up ABD scan previously discussed with PCP.  Patient was last seen in primary care on 04/28/2024 by Keith Nancyann BRAVO, MD.  Called Nurse Triage reporting Abdominal Pain.  Symptoms began ongoing and worsening yesterday.  Interventions attempted: Prescription medications: ABX.  Symptoms are: gradually worsening.  Triage Disposition: No disposition on file.  Patient/caregiver understands and will follow disposition?:    Copied from CRM [id number]. Topic: Clinical - Red Word Triage >> May 03, 2024  8:04 AM [name] wrote: [name] that prompted transfer to Nurse Triage: Received call from patient, states he is having increased left side abdominal/stomach pain. Reason for Disposition  [1] SEVERE pain (e.g., excruciating) AND [2] present > 1 hour  Answer Assessment - Initial Assessment Questions 1. LOCATION: Where does it hurt?      Left side abd pain 2. RADIATION: Does the pain shoot anywhere else? (e.g., chest, back)     Sometimes pain goes down to left groin groin area 3. ONSET: When did the pain begin? (Minutes, hours or days ago)      Worsening x 2 days 4. SUDDEN: Gradual or sudden onset?     gradual 5. PATTERN Does the pain come and go, or is it constant?     Comes and goes: lying on back helps with pain 6. SEVERITY: How bad is the pain?  (e.g., Scale 1-10; mild, moderate, or severe)     9/10 7. RECURRENT SYMPTOM: Have you ever had this type of stomach pain before? If Yes, ask: When was the last time? and What happened that time?      no 8. CAUSE: What do you think is causing the stomach pain? (e.g., gallstones, recent abdominal surgery)     no 9. RELIEVING/AGGRAVATING FACTORS: What makes it better or worse? (e.g., antacids, bending or twisting motion, bowel movement)     no 10. OTHER SYMPTOMS: Do you have any other symptoms? (e.g., back pain, diarrhea, fever,  urination pain, vomiting)       Pt has back pain / arthritis, SOB with pain increase, left abd warm to touch.  Nurse recommended pt go to ED due to increased pain 9/10 w/ SOB yesterday& no SOB  today.  Pt does not want to go to the ED due to  his discussion with PCP regarding abd scan.   Dr. Gasper mentioned to pt about possibly getting an abd scan if pain increased &/or if the ABX did not work for pt.  Pt is calling today w/increased abd pain 9/10 & some SOB w/pain yesterday: none today: pt would like to attempt to set up scan ASAP.  Protocols used: Abdominal Pain - Male-A-AH

## 2024-05-03 NOTE — Discharge Instructions (Addendum)
 We discussed admission to the hospital for your abnormal CT imaging.  However after discussion with the surgical team they were not sure what was causing it but they do not feel like it needed immediate surgery and they were okay with trialing different antibiotics at home.  You have declined admission however if you develop fevers, worsening symptoms you should return to the ER for repeat CT imaging.  Otherwise the surgical team plans to follow-up with you on Tuesday.  Please call them to confirm an appointment.  The most important thing for diverticulitis is a liquid diet for a few days.  IMPRESSION: 1. Acute diverticulitis involving the descending colon without abscess. 2. Small to moderate amount of free peritoneal fluid in the inferior pelvis. 3. 4.3 cm ascending thoracic aortic aneurysm. Recommend annual imaging followup by CTA or MRA. This recommendation follows 2010 ACCF/AHA/AATS/ACR/ASA/SCA/SCAI/SIR/STS/SVM Guidelines for the Diagnosis and Management of Patients with Thoracic Aortic Disease. Circulation. 2010; 121: Z733-z630. Aortic aneurysm NOS (ICD10-I71.9) 4. No aortic dissection. 5. 2 x 1 cm oval collection of air posterior to the trachea on the right and adjacent to the upper esophagus. 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 This could also represent an air-filled bronchogenic cyst. 6. Moderately enlarged prostate gland protruding into the base of the urinary bladder. 7. Moderate-sized bilateral inguinal hernias containing fat and minimal umbilical hernia containing fat. 8. Surgically absent thyroid  gland with a 6 mm oval residual area of thyroid  tissue or parathyroid  gland on the left.

## 2024-05-03 NOTE — ED Notes (Signed)
 Spoke to Lockney in the lab, she will add on the Troponin.

## 2024-05-03 NOTE — Telephone Encounter (Signed)
 If symptoms are severe or worsening he will need to go to ER for evaluation and treatment. Otherwise I can place order for stat CT scan but we still may not  have results for a day or two.

## 2024-05-03 NOTE — ED Triage Notes (Signed)
 Patient to ED via POV for left side abd pain. Ongoing for a couple of weeks, worse since yesterday. Denies N/V/D. Hx hernia

## 2024-05-03 NOTE — ED Provider Notes (Signed)
 [facility] Provider Note    Event Date/Time   First MD Initiated Contact with Patient 05/03/24 1010     (approximate)   History   Abdominal Pain   HPI  Keith Carpenter is a 68 y.o. male with history of hypertension, hyperlipidemia who comes in with concerns for abdominal pain.  His outpatient doctor was planning to order a CT scan but it would have to take 1 to 2 days if ordered by the primary doctor so he recommended he come in to the ER for evaluation due to worsening pain  I reviewed the notes where patient was seen on 8/15 and treated for epididymitis with levofloxacin   I reviewed the records were back in 2019 patient had an ultrasound that was reassuring of his gallbladder and CT imaging that showed prior diverticulitis, bilateral inguinal hernias  Patient really denies any pain in his testicles.  Reports that this pain is more in his left upper to mid abdomen.  He states this does not feel similar to his prior diverticulitis his.  He denies any significant chest pain.  He report having a little shortness of breath yesterday but that was more when he was just hit with the pain.  He denies any current shortness of breath, history of blood clots, symptoms of DVT.  No pain with taking a deep breath.  He does report history of thoracic aneurysm that is being monitored.   Physical Exam   Triage Vital Signs: ED Triage Vitals  Encounter Vitals Group     BP 05/03/24 0927 (!) 142/82     Girls Systolic BP Percentile --      Girls Diastolic BP Percentile --      Boys Systolic BP Percentile --      Boys Diastolic BP Percentile --      Pulse Rate 05/03/24 0927 92     Resp 05/03/24 0927 18     Temp 05/03/24 0927 99.5 F (37.5 C)     Temp Source 05/03/24 0927 Oral     SpO2 05/03/24 0927 96 %     Weight 05/03/24 0925 215 lb (97.5 kg)     Height 05/03/24 0925 5' 11 (1.803 m)     Head Circumference --      Peak Flow --      Pain Score 05/03/24 0925 9      Pain Loc --      Pain Education --      Exclude from Growth Chart --     Most recent vital signs: Vitals:   05/03/24 0927  BP: (!) 142/82  Pulse: 92  Resp: 18  Temp: 99.5 F (37.5 C)  SpO2: 96%     General: Awake, no distress.  CV:  Good peripheral perfusion.  Resp:  Normal effort.  Clear lung Abd:  No distention.  Tender in the left upper to mid abdomen. Other:  No swelling in legs.  No calf tenderness   ED Results / Procedures / Treatments   Labs (all labs ordered are listed, but only abnormal results are displayed) Labs Reviewed  COMPREHENSIVE METABOLIC PANEL WITH GFR - Abnormal; Notable for the following components:      Result Value   Glucose, Bld 165 (*)    Calcium  8.6 (*)    All other components within normal limits  URINALYSIS, ROUTINE W REFLEX MICROSCOPIC - Abnormal; Notable for the following components:   Color, Urine STRAW (*)    APPearance CLEAR (*)  Glucose, UA 50 (*)    Hgb urine dipstick SMALL (*)    All other components within normal limits  LIPASE, BLOOD  CBC     EKG  My interpretation of EKG:  Normal sinus without any ST elevation, T wave version lead III, normal intervals.  Patient's had prior T wave inversion in lead III in 2018  RADIOLOGY I have reviewed the xray personally and interpreted no kidney stones   PROCEDURES:  Critical Care performed: No  Procedures   MEDICATIONS ORDERED IN ED: Medications  iohexol  (OMNIPAQUE ) 350 MG/ML injection 100 mL (100 mLs Intravenous Contrast Given 05/03/24 1114)     IMPRESSION / MDM / ASSESSMENT AND PLAN / ED COURSE  I reviewed the triage vital signs and the nursing notes.   Patient's presentation is most consistent with acute presentation with potential threat to life or bodily function.   Differential is aneurysm leak, dissection, celiac artery occlusion, pancreatitis, diverticulitis.  Patient is driving so declined pain medications will proceed with CT imaging.  Troponin was  negative and symptoms been ongoing for a few days now so doubt ACS.  Lipase was normal CMP was reassuring.  CBC reassuring  IMPRESSION: 1. Acute diverticulitis involving the descending colon without abscess. 2. Small to moderate amount of free peritoneal fluid in the inferior pelvis. 3. 4.3 cm ascending thoracic aortic aneurysm. Recommend annual imaging followup by CTA or MRA. This recommendation follows 2010 ACCF/AHA/AATS/ACR/ASA/SCA/SCAI/SIR/STS/SVM Guidelines for the Diagnosis and Management of Patients with Thoracic Aortic Disease. Circulation. 2010; 121: [id number]. Aortic aneurysm NOS (ICD10-I71.9) 4. No aortic dissection. 5. 2 x 1 cm oval collection of air posterior to the trachea on the right and adjacent to the upper esophagus. 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 This could also represent an air-filled bronchogenic cyst. 6. Moderately enlarged prostate gland protruding into the base of the urinary bladder. 7. Moderate-sized bilateral inguinal hernias containing fat and minimal umbilical hernia containing fat. 8. Surgically absent thyroid  gland with a 6 mm oval residual area of thyroid  tissue or parathyroid  gland on the left.  Given the concern for small to moderate free peritoneal fluid I did discuss the case with the surgeon Dr. Lane on-call.  He reviewed CT imaging I was not entirely sure what was causing this but did not see any signs for abscess, perforation.  Felt that it could just be reactive.  From his standpoint he can be followed up in the clinic on Tuesday.  He states that he he suspects that this is related to failed antibiotics not giving good anaerobic coverage and recommended switching him to Cipro , Flagyl  and have a clear liquid diet for a few days.    Discussed with patient the CT scan as above we discussed the amount of fluid which is little bit odd and I did discuss with admission just for IV antibiotics and  monitoring to ensure no worsening abdominal pain however his white count is stable and his hemoglobin is stable and this has been ongoing for a few days now.  Patient declines admission and prefer outpatient trial of different antibiotics.  I did offer to do testicle exam but patient really denies any testicle pain.  He states his pain is just in his abdomen.  He understands that if symptoms are worsening develops any fevers, worsening pain that he needs to return to the ER for repeat evaluation given this abnormal finding.  Otherwise surgical team does plan to follow-up with him on Tuesday.  Patient  does prefer this option.   The patient is on the cardiac monitor to evaluate for evidence of arrhythmia and/or significant heart rate changes.      FINAL CLINICAL IMPRESSION(S) / ED DIAGNOSES   Final diagnoses:  Diverticulitis     Rx / DC Orders   ED Discharge Orders          Ordered    ciprofloxacin  (CIPRO ) 500 MG tablet  2 times daily        05/03/24 1227    metroNIDAZOLE  (FLAGYL ) 500 MG tablet  3 times daily        05/03/24 1227             Note:  This document was prepared using Dragon voice recognition software and may include unintentional dictation errors.   Ernest Ronal BRAVO, MD 05/03/24 1228

## 2024-05-03 NOTE — Telephone Encounter (Signed)
 Called [facility] and spoke with Niels: informed her that pt refused ED recommendation by nurse: pt would like to come into office to do abd scan that he discussed previously with PCP.  Please call pt.

## 2024-05-10 NOTE — Progress Notes (Unsigned)
 Patient ID: Keith Carpenter, male   DOB: Dec 15, 1955, 68 y.o.   MRN: [id number]  Chief Complaint: Follow-up from ED visit for diverticulitis  History of Present Illness Keith Carpenter is a 68 y.o. male with recent ED visit 1 week ago for diverticulitis, primarily left-sided pain.  Also noted on CT to have a small amount of pelvic fluid.  Reports his pain on presentation was 9 out of 10, located to the left flank which was different from prior episodes in 2018 and 2023.  He refused admission, and elective to go home on oral antibiotics and a brat diet.  He reports his discomfort is currently a 2 out of 10 with significant resolution.  He has nearly completed his antibiotics, would like to advance his diet.  He has never been on a fiber supplement, but eats oatmeal typically twice a week.  He takes IBgard occasionally for abdominal discomfort/indigestion/bloating.  Typical bowel habits have been once daily.  He denies any nausea, vomiting, fevers or chills.  Denies any hematochezia or bright red blood per rectum.  He has reviewed his history of Guillain-Barr and its association with prior surgeries.  Last colonoscopy was 5 years ago with Dr. Therisa.  He was told he was good for 10 years.  Considering his new symptoms, we discussed the possibility of follow-up colonoscopy prior to considering any elective surgery.  However considering all of his comorbidities, I believe he is quite reluctant to consider elective surgery as he has had such great improvement of his symptoms with antibiotics and brat diet management alone.  This being the case, colonoscopy at this time may be superfluous.  He has occasional left testicular pain that is an ache or discomfort, not typically associate with activity.  He does not know of any exacerbating or relieving factor.  Does not have any persisting bulge in either groin, but has a known bilateral inguinal hernias.  Past Medical History Past Medical History:   Diagnosis Date   Collagen vascular disease (HCC)    RA diagnosed in his 33's   Diabetes mellitus without complication (HCC)    Gastric ulcer    H/O Guillain-Barre syndrome    Helicobacter pylori gastritis treated 07/2017   Hypertension    Hypocalcemia    Panic disorder    Schamberg disease    Thoracic aortic aneurysm (HCC) 04/2014   4.4 cm followed at [facility]   Thyroid  cancer [facility]) 2009      Past Surgical History:  Procedure Laterality Date   CARDIAC CATHETERIZATION  06/07/2014   [facility]   COLONOSCOPY     COLONOSCOPY WITH PROPOFOL  N/A 03/31/2019   Procedure: COLONOSCOPY WITH PROPOFOL ;  Surgeon: Therisa Bi, MD;  Location: [facility] ENDOSCOPY;  Service: Gastroenterology;  Laterality: N/A;   ESOPHAGOGASTRODUODENOSCOPY (EGD) WITH PROPOFOL  N/A 03/31/2019   Procedure: ESOPHAGOGASTRODUODENOSCOPY (EGD) WITH PROPOFOL ;  Surgeon: Therisa Bi, MD;  Location: [facility] ENDOSCOPY;  Service: Gastroenterology;  Laterality: N/A;   MENISCUS REPAIR Right    THYROIDECTOMY  2009    Allergies  Allergen Reactions   Influenza Vaccines Other (See Comments)    Guillain Barre   Ixekizumab Hives    Rashes, swollen eyes, heat in facial region   Pregabalin Itching, Rash and Swelling   Declomycin [Demeclocycline]    Demeclocycline Hcl    Glipizide     Nausea    Metformin And Related Nausea Only    Metformin IR 500 BID   Other     Other reaction(s): Other (See Comments) Uncoded Allergy. Allergen:  Other Allergy: See Patient Chart for Details, Other Reaction: Other reaction   Prednisone      Patient was told not to take for extended periods of time due to personal history of aortic aneurysm Other reaction(s): Other (See Comments) Personal history of aortic aneurysm Personal history of aortic aneurysm   Remicade [Infliximab]    Suvorexant Other (See Comments)    vision changes   Clarithromycin  Rash   Pioglitazone  Itching, Swelling and Rash    Current Outpatient Medications  Medication Sig Dispense Refill    amLODipine  (NORVASC ) 5 MG tablet TAKE 1 TABLET BY MOUTH DAILY 90 tablet 3   atorvastatin  (LIPITOR) 10 MG tablet TAKE 1 TABLET BY MOUTH EVERY EVENING 90 tablet 4   Cholecalciferol (VITAMIN D3) 50 MCG (2000 UT) capsule Take 2,000 Units by mouth daily.     ciprofloxacin  (CIPRO ) 500 MG tablet Take 1 tablet (500 mg total) by mouth 2 (two) times daily for 10 days. 20 tablet 0   clobetasol ointment (TEMOVATE) 0.05 % Apply twice daily to active areas on your elbows until smooth then stop.     folic acid (FOLVITE) 1 MG tablet Take 1 mg by mouth 2 (two) times daily.     gabapentin  (NEURONTIN ) 300 MG capsule TAKE ONE CAPSULE BY MOUTH TWICE A DAY 60 capsule 12   levothyroxine  (SYNTHROID ) 125 MCG tablet TAKE 1 TABLET BY MOUTH DAILY 90 tablet 0   methotrexate 250 MG/10ML injection Inject into the skin.     metoprolol  tartrate (LOPRESSOR ) 50 MG tablet TAKE 1 TABLET BY MOUTH TWICE A DAY 180 tablet 1   sildenafil  (VIAGRA ) 50 MG tablet Take 1-2 tablets (50-100 mg total) by mouth daily as needed for erectile dysfunction. 30 tablet 2   tacrolimus (PROTOPIC) 0.1 % ointment Apply topically.     tadalafil  (CIALIS ) 10 MG tablet TAKE 1 TABLET BY MOUTH DAILY 30 tablet 2   triamcinolone cream (KENALOG) 0.1 %   2   TUBERCULIN SYR 1CC/27GX1/2 27G X 1/2 1 ML MISC Inject into the skin.     zolpidem  (AMBIEN ) 10 MG tablet TAKE 1 AND 1/2 TABLETS BY MOUTH AT BEDTIME AS NEEDED FOR SLEEP 45 tablet 3   No current facility-administered medications for this visit.    Family History Family History  Problem Relation Age of Onset   Hyperlipidemia Mother    Heart attack Father 76   Hypertension Father    Hyperlipidemia Father    Heart failure Father    Prostate cancer Neg Hx    Kidney cancer Neg Hx    Bladder Cancer Neg Hx    Kidney disease Neg Hx       Social History Social History   Tobacco Use   Smoking status: Never   Smokeless tobacco: Never  Vaping Use   Vaping status: Never Used  Substance Use Topics    Alcohol use: No   Drug use: No        Review of Systems  Constitutional:  Negative for chills and fever.  HENT: Negative.    Eyes: Negative.   Respiratory: Negative.    Cardiovascular: Negative.   Gastrointestinal:  Positive for abdominal pain. Negative for blood in stool, constipation, diarrhea, heartburn, melena, nausea and vomiting.  Genitourinary:  Positive for frequency. Negative for dysuria, flank pain, hematuria and urgency.  Skin: Negative.   Neurological:  Positive for dizziness.  Psychiatric/Behavioral: Negative.       Physical Exam Blood pressure 127/71, pulse (!) 56, temperature 98.7 F (37.1 C), temperature source  Oral, height 5' 11 (1.803 m), weight 210 lb 3.2 oz (95.3 kg), SpO2 95%. Last Weight  Most recent update: 05/11/2024 10:30 AM    Weight  95.3 kg (210 lb 3.2 oz)             CONSTITUTIONAL: Well developed, and nourished, appropriately responsive and aware without distress.   EYES: Sclera non-icteric.   EARS, NOSE, MOUTH AND THROAT:  The oropharynx is clear. Oral mucosa is pink and moist.    Hearing is intact to voice.  NECK: Trachea is midline, and there is no jugular venous distension.  LYMPH NODES:  Lymph nodes in the neck are not appreciated. RESPIRATORY:   Normal respiratory effort without pathologic use of accessory muscles. CARDIOVASCULAR:  Well perfused.  GI: The abdomen is  soft, nontender, and nondistended. There were no palpable masses.  I did not appreciate hepatosplenomegaly.  Focal left-sided subjective tenderness, not appreciated when distracted. GU: Bilateral inguinal hernias, left greater than right.  Noted on Valsalva, readily reducible. MUSCULOSKELETAL:  Symmetrical muscle tone appreciated in all four extremities.    SKIN: Skin turgor is normal. No pathologic skin lesions appreciated.  NEUROLOGIC:  Motor and sensation appear grossly normal.  Cranial nerves are grossly without defect. PSYCH:  Alert and oriented to person, place and  time. Affect is appropriate for situation.  Data Reviewed I have personally reviewed what is currently available of the patient's imaging, recent labs and medical records.   Labs:     Latest Ref Rng & Units 05/03/2024    9:28 AM 02/28/2024   12:00 AM 11/23/2022    4:23 PM  CBC  WBC 4.0 - 10.5 K/uL 10.4  6.7  7.3   Hemoglobin 13.0 - 17.0 g/dL 84.6  85.2  84.5   Hematocrit 39.0 - 52.0 % 43.1  44.5  45.3   Platelets 150 - 400 K/uL 275  252  259       Latest Ref Rng & Units 05/03/2024    9:28 AM 02/28/2024   12:00 AM 11/23/2022    4:23 PM  CMP  Glucose 70 - 99 mg/dL 834  885  96   BUN 8 - 23 mg/dL 16  13  16    Creatinine 0.61 - 1.24 mg/dL 8.88  8.94  8.92   Sodium 135 - 145 mmol/L 138  140  140   Potassium 3.5 - 5.1 mmol/L 3.9  5.2  5.0   Chloride 98 - 111 mmol/L 103  102  100   CO2 22 - 32 mmol/L 26  23  26    Calcium  8.9 - 10.3 mg/dL 8.6  8.7  9.3   Total Protein 6.5 - 8.1 g/dL 7.6  6.7  7.3   Total Bilirubin 0.0 - 1.2 mg/dL 0.8  0.5  0.5   Alkaline Phos 38 - 126 U/L 64  86  93   AST 15 - 41 U/L 26  21  20    ALT 0 - 44 U/L 25  25  23      Imaging: Radiological images personally reviewed:  CLINICAL DATA:  Left abdominal pain for the past 2 weeks, worse since yesterday. Clinical concern for acute aortic syndrome.   EXAM: CT ANGIOGRAPHY CHEST, ABDOMEN AND PELVIS   TECHNIQUE: Initial pre contrast images were obtained through the chest and upper abdomen.   Multidetector CT imaging through the chest, abdomen and pelvis was performed using the standard protocol during bolus administration of intravenous contrast. Multiplanar reconstructed images and MIPs were obtained and reviewed  to evaluate the vascular anatomy.   RADIATION DOSE REDUCTION: This exam was performed according to the departmental dose-optimization program which includes automated exposure control, adjustment of the mA and/or kV according to patient size and/or use of iterative reconstruction technique.    CONTRAST:  OMNIPAQUE  IOHEXOL  350 MG/ML SOLN   COMPARISON:  Abdomen and pelvis CT dated 10/19/2017 and 08/25/2017. Chest radiographs dated 08/26/2017 and 08/24/2017.   FINDINGS: CTA CHEST FINDINGS   Cardiovascular: Minimal atheromatous aortic and coronary artery calcifications. Normal sized heart. No pericardial effusion. Mildly dilated ascending thoracic aorta with a diameter of 4.3 cm. No aortic dissection demonstrated.   Mediastinum/Nodes: Surgically absent thyroid  gland. 6 mm oval residual area thyroid  tissue or parathyroid  gland on the left. No enlarged lymph nodes. Unremarkable trachea and esophagus. There is a 1 cm oval collection of air posterior to the trachea on the right and adjacent to the upper esophagus on image number 22/7. On sagittal image number 100/9, this measures 2 cm in length and contains a thin internal septation.   Lungs/Pleura: Subsegmental and linear atelectasis/scarring in the left lower lobe and mild linear atelectasis or scarring in the lingula.   Musculoskeletal: Thoracic and lower cervical spine degenerative changes with ossification of the anterior lodged on ligament in the mid to lower thoracic spine.   Review of the MIP images confirms the above findings.   CTA ABDOMEN AND PELVIS FINDINGS   VASCULAR   Aorta: Normal caliber aorta without aneurysm, dissection, vasculitis or significant stenosis.   Celiac: Patent without evidence of aneurysm, dissection, vasculitis or significant stenosis.   SMA: Patent without evidence of aneurysm, dissection, vasculitis or significant stenosis.   Renals: Both renal arteries are patent without evidence of aneurysm, dissection, vasculitis, fibromuscular dysplasia or significant stenosis.   IMA: Patent without evidence of aneurysm, dissection, vasculitis or significant stenosis.   Inflow: Patent without evidence of aneurysm, dissection, vasculitis or significant stenosis.   Veins: No obvious  venous abnormality within the limitations of this arterial phase study.   Review of the MIP images confirms the above findings.   NON-VASCULAR   Hepatobiliary: No focal liver abnormality is seen. No gallstones, gallbladder wall thickening, or biliary dilatation.   Pancreas: Unremarkable. No pancreatic ductal dilatation or surrounding inflammatory changes.   Spleen: Normal in size without focal abnormality.   Adrenals/Urinary Tract: Adrenal glands are unremarkable. Kidneys are normal, without renal calculi, focal lesion, or hydronephrosis. Bladder is unremarkable.   Stomach/Bowel: Eccentric wall thickening and pericolonic soft tissue stranding involving the descending colon. This is more proximal in the previously demonstrated changes of diverticulitis in the region of the distal descending colon/proximal sigmoid colon. Mild diverticulosis more distally today. No extraluminal gas or fluid collection.   Unremarkable stomach, small bowel and appendix.   Lymphatic: No enlarged lymph nodes.   Reproductive: Moderately enlarged prostate gland protruding into the base of the urinary bladder.   Other: Small to moderate amount of free peritoneal fluid in the inferior pelvis. Moderate-sized bilateral inguinal hernias containing fat and minimal umbilical hernia containing fat.   Musculoskeletal: Lumbar spine degenerative changes.   Review of the MIP images confirms the above findings.   IMPRESSION: 1. Acute diverticulitis involving the descending colon without abscess. 2. Small to moderate amount of free peritoneal fluid in the inferior pelvis. 3. 4.3 cm ascending thoracic aortic aneurysm. Recommend annual imaging followup by CTA or MRA. This recommendation follows 2010 ACCF/AHA/AATS/ACR/ASA/SCA/SCAI/SIR/STS/SVM Guidelines for the Diagnosis and Management of Patients with Thoracic Aortic Disease. Circulation. 2010; 121:  [phone]. Aortic aneurysm NOS (ICD10-I71.9) 4. No aortic  dissection. 5. 2 x 1 cm oval collection of air posterior to the trachea on the right and adjacent to the upper esophagus. 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 This could also represent an air-filled bronchogenic cyst. 6. Moderately enlarged prostate gland protruding into the base of the urinary bladder. 7. Moderate-sized bilateral inguinal hernias containing fat and minimal umbilical hernia containing fat. 8. Surgically absent thyroid  gland with a 6 mm oval residual area of thyroid  tissue or parathyroid  gland on the left.     Electronically Signed   By: Elspeth Bathe M.D.   On: 05/03/2024 11:47 Within last 24 hrs: No results found.  Assessment     Patient Active Problem List   Diagnosis Date Noted   Diverticulitis of colon 05/11/2024   Erectile dysfunction 11/15/2019   History of adenomatous polyp of colon 04/03/2019   AV block, 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   History of MRSA infection 04/07/2018   Non-recurrent bilateral inguinal hernia without obstruction or gangrene 12/22/2017   Psoriasis (a type of skin inflammation) 12/06/2017   Chronic use of opiate drug for therapeutic purpose 12/06/2017   Lumbar herniated disc 11/16/2017   Neuropathy 01/22/2017   History of hypoparathyroidism 01/06/2016   Hypothyroidism 08/12/2015   Calcium  deficiency disease 07/12/2015   Neurocardiogenic syncope 07/12/2015   Clinical depression 05/17/2015   Psoriatic arthritis (HCC) 03/12/2015   Thoracic ascending aortic aneurysm (HCC)    Essential hypertension    Vitamin D  deficiency 07/15/2009   Episodic paroxysmal anxiety disorder 02/22/2008   RESTLESS LEGS SYNDROME 08/31/2007   Type 2 diabetes mellitus (HCC) 08/30/2007   Hyperlipemia 08/30/2007   History of Guillain-Barre syndrome due to influenza immunization 08/30/2007   Insomnia 08/17/2007   History of gastric ulcer 11/26/2006    Plan    We discussed the elective nature of proceeding with surgery for  his diverticulitis, which would clearly not be combined with an elective operation for inguinal hernias.  Considering the benefit versus the significant risk involved, we would not venture to schedule or entertain pursuing either of these approaches.  He is well aware of the risks of involved, but also well aware of the risks associated with anesthesia and an operation considering his other comorbidities.  I believe he is of sound mind and is making wise decisions.  And I agree with him for deferment.  Will gladly see him back to this office as needed in the days to come, advised consultation with Dr. Therisa regarding follow-up for colonoscopy.    I personally spent a total of 65 minutes in the care of the patient today including preparing to see the patient, getting/reviewing separately obtained history, performing a medically appropriate exam/evaluation, counseling and educating, and documenting clinical information in the EHR.   These notes generated with voice recognition software. I apologize for typographical errors.  Honor Leghorn M.D., FACS 05/11/2024, 12:09 PM

## 2024-05-11 ENCOUNTER — Ambulatory Visit: Admitting: Surgery

## 2024-05-11 ENCOUNTER — Encounter: Payer: Self-pay | Admitting: Surgery

## 2024-05-11 VITALS — BP 127/71 | HR 56 | Temp 98.7°F | Ht 71.0 in | Wt 210.2 lb

## 2024-05-11 DIAGNOSIS — K4020 Bilateral inguinal hernia, without obstruction or gangrene, not specified as recurrent: Secondary | ICD-10-CM | POA: Diagnosis not present

## 2024-05-11 DIAGNOSIS — K5732 Diverticulitis of large intestine without perforation or abscess without bleeding: Secondary | ICD-10-CM | POA: Diagnosis not present

## 2024-05-11 NOTE — Patient Instructions (Addendum)
 Advised to pursue a goal of 25 to 30 g of fiber daily.  The majority of this may be through natural sources, advised to ensure a minimal daily fiber supplementation.  Various forms of supplements discussed.  Recommend Psyllium husk, with options of mixing with beverage or applesauce to make more tolerable. Strongly advised to consume more fluids(especially in proximity to fiber intake) and to ensure adequate hydration.   Watch color of urine to determine adequacy of hydration.  Clarity is pursued in urine output, and bowel activity that responds and corresponds to significant meal intake.   We need to avoid deferring having bowel movements, advised to take the time at the first sign of sensation, typically following meals, and in the morning.  The need to avoid more frequently, and the presence of flatus may indicate the need for bowel movement.  Do not defer for later.  Addition of MiraLAX (or its generic equivalent) may be needed ensure at least twice daily bowel movements.  If multiple doses of MiraLAX are necessary utilize them. Never skip a day... Do not tolerate a day without a bowel movement unless you are fasting.  To be regular, we must do the above EVERY day.   Soluble Fiber Dissolves in Water: Soluble fiber dissolves in water to form a gel-like substance. Slows Digestion: This type of fiber slows down digestion, which can help control blood sugar levels and lower cholesterol. Sources: Common sources include oats, beans, apples, citrus fruits, and psyllium. Benefits: Helps manage cholesterol levels. Aids in blood sugar control. Increases healthy gut bacteria, which can lower inflammation and improve digestion.  Insoluble Fiber Does Not Dissolve in Water: Insoluble fiber does not dissolve in water and remains mostly intact as it passes through the digestive system. Adds Bulk to Stool: It adds bulk to stool, which helps promote regular bowel movements and prevent constipation. Sources:  Common sources include whole grains, nuts, beans, and vegetables like cauliflower and potatoes. Benefits: Improves bowel health and regularity. Reduces the risk of colorectal conditions like hemorrhoids and diverticulitis. Supports insulin sensitivity in people with diabetes.  Both types of fiber are essential for overall health, and it's beneficial to include a 50/50 mix of both in your diet.    Diverticulitis  Diverticulitis is when small pouches in your colon get infected or swollen. This causes pain in your belly (abdomen) and watery poop (diarrhea). The small pouches are called diverticula. They may form if you have a condition called diverticulosis. What are the causes? You may get this condition if poop (stool) gets trapped in the pouches in your colon. The poop lets germs (bacteria) grow. This causes an infection. What increases the risk? You are more likely to get this condition if you have small pouches in your colon. You are also more likely to get it if: You are overweight or very overweight (obese). You do not exercise enough. You drink alcohol. You smoke. You eat a lot of red meat, like beef, pork, or lamb. You do not eat enough fiber. You are older than 66 years of age. What are the signs or symptoms? Pain in your belly. Pain is often on the left side, but it may be felt in other spots too. Fever and chills. Feeling like you may vomit. Vomiting. Having cramps. Feeling full. Changes in how often you poop. Blood in your poop. How is this treated? Most cases are treated at home. You may be told to: Take over-the-counter pain medicines. Only eat and drink clear liquids.  Take antibiotics. Rest. Very bad cases may need to be treated at a hospital. Treatment may include: Not eating or drinking. Taking pain medicines. Getting antibiotics through an IV tube. Getting fluid and food through an IV tube. Having surgery. When you are feeling better, you may need to have  a test to look at your colon (colonoscopy). Follow these instructions at home: Medicines Take over-the-counter and prescription medicines only as told by your doctor. These include: Fiber pills. Probiotics. Medicines to make your poop soft (stool softeners). If you were prescribed antibiotics, take them as told by your doctor. Do not stop taking them even if you start to feel better. Ask your doctor if you should avoid driving or using machines while you are taking your medicine. Eating and drinking  Follow the diet told by your doctor. You may need to only eat and drink liquids. When you feel better, you may be able to eat more foods. You may also be told to eat a lot of fiber. Fiber helps you poop. Foods with fiber include berries, beans, lentils, and green vegetables. Try not to eat red meat. General instructions Do not smoke or use any products that contain nicotine or tobacco. If you need help quitting, ask your doctor. Exercise 3 or more times a week. Try to go for 30 minutes each time. Exercise enough to sweat and make your heart beat faster. Contact a doctor if: Your pain gets worse. You are not pooping like normal. Your symptoms do not get better. Your symptoms get worse very fast. You have a fever. You vomit more than one time. You have poop that is: Bloody. Black. Tarry. This information is not intended to replace advice given to you by your health care provider. Make sure you discuss any questions you have with your health care provider. Document Revised: 05/28/2022 Document Reviewed: 05/28/2022 Elsevier Patient Education  2024 [employer].

## 2024-05-12 ENCOUNTER — Telehealth: Payer: Self-pay

## 2024-05-12 NOTE — Telephone Encounter (Signed)
 Faxed referral to Dr. Ruel Kung at [phone].

## 2024-05-13 NOTE — Progress Notes (Signed)
 Established patient visit   Patient: Keith Carpenter   DOB: 10/14/1955   68 y.o. Male  MRN: [id number] Visit Date: 04/28/2024  Today's healthcare provider: Nancyann Perry, MD   Chief Complaint  Patient presents with   Abdominal Pain    More so on the lower L side that shoots to the groin area this has been going on for about 2 weeks, he has also experienced testicle pain on the L as well    Subjective    Discussed the use of AI scribe software for clinical note transcription with the patient, who gave verbal consent to proceed.  History of Present Illness   Keith Carpenter is a 68 year old male who presents with left-sided groin and stomach pain.  He has been experiencing sharp pain in the left groin and stomach area for a couple of weeks. Initially, the pain was severe, rated at 8 to 9 out of 10, for three to four days but has since decreased in intensity. The pain radiates downward and is exacerbated by coughing, sneezing, and standing up. He has attempted to alleviate the pain by stretching, using heating pads, and walking more, but these measures have provided limited relief.  He also experiences intermittent pain in the left testicle area, which comes and goes. He has a history of a left-sided hernia that was not operated on.  He recalls a recent incident where a foster dog pulled on a leash, resulting in a fracture of his finger. He is unsure if this incident is related to his current pain, as the pain began a couple of weeks after the incident.       Medications: Outpatient Medications Prior to Visit  Medication Sig   amLODipine  (NORVASC ) 5 MG tablet TAKE 1 TABLET BY MOUTH DAILY   atorvastatin  (LIPITOR) 10 MG tablet TAKE 1 TABLET BY MOUTH EVERY EVENING   Cholecalciferol (VITAMIN D3) 50 MCG (2000 UT) capsule Take 2,000 Units by mouth daily.   clobetasol ointment (TEMOVATE) 0.05 % Apply twice daily to active areas on your elbows until smooth then stop.    folic acid (FOLVITE) 1 MG tablet Take 1 mg by mouth 2 (two) times daily.   gabapentin  (NEURONTIN ) 300 MG capsule TAKE ONE CAPSULE BY MOUTH TWICE A DAY   levothyroxine  (SYNTHROID ) 125 MCG tablet TAKE 1 TABLET BY MOUTH DAILY   methotrexate 250 MG/10ML injection Inject into the skin.   metoprolol  tartrate (LOPRESSOR ) 50 MG tablet TAKE 1 TABLET BY MOUTH TWICE A DAY   sildenafil  (VIAGRA ) 50 MG tablet Take 1-2 tablets (50-100 mg total) by mouth daily as needed for erectile dysfunction.   tacrolimus (PROTOPIC) 0.1 % ointment Apply topically.   tadalafil  (CIALIS ) 10 MG tablet TAKE 1 TABLET BY MOUTH DAILY   triamcinolone cream (KENALOG) 0.1 %    TUBERCULIN SYR 1CC/27GX1/2 27G X 1/2 1 ML MISC Inject into the skin.   zolpidem  (AMBIEN ) 10 MG tablet TAKE 1 AND 1/2 TABLETS BY MOUTH AT BEDTIME AS NEEDED FOR SLEEP   No facility-administered medications prior to visit.   Review of Systems  Constitutional:  Negative for appetite change, chills and fever.  Respiratory:  Negative for shortness of breath.   Cardiovascular:  Negative for chest pain.  Gastrointestinal:  Positive for abdominal pain. Negative for blood in stool, nausea and vomiting.       Objective    BP 139/77   Pulse (!) 57   Ht 5' 11 (1.803 m)  Wt 217 lb (98.4 kg)   SpO2 97%   BMI 30.27 kg/m   Physical Exam   General Appearance:    Well developed, well nourished male, alert, cooperative, in no acute distress  Eyes:    PERRL, conjunctiva/corneas clear, EOM's intact       Lungs:     Clear to auscultation bilaterally, respirations unlabored  Heart:    Bradycardic. Normal rhythm. No murmurs, rubs, or gallops.    Abdomen:   bowel sounds present and normal in all 4 quadrants, soft, or nontender. No CVA tenderness. Mild tenderness left lower abdomen into left inguinal area and testicle. No rebound or guarding.   GU:   Small bilateral inguinal hernias. Normal descended testicles. Very tender left epididymis. No masses.no erythema.  Inguinal pain reproduced with palpation of left testicle. SABRA      Results for orders placed or performed in visit on 04/28/24  POCT Urinalysis Dipstick  Result Value Ref Range   Color, UA yellow    Clarity, UA clear    Glucose, UA Negative Negative   Bilirubin, UA small    Ketones, UA neg    Spec Grav, UA >=1.030 (A) 1.010 - 1.025   Blood, UA trace    pH, UA 6.0 5.0 - 8.0   Protein, UA Negative (A) Negative   Urobilinogen, UA 0.2 (A) 0.2 or 1.0 E.U./dL   Nitrite, UA neg    Leukocytes, UA Negative Negative   Appearance     Odor       Assessment & Plan     1. Left lower quadrant abdominal pain (Primary) Associated with left inguinal and testicular pain as below. He does have bilateral inguinal hernias but tenderness is more intense at left epididymis.   2. Left testicular pain C/with left epididymitis  3. Epididymitis (suspected)  - levofloxacin  (LEVAQUIN ) 750 MG tablet; Take 1 tablet (750 mg total) by mouth daily for 7 days.  Dispense: 7 tablet; Refill: 0   Consider abd/pelvic CT if not  rapidly improving, or go to ER if sx worsen.       Nancyann Perry, MD  [facility] [phone] (phone) [phone] (fax)  [facility]

## 2024-05-17 DIAGNOSIS — L4050 Arthropathic psoriasis, unspecified: Secondary | ICD-10-CM | POA: Diagnosis not present

## 2024-05-17 DIAGNOSIS — Z79899 Other long term (current) drug therapy: Secondary | ICD-10-CM | POA: Diagnosis not present

## 2024-05-17 DIAGNOSIS — L409 Psoriasis, unspecified: Secondary | ICD-10-CM | POA: Diagnosis not present

## 2024-06-01 ENCOUNTER — Other Ambulatory Visit: Payer: Self-pay | Admitting: Family Medicine

## 2024-06-01 DIAGNOSIS — M47816 Spondylosis without myelopathy or radiculopathy, lumbar region: Secondary | ICD-10-CM | POA: Diagnosis not present

## 2024-06-01 DIAGNOSIS — L4050 Arthropathic psoriasis, unspecified: Secondary | ICD-10-CM | POA: Diagnosis not present

## 2024-06-01 DIAGNOSIS — Z87891 Personal history of nicotine dependence: Secondary | ICD-10-CM | POA: Diagnosis not present

## 2024-06-01 DIAGNOSIS — G610 Guillain-Barre syndrome: Secondary | ICD-10-CM | POA: Diagnosis not present

## 2024-06-01 DIAGNOSIS — A498 Other bacterial infections of unspecified site: Secondary | ICD-10-CM | POA: Diagnosis not present

## 2024-06-01 DIAGNOSIS — Z23 Encounter for immunization: Secondary | ICD-10-CM | POA: Diagnosis not present

## 2024-06-01 DIAGNOSIS — I10 Essential (primary) hypertension: Secondary | ICD-10-CM | POA: Diagnosis not present

## 2024-06-01 DIAGNOSIS — M19072 Primary osteoarthritis, left ankle and foot: Secondary | ICD-10-CM | POA: Diagnosis not present

## 2024-06-01 DIAGNOSIS — M51369 Other intervertebral disc degeneration, lumbar region without mention of lumbar back pain or lower extremity pain: Secondary | ICD-10-CM | POA: Diagnosis not present

## 2024-06-01 DIAGNOSIS — M5030 Other cervical disc degeneration, unspecified cervical region: Secondary | ICD-10-CM | POA: Diagnosis not present

## 2024-06-01 DIAGNOSIS — Z7185 Encounter for immunization safety counseling: Secondary | ICD-10-CM | POA: Diagnosis not present

## 2024-06-01 DIAGNOSIS — K5792 Diverticulitis of intestine, part unspecified, without perforation or abscess without bleeding: Secondary | ICD-10-CM | POA: Diagnosis not present

## 2024-06-01 DIAGNOSIS — M19071 Primary osteoarthritis, right ankle and foot: Secondary | ICD-10-CM | POA: Diagnosis not present

## 2024-06-01 DIAGNOSIS — E039 Hypothyroidism, unspecified: Secondary | ICD-10-CM

## 2024-06-12 NOTE — Progress Notes (Signed)
 Keith Carpenter                                          MRN: [id number]   06/12/2024   The [facility]I Quality Team Specialist reviewed this patient medical record for the purposes of chart review for care gap closure. The following were reviewed: chart review for care gap closure-kidney health evaluation for diabetes:eGFR  and uACR.    VBCI Quality Team

## 2024-06-19 NOTE — Progress Notes (Signed)
 Keith Carpenter                                          MRN: [id number]   06/19/2024   The VBCI Quality Team Specialist reviewed this patient medical record for the purposes of chart review for care gap closure. The following were reviewed: chart review for care gap closure-diabetic eye exam.    VBCI Quality Team

## 2024-06-21 ENCOUNTER — Ambulatory Visit

## 2024-06-26 DIAGNOSIS — S99922A Unspecified injury of left foot, initial encounter: Secondary | ICD-10-CM | POA: Diagnosis not present

## 2024-06-27 ENCOUNTER — Ambulatory Visit (INDEPENDENT_AMBULATORY_CARE_PROVIDER_SITE_OTHER)

## 2024-06-27 DIAGNOSIS — Z0000 Encounter for general adult medical examination without abnormal findings: Secondary | ICD-10-CM

## 2024-06-27 NOTE — Progress Notes (Signed)
 Subjective:   Keith Carpenter is a 68 y.o. who presents for a Medicare Wellness preventive visit.  As a reminder, Annual Wellness Visits don't include a physical exam, and some assessments may be limited, especially if this visit is performed virtually. We may recommend an in-person follow-up visit with your provider if needed.  Visit Complete: Virtual I connected with  Keith Carpenter on 06/27/24 by a audio enabled telemedicine application and verified that I am speaking with the correct person using two identifiers.  Patient Location: Home  Provider Location: Office/Clinic  I discussed the limitations of evaluation and management by telemedicine. The patient expressed understanding and agreed to proceed.  Vital Signs: Because this visit was a virtual/telehealth visit, some criteria may be missing or patient reported. Any vitals not documented were not able to be obtained and vitals that have been documented are patient reported.  VideoDeclined- This patient declined Librarian, academic. Therefore the visit was completed with audio only.  Persons Participating in Visit: Patient.  AWV Questionnaire: No: Patient Medicare AWV questionnaire was not completed prior to this visit.  Cardiac Risk Factors include: advanced age (>91men, >89 women);male gender;dyslipidemia;hypertension;sedentary lifestyle     Objective:    Today's Vitals   06/27/24 0848  PainSc: 4    There is no height or weight on file to calculate BMI.     06/27/2024    8:58 AM 05/03/2024    9:27 AM 03/31/2019    9:04 AM 08/26/2017    5:00 AM 08/25/2017    4:10 PM 07/31/2017   12:36 PM 11/09/2016    5:05 PM  Advanced Directives  Does Patient Have a Medical Advance Directive? No Yes Yes Yes  Yes  No  Yes   Type of Forensic scientist Power of [employer] Power of Attorney  Healthcare Power of Attorney   Does patient want to make changes to medical advance directive?    No - Patient declined  No - Patient declined   No - Patient declined   Copy of Healthcare Power of Attorney in Chart?    No - copy requested  No - copy requested   No - copy requested   Would patient like information on creating a medical advance directive? No - Patient declined           Data saved with a previous flowsheet row definition    Current Medications (verified) Outpatient Encounter Medications as of 06/27/2024  Medication Sig   amLODipine  (NORVASC ) 5 MG tablet TAKE 1 TABLET BY MOUTH DAILY   atorvastatin  (LIPITOR) 10 MG tablet TAKE 1 TABLET BY MOUTH EVERY EVENING   Cholecalciferol (VITAMIN D3) 50 MCG (2000 UT) capsule Take 2,000 Units by mouth daily.   clobetasol ointment (TEMOVATE) 0.05 % Apply twice daily to active areas on your elbows until smooth then stop.   folic acid (FOLVITE) 1 MG tablet Take 1 mg by mouth 2 (two) times daily.   gabapentin  (NEURONTIN ) 300 MG capsule TAKE ONE CAPSULE BY MOUTH TWICE A DAY   levothyroxine  (SYNTHROID ) 125 MCG tablet TAKE 1 TABLET BY MOUTH DAILY   methotrexate 250 MG/10ML injection Inject into the skin.   metoprolol  tartrate (LOPRESSOR ) 50 MG tablet TAKE 1 TABLET BY MOUTH TWICE A DAY   sildenafil  (VIAGRA ) 50 MG tablet Take 1-2 tablets (50-100 mg total) by mouth daily as needed for erectile dysfunction.   tadalafil  (CIALIS ) 10 MG tablet TAKE 1 TABLET  BY MOUTH DAILY   triamcinolone cream (KENALOG) 0.1 %    TUBERCULIN SYR 1CC/27GX1/2 27G X 1/2 1 ML MISC Inject into the skin.   zolpidem  (AMBIEN ) 10 MG tablet TAKE 1 AND 1/2 TABLETS BY MOUTH AT BEDTIME AS NEEDED FOR SLEEP   tacrolimus (PROTOPIC) 0.1 % ointment Apply topically.   No facility-administered encounter medications on file as of 06/27/2024.    Allergies (verified) Influenza vaccines, Ixekizumab, Pregabalin, Declomycin [demeclocycline], Demeclocycline hcl, Glipizide, Metformin and related, Other, Pneumococcal  vaccines, Prednisone , Remicade [infliximab], Suvorexant, Clarithromycin , and Pioglitazone    History: Past Medical History:  Diagnosis Date   Collagen vascular disease    RA diagnosed in his 65's   Diabetes mellitus without complication (HCC)    Gastric ulcer    H/O Guillain-Barre syndrome    Helicobacter pylori gastritis treated 07/2017   Hypertension    Hypocalcemia    Panic disorder    Schamberg disease    Thoracic aortic aneurysm 04/2014   4.4 cm [facility] at [facility]   Thyroid  cancer [facility]) 2009   Past Surgical History:  Procedure Laterality Date   CARDIAC CATHETERIZATION  06/07/2014   [facility]   COLONOSCOPY     COLONOSCOPY WITH PROPOFOL  N/A 03/31/2019   Procedure: COLONOSCOPY WITH PROPOFOL ;  Surgeon: Therisa Bi, MD;  Location: [facility] ENDOSCOPY;  Service: Gastroenterology;  Laterality: N/A;   ESOPHAGOGASTRODUODENOSCOPY (EGD) WITH PROPOFOL  N/A 03/31/2019   Procedure: ESOPHAGOGASTRODUODENOSCOPY (EGD) WITH PROPOFOL ;  Surgeon: Therisa Bi, MD;  Location: [facility] ENDOSCOPY;  Service: Gastroenterology;  Laterality: N/A;   MENISCUS REPAIR Right    THYROIDECTOMY  2009   Family History  Problem Relation Age of Onset   Hyperlipidemia Mother    Heart attack Father 36   Hypertension Father    Hyperlipidemia Father    Heart failure Father    Prostate cancer Neg Hx    Kidney cancer Neg Hx    Bladder Cancer Neg Hx    Kidney disease Neg Hx    Social History   Socioeconomic History   Marital status: Divorced    Spouse name: Not on file   Number of children: Not on file   Years of education: Not on file   Highest education level: Master's degree (e.g., MA, MS, MEng, MEd, MSW, MBA)  Occupational History   Not on file  Tobacco Use   Smoking status: Never   Smokeless tobacco: Never  Vaping Use   Vaping status: Never Used  Substance and Sexual Activity   Alcohol use: No   Drug use: No   Sexual activity: Not on file  Other Topics Concern   Not on file  Social History Narrative   Not on  file   Social Drivers of Health   Financial Resource Strain: Low Risk  (06/27/2024)   Overall Financial Resource Strain (CARDIA)    Difficulty of Paying Living Expenses: Not hard at all  Food Insecurity: No Food Insecurity (06/27/2024)   Hunger Vital Sign    Worried About Running Out of Food in the Last Year: Never true    Ran Out of Food in the Last Year: Never true  Transportation Needs: No Transportation Needs (06/27/2024)   PRAPARE - Administrator, Civil Service (Medical): No    Lack of Transportation (Non-Medical): No  Physical Activity: Inactive (06/27/2024)   Exercise Vital Sign    Days of Exercise per Week: 0 days    Minutes of Exercise per Session: 0 min  Stress: No Stress Concern Present (06/27/2024)   [place]  [employer] - Occupational Stress Questionnaire    Feeling of Stress: Not at all  Recent Concern: Stress - Stress Concern Present (06/26/2024)   [employer] of Occupational Health - Occupational Stress Questionnaire    Feeling of Stress: To some extent  Social Connections: Socially Isolated (06/27/2024)   Social Connection and Isolation Panel    Frequency of Communication with Friends and Family: More than three times a week    Frequency of Social Gatherings with Friends and Family: Patient declined    Attends Religious Services: Patient declined    Database administrator or Organizations: Patient declined    Attends Banker Meetings: Never    Marital Status: Divorced    Tobacco Counseling Counseling given: Not Answered    Clinical Intake:  Pre-visit preparation completed: Yes  Pain : 0-10 Pain Score: 4  Pain Type: Acute pain Pain Location: Foot Pain Orientation: Left Pain Descriptors / Indicators: Aching, Constant Pain Onset: 1 to 4 weeks ago Pain Frequency: Constant     BMI - recorded: 29 Nutritional Status: BMI 25 -29 Overweight Nutritional Risks: None Diabetes: No  Lab Results   Component Value Date   HGBA1C 6.1 (H) 02/28/2024   HGBA1C 6.2 (H) 05/03/2023   HGBA1C 5.9 (H) 11/23/2022     How often do you need to have someone help you when you read instructions, pamphlets, or other written materials from your doctor or pharmacy?: 1 - Never  Interpreter Needed?: No  Information entered by :: JHONNIE DAS, LPN   Activities of Daily Living     06/27/2024    9:00 AM 06/23/2024    9:32 AM  In your present state of health, do you have any difficulty performing the following activities:  Hearing? 0 0  Vision? 0 0  Difficulty concentrating or making decisions? 0 1  Walking or climbing stairs? 0 0  Dressing or bathing? 0 0  Doing errands, shopping? 0 0  Preparing Food and eating ? N N  Using the Toilet? N N  In the past six months, have you accidently leaked urine? N N  Do you have problems with loss of bowel control? N N  Managing your Medications? N N  Managing your Finances? N N  Housekeeping or managing your Housekeeping? N N    Patient Care Team: Gasper Nancyann BRAVO, MD as PCP - General (Family Medicine) Darron Deatrice LABOR, MD as Consulting Physician (Cardiology) Hester Alm BROCKS, MD (Dermatology) Nicholes Kemps as Referring Physician (Dermatology) Alpha Chloe SAUNDERS, MD as Referring Physician (Dermatology) [place], [facility] [place] Blanch, Mayur K, MD as Consulting Physician (Rheumatology)  I have updated your Care Teams any recent Medical Services you may have received from other providers in the past year.     Assessment:   This is a routine wellness examination for Keith Carpenter.  Hearing/Vision screen Hearing Screening - Comments:: NO AIDS Vision Screening - Comments:: WEARS GLASSES ALL DAY- [place]- HAS APPT COMING UP   Goals Addressed             This Visit's Progress    DIET - EAT MORE FRUITS AND VEGETABLES         Depression Screen     06/27/2024    8:56 AM 02/28/2024    9:46 AM 05/03/2023    3:09 PM 12/25/2021    2:46 PM  09/10/2020   11:30 AM 11/15/2019    8:28 AM 07/17/2019    3:45 PM  PHQ 2/9 Scores  PHQ - 2 Score 0 1 0 0 0 2 0  PHQ- 9 Score 0 10  5 8 10 4     Fall Risk     06/27/2024    8:59 AM 06/23/2024    9:32 AM 06/20/2024    5:38 PM 02/28/2024    9:46 AM 05/03/2023    3:09 PM  Fall Risk   Falls in the past year? 1 0 0 0 1  Number falls in past yr: 0 0 0 0 0  Injury with Fall? 1 0 0 0 1  Risk for fall due to :    No Fall Risks History of fall(s)  Follow up Falls evaluation completed;Falls prevention discussed    Falls evaluation completed    MEDICARE RISK AT HOME:  Medicare Risk at Home Any stairs in or around the home?: No If so, are there any without handrails?: No Home free of loose throw rugs in walkways, pet beds, electrical cords, etc?: Yes Adequate lighting in your home to reduce risk of falls?: Yes Life alert?: No Use of a cane, walker or w/c?: No Grab bars in the bathroom?: Yes Shower chair or bench in shower?: Yes Elevated toilet seat or a handicapped toilet?: Yes  TIMED UP AND GO:  Was the test performed?  No  Cognitive Function: 6CIT completed        06/27/2024    9:03 AM  6CIT Screen  What Year? 0 points  What month? 0 points  What time? 0 points  Count back from 20 0 points  Months in reverse 0 points  Repeat phrase 0 points  Total Score 0 points    Immunizations Immunization History  Administered Date(s) Administered   PFIZER(Purple Top)SARS-COV-2 Vaccination 11/22/2019, 12/13/2019, 09/05/2020   Pneumococcal Conjugate Pcv21, Polysaccharide Crm197 Conjugaf 06/01/2024   Rabies, IM 04/17/2015, 04/20/2015, 04/24/2015, 05/01/2015    Screening Tests Health Maintenance  Topic Date Due   DTaP/Tdap/Td (1 - Tdap) Never done   Zoster Vaccines- Shingrix (1 of 2) Never done   Colonoscopy  03/30/2022   Diabetic kidney evaluation - Urine ACR  11/25/2022   OPHTHALMOLOGY EXAM  08/27/2023   COVID-19 Vaccine (4 - 2025-26 season) 05/15/2024   HEMOGLOBIN A1C   08/29/2024   Diabetic kidney evaluation - eGFR measurement  05/03/2025   Medicare Annual Wellness (AWV)  06/27/2025   Pneumococcal Vaccine: 50+ Years  Completed   Hepatitis C Screening  Completed   Meningococcal B Vaccine  Aged Out    Health Maintenance Items Addressed: CAN'T TAKE FLU OR PNA SHOTS; NEEDS TDAP & SHINGRIX; HAS APPT W/ MD ON 11/4 FOR COLONOSCOPY  Additional Screening:  Vision Screening: Recommended annual ophthalmology exams for early detection of glaucoma and other disorders of the eye. Is the patient up to date with their annual eye exam?  Yes  Who is the provider or what is the name of the office in which the patient attends annual eye exams? [facility]  Dental Screening: Recommended annual dental exams for proper oral hygiene  Community Resource Referral / Chronic Care Management: CRR required this visit?  No   CCM required this visit?  No   Plan:    I have personally reviewed and noted the following in the patient's chart:   Medical and social history Use of alcohol, tobacco or illicit drugs  Current medications and supplements including opioid prescriptions. Patient is not currently taking opioid prescriptions. Functional ability and status Nutritional status Physical activity Advanced directives List of other physicians Hospitalizations,  surgeries, and ER visits in previous 12 months Vitals Screenings to include cognitive, depression, and falls Referrals and appointments  In addition, I have reviewed and discussed with patient certain preventive protocols, quality metrics, and best practice recommendations. A written personalized care plan for preventive services as well as general preventive health recommendations were provided to patient.   Jhonnie GORMAN Das, LPN   [id number]   After Visit Summary: (MyChart) Due to this being a telephonic visit, the after visit summary with patients personalized plan was offered to patient via MyChart   Notes:  Nothing significant to report at this time.

## 2024-06-27 NOTE — Patient Instructions (Addendum)
 [name],  Thank you for taking the time for your Medicare Wellness Visit. I appreciate your continued commitment to your health goals. Please review the care plan we discussed, and feel free to reach out if I can assist you further.  Medicare recommends these wellness visits once per year to help you and your care team stay ahead of potential health issues. These visits are designed to focus on prevention, allowing your provider to concentrate on managing your acute and chronic conditions during your regular appointments.  Please note that Annual Wellness Visits do not include a physical exam. Some assessments may be limited, especially if the visit was conducted virtually. If needed, we may recommend a separate in-person follow-up with your provider.  Ongoing Care Seeing your primary care provider every 3 to 6 months helps us  monitor your health and provide consistent, personalized care.   Referrals If a referral was made during today's visit and you haven't received any updates within two weeks, please contact the referred provider directly to check on the status.  Recommended Screenings:  Health Maintenance  Topic Date Due   DTaP/Tdap/Td vaccine (1 - Tdap) Never done   Zoster (Shingles) Vaccine (1 of 2) Never done   Colon Cancer Screening  03/30/2022   Yearly kidney health urinalysis for diabetes  11/25/2022   Eye exam for diabetics  08/27/2023   COVID-19 Vaccine (4 - 2025-26 season) 05/15/2024   Hemoglobin A1C  08/29/2024   Yearly kidney function blood test for diabetes  05/03/2025   Medicare Annual Wellness Visit  06/27/2025   Pneumococcal Vaccine for age over 41  Completed   Hepatitis C Screening  Completed   Meningitis B Vaccine  Aged Out     Advance Care Planning is important because it: Ensures you receive medical care that aligns with your values, goals, and preferences. Provides guidance to your family and loved ones, reducing the emotional burden of decision-making  during critical moments.  Vision: Annual vision screenings are recommended for early detection of glaucoma, cataracts, and diabetic retinopathy. These exams can also reveal signs of chronic conditions such as diabetes and high blood pressure.  Dental: Annual dental screenings help detect early signs of oral cancer, gum disease, and other conditions linked to overall health, including heart disease and diabetes.  Please see the attached documents for additional preventive care recommendations.   NEXT AWV 07/03/25 @ 8:50 AM BY VIDEO

## 2024-06-29 DIAGNOSIS — G629 Polyneuropathy, unspecified: Secondary | ICD-10-CM | POA: Diagnosis not present

## 2024-06-29 DIAGNOSIS — L4050 Arthropathic psoriasis, unspecified: Secondary | ICD-10-CM | POA: Diagnosis not present

## 2024-06-29 DIAGNOSIS — S93602A Unspecified sprain of left foot, initial encounter: Secondary | ICD-10-CM | POA: Diagnosis not present

## 2024-06-30 ENCOUNTER — Encounter: Payer: Self-pay | Admitting: Family Medicine

## 2024-06-30 ENCOUNTER — Ambulatory Visit (INDEPENDENT_AMBULATORY_CARE_PROVIDER_SITE_OTHER): Admitting: Family Medicine

## 2024-06-30 VITALS — BP 124/74 | HR 48 | Resp 16 | Ht 71.0 in | Wt 205.0 lb

## 2024-06-30 DIAGNOSIS — M5126 Other intervertebral disc displacement, lumbar region: Secondary | ICD-10-CM | POA: Diagnosis not present

## 2024-06-30 DIAGNOSIS — G629 Polyneuropathy, unspecified: Secondary | ICD-10-CM | POA: Diagnosis not present

## 2024-06-30 DIAGNOSIS — E119 Type 2 diabetes mellitus without complications: Secondary | ICD-10-CM

## 2024-06-30 DIAGNOSIS — R001 Bradycardia, unspecified: Secondary | ICD-10-CM

## 2024-06-30 DIAGNOSIS — E1169 Type 2 diabetes mellitus with other specified complication: Secondary | ICD-10-CM

## 2024-06-30 DIAGNOSIS — E785 Hyperlipidemia, unspecified: Secondary | ICD-10-CM | POA: Diagnosis not present

## 2024-06-30 DIAGNOSIS — L4050 Arthropathic psoriasis, unspecified: Secondary | ICD-10-CM | POA: Diagnosis not present

## 2024-06-30 DIAGNOSIS — E039 Hypothyroidism, unspecified: Secondary | ICD-10-CM | POA: Diagnosis not present

## 2024-06-30 DIAGNOSIS — N529 Male erectile dysfunction, unspecified: Secondary | ICD-10-CM | POA: Diagnosis not present

## 2024-06-30 LAB — POCT GLYCOSYLATED HEMOGLOBIN (HGB A1C)
Est. average glucose Bld gHb Est-mCnc: 123
Hemoglobin A1C: 5.9 % — AB (ref 4.0–5.6)

## 2024-06-30 NOTE — Progress Notes (Signed)
 Established patient visit   Patient: Keith Carpenter   DOB: Nov 17, 1955   68 y.o. Male  MRN: [id number] Visit Date: 06/30/2024  Today's healthcare provider: Nancyann Perry, MD   Chief Complaint  Patient presents with   Medical Management of Chronic Issues   Subjective    Discussed the use of AI scribe software for clinical note transcription with the patient, who gave verbal consent to proceed.  History of Present Illness   Keith Carpenter is a 68 year old male with diabetes, hyperlipidemia, hypertension, hypothyroid, ED, and psoriatic arthritis who presents for routine follow up and reporting complications following a pneumococcal vaccine.  He experienced flu-like symptoms within a few hours after receiving a pneumococcal vaccine, followed by a loss of vision in his left eye for four days and extreme weakness. He later discovered that the vaccine was a biologic, which he was not supposed to take.  He sustained a foot injury after a fall and was evaluated by a podiatrist. He was prescribed a temporary course of prednisone , with the last dose scheduled for tomorrow.  He is actively managing his weight and diabetes, reporting a weight loss from 217 lbs to 205 lbs, with a goal of reaching 195 lbs. His A1c has improved to 5.9 from a previous 6.1.  He has ongoing issues with erectile dysfunction and has tried multiple medications, including Cialis  and sildenafil  (Viagra ), with limited success. He reports some success with Cialis  but none with Viagra .  He experiences dizziness and lightheadedness, which he attributes to dietary changes aimed at weight loss and sugar control. He had an EKG in the emergency room and a scan to evaluate his aneurysm.  His blood pressure is reportedly well-controlled, but he notes a low heart rate, recorded at 48 bpm, which has been consistent over recent checks. He associates dizziness with dietary changes.     Lab Results  Component  Value Date   CHOL 148 02/28/2024   HDL 35 (L) 02/28/2024   LDLCALC 95 02/28/2024   TRIG 93 02/28/2024   CHOLHDL 4.2 02/28/2024   Lab Results  Component Value Date   NA 138 05/03/2024   K 3.9 05/03/2024   CREATININE 1.11 05/03/2024   GFRNONAA >60 05/03/2024   GLUCOSE 165 (H) 05/03/2024   Lab Results  Component Value Date   TSH 2.470 02/28/2024     Medications: Outpatient Medications Prior to Visit  Medication Sig   amLODipine  (NORVASC ) 5 MG tablet TAKE 1 TABLET BY MOUTH DAILY   atorvastatin  (LIPITOR) 10 MG tablet TAKE 1 TABLET BY MOUTH EVERY EVENING   Cholecalciferol (VITAMIN D3) 50 MCG (2000 UT) capsule Take 2,000 Units by mouth daily.   clobetasol ointment (TEMOVATE) 0.05 % Apply twice daily to active areas on your elbows until smooth then stop.   folic acid (FOLVITE) 1 MG tablet Take 1 mg by mouth 2 (two) times daily.   gabapentin  (NEURONTIN ) 300 MG capsule TAKE ONE CAPSULE BY MOUTH TWICE A DAY   levothyroxine  (SYNTHROID ) 125 MCG tablet TAKE 1 TABLET BY MOUTH DAILY   methotrexate 250 MG/10ML injection Inject into the skin.   metoprolol  tartrate (LOPRESSOR ) 50 MG tablet TAKE 1 TABLET BY MOUTH TWICE A DAY   sildenafil  (VIAGRA ) 50 MG tablet Take 1-2 tablets (50-100 mg total) by mouth daily as needed for erectile dysfunction.   tadalafil  (CIALIS ) 10 MG tablet TAKE 1 TABLET BY MOUTH DAILY   triamcinolone cream (KENALOG) 0.1 %    TUBERCULIN SYR  1CC/27GX1/2 27G X 1/2 1 ML MISC Inject into the skin.   zolpidem  (AMBIEN ) 10 MG tablet TAKE 1 AND 1/2 TABLETS BY MOUTH AT BEDTIME AS NEEDED FOR SLEEP   tacrolimus (PROTOPIC) 0.1 % ointment Apply topically.   No facility-administered medications prior to visit.        Objective    BP 124/74 (BP Location: Left Arm, Patient Position: Sitting)   Pulse (!) 48   Resp 16   Ht 5' 11 (1.803 m)   Wt 205 lb (93 kg)   SpO2 97%   BMI 28.59 kg/m   Physical Exam   General appearance: Well developed, well nourished male, cooperative  and in no acute distress Head: Normocephalic, without obvious abnormality, atraumatic Respiratory: Respirations even and unlabored, normal respiratory rate Extremities: All extremities are intact.  Skin: Skin color, texture, turgor normal. No rashes seen  Psych: Appropriate mood and affect. Neurologic: Mental status: Alert, oriented to person, place, and time, thought content appropriate.   Results for orders placed or performed in visit on 06/30/24  POCT glycosylated hemoglobin (Hb A1C)  Result Value Ref Range   Hemoglobin A1C 5.9 (A) 4.0 - 5.6 %   Est. average glucose Bld gHb Est-mCnc 123      Assessment & Plan    1. Hyperlipidemia associated with type 2 diabetes mellitus (HCC) (Primary) A1c at goal. Atorvastatin    - Urine Albumin/Creatinine with ratio (send out) [LAB689]  2. Hypothyroidism, unspecified type Euthyroid on current thyroid  replacement.   3. Bradycardia No sign of heart block on most recent EKG. Let me know if HR drops below 45.   4. Erectile dysfunction, unspecified erectile dysfunction type No affect with sildenafil . Minimal with tadalafil . A bit more when he took 50mg  sildenafil  and 10mg  tadalafil  together. Discussed trial of Levitra, which he will consider.   5. Psoriatic arthritis (HCC) Fairly well controlled on current regiment. Continue routine follow up dermatology and rheumatology.   6. Lumbar herniated disc Pain fairly well controlled off of opioids. Continue current medications.    7. Neuropathy Continue current dose of gabapentin .   Return in about 8 months (around 02/28/2025) for Yearly Physical.     Nancyann Perry, MD  [facility] [phone] (phone) [phone] (fax)  [facility]

## 2024-06-30 NOTE — Patient Instructions (Signed)
 SABRA  Please review the attached list of medications and notify my office if there are any errors.   . Please bring all of your medications to every appointment so we can make sure that our medication list is the same as yours.

## 2024-07-01 LAB — MICROALBUMIN / CREATININE URINE RATIO
Creatinine, Urine: 56.2 mg/dL
Microalb/Creat Ratio: 16 mg/g{creat} (ref 0–29)
Microalbumin, Urine: 9.1 ug/mL

## 2024-07-18 DIAGNOSIS — Z09 Encounter for follow-up examination after completed treatment for conditions other than malignant neoplasm: Secondary | ICD-10-CM | POA: Diagnosis not present

## 2024-07-18 DIAGNOSIS — Z8719 Personal history of other diseases of the digestive system: Secondary | ICD-10-CM | POA: Diagnosis not present

## 2024-07-18 DIAGNOSIS — Z860100 Personal history of colon polyps, unspecified: Secondary | ICD-10-CM | POA: Diagnosis not present

## 2024-08-01 ENCOUNTER — Encounter

## 2024-08-02 ENCOUNTER — Other Ambulatory Visit: Payer: Self-pay | Admitting: Family Medicine

## 2024-08-02 DIAGNOSIS — G4700 Insomnia, unspecified: Secondary | ICD-10-CM

## 2024-08-03 DIAGNOSIS — L4050 Arthropathic psoriasis, unspecified: Secondary | ICD-10-CM | POA: Diagnosis not present

## 2024-08-03 DIAGNOSIS — L409 Psoriasis, unspecified: Secondary | ICD-10-CM | POA: Diagnosis not present

## 2024-08-16 DIAGNOSIS — Z79899 Other long term (current) drug therapy: Secondary | ICD-10-CM | POA: Diagnosis not present

## 2024-08-16 DIAGNOSIS — L409 Psoriasis, unspecified: Secondary | ICD-10-CM | POA: Diagnosis not present

## 2024-08-16 DIAGNOSIS — L209 Atopic dermatitis, unspecified: Secondary | ICD-10-CM | POA: Diagnosis not present

## 2024-08-16 DIAGNOSIS — L4050 Arthropathic psoriasis, unspecified: Secondary | ICD-10-CM | POA: Diagnosis not present

## 2024-08-16 DIAGNOSIS — R21 Rash and other nonspecific skin eruption: Secondary | ICD-10-CM | POA: Diagnosis not present

## 2024-08-18 DIAGNOSIS — Z79899 Other long term (current) drug therapy: Secondary | ICD-10-CM | POA: Diagnosis not present

## 2024-08-29 ENCOUNTER — Encounter

## 2024-09-01 ENCOUNTER — Ambulatory Visit: Admission: RE | Admit: 2024-09-01 | Source: Home / Self Care | Admitting: Gastroenterology

## 2024-09-01 ENCOUNTER — Encounter: Admission: RE | Payer: Self-pay | Source: Home / Self Care

## 2024-09-01 SURGERY — COLONOSCOPY
Anesthesia: General

## 2024-09-11 ENCOUNTER — Ambulatory Visit: Admitting: Registered Nurse

## 2024-09-11 ENCOUNTER — Encounter: Payer: Self-pay | Admitting: Gastroenterology

## 2024-09-11 ENCOUNTER — Encounter
Admission: RE | Disposition: A | Discharge status: Home / Self Care | Payer: Self-pay | Source: Home / Self Care | Attending: Gastroenterology

## 2024-09-11 ENCOUNTER — Other Ambulatory Visit: Payer: Self-pay

## 2024-09-11 ENCOUNTER — Ambulatory Visit
Admission: RE | Admit: 2024-09-11 | Discharge: 2024-09-11 | Disposition: A | Discharge status: Home / Self Care | Attending: Gastroenterology | Admitting: Gastroenterology

## 2024-09-11 DIAGNOSIS — I10 Essential (primary) hypertension: Secondary | ICD-10-CM | POA: Insufficient documentation

## 2024-09-11 DIAGNOSIS — K635 Polyp of colon: Secondary | ICD-10-CM | POA: Insufficient documentation

## 2024-09-11 DIAGNOSIS — E119 Type 2 diabetes mellitus without complications: Secondary | ICD-10-CM | POA: Insufficient documentation

## 2024-09-11 DIAGNOSIS — M069 Rheumatoid arthritis, unspecified: Secondary | ICD-10-CM | POA: Diagnosis not present

## 2024-09-11 DIAGNOSIS — K279 Peptic ulcer, site unspecified, unspecified as acute or chronic, without hemorrhage or perforation: Secondary | ICD-10-CM | POA: Insufficient documentation

## 2024-09-11 DIAGNOSIS — Z8711 Personal history of peptic ulcer disease: Secondary | ICD-10-CM | POA: Diagnosis not present

## 2024-09-11 DIAGNOSIS — F419 Anxiety disorder, unspecified: Secondary | ICD-10-CM | POA: Diagnosis not present

## 2024-09-11 DIAGNOSIS — K5730 Diverticulosis of large intestine without perforation or abscess without bleeding: Secondary | ICD-10-CM | POA: Insufficient documentation

## 2024-09-11 DIAGNOSIS — Z1211 Encounter for screening for malignant neoplasm of colon: Secondary | ICD-10-CM | POA: Insufficient documentation

## 2024-09-11 DIAGNOSIS — I499 Cardiac arrhythmia, unspecified: Secondary | ICD-10-CM | POA: Insufficient documentation

## 2024-09-11 DIAGNOSIS — D120 Benign neoplasm of cecum: Secondary | ICD-10-CM | POA: Diagnosis not present

## 2024-09-11 DIAGNOSIS — F32A Depression, unspecified: Secondary | ICD-10-CM | POA: Diagnosis not present

## 2024-09-11 DIAGNOSIS — E039 Hypothyroidism, unspecified: Secondary | ICD-10-CM | POA: Insufficient documentation

## 2024-09-11 DIAGNOSIS — D122 Benign neoplasm of ascending colon: Secondary | ICD-10-CM | POA: Insufficient documentation

## 2024-09-11 DIAGNOSIS — Z87891 Personal history of nicotine dependence: Secondary | ICD-10-CM | POA: Insufficient documentation

## 2024-09-11 HISTORY — PX: POLYPECTOMY: SHX149

## 2024-09-11 HISTORY — PX: COLONOSCOPY: SHX5424

## 2024-09-11 SURGERY — COLONOSCOPY
Anesthesia: General

## 2024-09-11 MED ORDER — SODIUM CHLORIDE 0.9 % IV SOLN: INTRAVENOUS | Status: DC

## 2024-09-11 MED ORDER — PROPOFOL 500 MG/50ML IV EMUL
INTRAVENOUS | Status: DC | PRN
  Administered 2024-09-11: 150 ug/kg/min via INTRAVENOUS

## 2024-09-11 MED ORDER — DEXMEDETOMIDINE HCL IN NACL 80 MCG/20ML IV SOLN
INTRAVENOUS | Status: DC | PRN
  Administered 2024-09-11: 8 ug via INTRAVENOUS

## 2024-09-11 MED ORDER — PROPOFOL 10 MG/ML IV BOLUS
INTRAVENOUS | Status: DC | PRN
  Administered 2024-09-11: 100 mg via INTRAVENOUS

## 2024-09-11 MED ORDER — LIDOCAINE HCL (CARDIAC) PF 100 MG/5ML IV SOSY
PREFILLED_SYRINGE | INTRAVENOUS | Status: DC | PRN
  Administered 2024-09-11: 40 mg via INTRAVENOUS

## 2024-09-11 MED ORDER — PHENYLEPHRINE 80 MCG/ML (10ML) SYRINGE FOR IV PUSH (FOR BLOOD PRESSURE SUPPORT)
PREFILLED_SYRINGE | INTRAVENOUS | Status: DC | PRN
  Administered 2024-09-11 (×2): 160 ug via INTRAVENOUS

## 2024-09-11 NOTE — Op Note (Signed)
 [facility] Gastroenterology Patient Name: Keith Carpenter Procedure Date: 09/11/2024 9:48 AM MRN: [id number] Account #: [id number] Date of Birth: Dec 10, 1955 Admit Type: Outpatient Age: 68 Room: [facility] ENDO ROOM 1 Gender: Male Note Status: Finalized Instrument Name: Colon Scope [id number] Procedure:             Colonoscopy Indications:           Surveillance: Personal history of adenomatous polyps                         on last colonoscopy > 3 years ago, Last colonoscopy:                         July 2020 Providers:             Ruel Kung MD, MD Referring MD:          Nancyann BRAVO. Gasper, MD (Referring MD) Medicines:             Monitored Anesthesia Care Complications:         No immediate complications. Procedure:             Pre-Anesthesia Assessment:                        - Prior to the procedure, a History and Physical was                         performed, and patient medications, allergies and                         sensitivities were reviewed. The patient's tolerance                         of previous anesthesia was reviewed.                        - The risks and benefits of the procedure and the                         sedation options and risks were discussed with the                         patient. All questions were answered and informed                         consent was obtained.                        - ASA Grade Assessment: II - A patient with mild                         systemic disease.                        After obtaining informed consent, the colonoscope was                         passed under direct vision. Throughout the procedure,                         the  patient's blood pressure, pulse, and oxygen                         saturations were monitored continuously. The                         Colonoscope was introduced through the anus and                         advanced to the the cecum, identified by the                          appendiceal orifice. The colonoscopy was performed                         with ease. The patient tolerated the procedure well.                         The quality of the bowel preparation was excellent.                         The ileocecal valve, appendiceal orifice, and rectum                         were photographed. Findings:      The perianal and digital rectal examinations were normal.      Multiple medium-mouthed diverticula were found in the sigmoid colon.      Three sessile polyps were found in the sigmoid colon, ascending colon       and cecum. The polyps were 4 to 6 mm in size. These polyps were removed       with a cold snare. Resection and retrieval were complete.      The exam was otherwise without abnormality on direct and retroflexion       views. Impression:            - Diverticulosis in the sigmoid colon.                        - Three 4 to 5 mm polyps in the sigmoid colon, in the                         ascending colon and in the cecum, removed with a cold                         snare. Resected and retrieved.                        - The examination was otherwise normal on direct and                         retroflexion views. Recommendation:        - Discharge patient to home (with escort).                        - Resume previous diet.                        - Continue present medications.                        -  Await pathology results.                        - Repeat colonoscopy in 3 - 5 years for surveillance                         based on pathology results. Procedure Code(s):     --- Professional ---                        [phone], Colonoscopy, flexible; with removal of                         tumor(s), polyp(s), or other lesion(s) by snare                         technique Diagnosis Code(s):     --- Professional ---                        Z86.010, Personal history of colonic polyps                        D12.8, Benign neoplasm of rectum                         D12.2, Benign neoplasm of ascending colon                        D12.0, Benign neoplasm of cecum                        K57.30, Diverticulosis of large intestine without                         perforation or abscess without bleeding CPT copyright 2022 American Medical Association. All rights reserved. The codes documented in this report are preliminary and upon coder review may  be revised to meet current compliance requirements. Ruel Kung, MD Ruel Kung MD, MD 09/11/2024 10:25:14 AM This report has been signed electronically. Number of Addenda: 0 Note Initiated On: 09/11/2024 9:48 AM Scope Withdrawal Time: 0 hours 11 minutes 32 seconds  Total Procedure Duration: 0 hours 14 minutes 10 seconds  Estimated Blood Loss:  Estimated blood loss: none. Estimated blood loss: none.      [facility]

## 2024-09-11 NOTE — H&P (Signed)
 "  Ruel Kung , MD [address], [address], [place], [place], [address] Phone: [phone] Fax: [phone]  Primary Care Physician:  Gasper Nancyann BRAVO, MD   Pre-Procedure History & Physical: HPI:  Keith Carpenter is a 68 y.o. male is here for an colonoscopy.   Past Medical History:  Diagnosis Date   Collagen vascular disease    RA diagnosed in his 1's   Diabetes mellitus without complication (HCC)    Gastric ulcer    H/O Guillain-Barre syndrome    Helicobacter pylori gastritis treated 07/2017   Hypertension    Hypocalcemia    Panic disorder    Schamberg disease    Thoracic aortic aneurysm 04/2014   4.4 cm followed at [facility]   Thyroid  cancer [facility]) 2009    Past Surgical History:  Procedure Laterality Date   CARDIAC CATHETERIZATION  06/07/2014   [facility]   COLONOSCOPY     COLONOSCOPY WITH PROPOFOL  N/A 03/31/2019   Procedure: COLONOSCOPY WITH PROPOFOL ;  Surgeon: Kung Ruel, MD;  Location: [facility] ENDOSCOPY;  Service: Gastroenterology;  Laterality: N/A;   ESOPHAGOGASTRODUODENOSCOPY (EGD) WITH PROPOFOL  N/A 03/31/2019   Procedure: ESOPHAGOGASTRODUODENOSCOPY (EGD) WITH PROPOFOL ;  Surgeon: Kung Ruel, MD;  Location: [facility] ENDOSCOPY;  Service: Gastroenterology;  Laterality: N/A;   MENISCUS REPAIR Right    THYROIDECTOMY  2009    Prior to Admission medications  Medication Sig Start Date End Date Taking? Authorizing Provider  amLODipine  (NORVASC ) 5 MG tablet TAKE 1 TABLET BY MOUTH DAILY 03/23/24   Gasper Nancyann BRAVO, MD  atorvastatin  (LIPITOR) 10 MG tablet TAKE 1 TABLET BY MOUTH EVERY EVENING 10/15/23   Gasper Nancyann BRAVO, MD  Cholecalciferol (VITAMIN D3) 50 MCG (2000 UT) capsule Take 2,000 Units by mouth daily.    [provider]  clobetasol ointment (TEMOVATE) 0.05 % Apply twice daily to active areas on your elbows until smooth then stop. 07/16/20   [provider]  folic acid (FOLVITE) 1 MG tablet Take 1 mg by mouth 2 (two) times daily. 01/22/22   [provider]  gabapentin  (NEURONTIN ) 300 MG capsule TAKE ONE CAPSULE BY MOUTH TWICE A DAY 12/23/23   Gasper Nancyann BRAVO, MD  levothyroxine  (SYNTHROID ) 125 MCG tablet TAKE 1 TABLET BY MOUTH DAILY 06/01/24   Gasper Nancyann BRAVO, MD  methotrexate 250 MG/10ML injection Inject into the skin. 07/16/20   [provider]  metoprolol  tartrate (LOPRESSOR ) 50 MG tablet TAKE 1 TABLET BY MOUTH TWICE A DAY 03/01/24   Gasper Nancyann BRAVO, MD  sildenafil  (VIAGRA ) 50 MG tablet Take 1-2 tablets (50-100 mg total) by mouth daily as needed for erectile dysfunction. 05/03/23   Gasper Nancyann BRAVO, MD  tacrolimus (PROTOPIC) 0.1 % ointment Apply topically. 02/04/22   [provider]  tadalafil  (CIALIS ) 10 MG tablet TAKE 1 TABLET BY MOUTH DAILY 07/23/22   Gasper Nancyann BRAVO, MD  triamcinolone cream (KENALOG) 0.1 %  07/22/18   [provider]  TUBERCULIN SYR 1CC/27GX1/2 27G X 1/2 1 ML MISC Inject into the skin. 03/26/20   [provider]  zolpidem  (AMBIEN ) 10 MG tablet TAKE 1 AND 1/2 TABLETS BY MOUTH AT BEDTIME AS NEEDED FOR SLEEP 08/03/24   Gasper Nancyann BRAVO, MD    Allergies as of 08/28/2024 - Review Complete 06/30/2024  Allergen Reaction Noted   Influenza vaccines Other (See Comments) 09/27/2015   Ixekizumab Hives 10/14/2018   Pregabalin Itching, Rash, and Swelling 03/24/2021   Declomycin [demeclocycline]  06/04/2014   Demeclocycline hcl     Glipizide  01/04/2017   Metformin and related Nausea Only 08/18/2016   Other     Pneumococcal vaccines  06/27/2024   Prednisone   09/09/2019   Remicade [infliximab]     Suvorexant Other (See Comments) 07/12/2015   Clarithromycin  Rash 08/06/2017   Pioglitazone  Itching, Swelling, and Rash 01/14/2017    Family History  Problem Relation Age of Onset   Hyperlipidemia Mother    Heart attack Father 46   Hypertension Father    Hyperlipidemia Father    Heart failure Father    Prostate cancer Neg Hx    Kidney cancer Neg Hx    Bladder Cancer Neg Hx    Kidney disease  Neg Hx     Social History   Socioeconomic History   Marital status: Divorced    Spouse name: Not on file   Number of children: Not on file   Years of education: Not on file   Highest education level: Master's degree (e.g., MA, MS, MEng, MEd, MSW, MBA)  Occupational History   Not on file  Tobacco Use   Smoking status: Never   Smokeless tobacco: Never  Vaping Use   Vaping status: Never Used  Substance and Sexual Activity   Alcohol use: No   Drug use: No   Sexual activity: Not on file  Other Topics Concern   Not on file  Social History Narrative   Not on file   Social Drivers of Health   Tobacco Use: Medium Risk (08/16/2024)   Received from [facility]   Patient History    Smoking Tobacco Use: Never    Smokeless Tobacco Use: Former    Passive Exposure: Not on Actuary Strain: Low Risk  (07/18/2024)   Received from [facility]   Overall Financial Resource Strain (CARDIA)    Difficulty of Paying Living Expenses: Not hard at all  Food Insecurity: No Food Insecurity (07/18/2024)   Received from [facility]   Epic    Within the past 12 months, you worried that your food would run out before you got the money to buy more.: Never true    Within the past 12 months, the food you bought just didn't last and you didn't have money to get more.: Never true  Transportation Needs: No Transportation Needs (07/18/2024)   Received from [facility] - Transportation    In the past 12 months, has lack of transportation kept you from medical appointments or from getting medications?: No    Lack of Transportation (Non-Medical): No  Physical Activity: Inactive (06/27/2024)   Exercise Vital Sign    Days of Exercise per Week: 0 days    Minutes of Exercise per Session: 0 min  Stress: No Stress Concern Present (06/27/2024)   [employer] - Occupational Stress Questionnaire    Feeling of  Stress: Not at all  Recent Concern: Stress - Stress Concern Present (06/26/2024)   [employer] of [employer]ational [employer] - Occupational Stress Questionnaire    Feeling of Stress: To some extent  Social Connections: Socially Isolated (06/27/2024)   Social Connection and Isolation Panel    Frequency of Communication with Friends and Family: More than three times a week    Frequency of Social Gatherings with Friends and Family: Patient declined    Attends Religious Services: Patient declined    Database Administrator or Organizations: Patient declined    Attends Banker Meetings: Never  Marital Status: Divorced  Catering Manager Violence: Not At Risk (06/27/2024)   Epic    Fear of Current or Ex-Partner: No    Emotionally Abused: No    Physically Abused: No    Sexually Abused: No  Depression (PHQ2-9): Low Risk (06/27/2024)   Depression (PHQ2-9)    PHQ-2 Score: 0  Alcohol Screen: Low Risk (06/27/2024)   Alcohol Screen    Last Alcohol Screening Score (AUDIT): 0  Housing: Low Risk  (07/18/2024)   Received from [facility]   Epic    In the last 12 months, was there a time when you were not able to pay the mortgage or rent on time?: No    In the past 12 months, how many times have you moved where you were living?: 0    At any time in the past 12 months, were you homeless or living in a shelter (including now)?: No  Utilities: Not At Risk (07/18/2024)   Received from [facility]   Epic    In the past 12 months has the electric, gas, oil, or water company threatened to shut off services in your home?: No  Health Literacy: Not on file    Review of Systems: See HPI, otherwise negative ROS  Physical Exam: There were no vitals taken for this visit. General:   Alert,  pleasant and cooperative in NAD Head:  Normocephalic and atraumatic. Neck:  Supple; no masses or thyromegaly. Lungs:  Clear throughout to auscultation, normal respiratory  effort.    Heart:  +S1, +S2, Regular rate and rhythm, No edema. Abdomen:  Soft, nontender and nondistended. Normal bowel sounds, without guarding, and without rebound.   Neurologic:  Alert and  oriented x4;  grossly normal neurologically.  Impression/Plan: Emrah Ariola is here for an colonoscopy to be performed for surveillance due to prior history of colon polyps   Risks, benefits, limitations, and alternatives regarding  colonoscopy have been reviewed with the patient.  Questions have been answered.  All parties agreeable.   Ruel Kung, MD  09/11/2024, 9:31 AM  "

## 2024-09-11 NOTE — Anesthesia Postprocedure Evaluation (Signed)
"   Anesthesia Post Note  Patient: Keith Carpenter  Procedure(s) Performed: COLONOSCOPY POLYPECTOMY, INTESTINE  Patient location during evaluation: Endoscopy Anesthesia Type: General Level of consciousness: awake and alert Pain management: pain level controlled Vital Signs Assessment: post-procedure vital signs reviewed and stable Respiratory status: spontaneous breathing, nonlabored ventilation, respiratory function stable and patient connected to nasal cannula oxygen Cardiovascular status: blood pressure returned to baseline and stable Postop Assessment: no apparent nausea or vomiting Anesthetic complications: no   No notable events documented.   Last Vitals:  Vitals:   09/11/24 1023 09/11/24 1035  BP: (!) 97/43 108/69  Pulse: 75 70  Resp: 11 16  Temp: (!) 35.9 C   SpO2: 98% 99%    Last Pain:  Vitals:   09/11/24 1035  TempSrc:   PainSc: 0-No pain                 [name]      "

## 2024-09-11 NOTE — Anesthesia Preprocedure Evaluation (Signed)
 "                                  Anesthesia Evaluation  Patient identified by MRN, date of birth, ID band Patient awake    Reviewed: Allergy & Precautions, NPO status , Patient's Chart, lab work & pertinent test results  History of Anesthesia Complications Negative for: history of anesthetic complications  Airway Mallampati: III  TM Distance: >3 FB Neck ROM: full    Dental no notable dental hx. (+) Chipped   Pulmonary neg pulmonary ROS   Pulmonary exam normal        Cardiovascular hypertension, On Medications Normal cardiovascular exam+ dysrhythmias      Neuro/Psych  PSYCHIATRIC DISORDERS Anxiety Depression    negative neurological ROS     GI/Hepatic Neg liver ROS, PUD,,,  Endo/Other  diabetesHypothyroidism    Renal/GU negative Renal ROS  negative genitourinary   Musculoskeletal   Abdominal   Peds  Hematology negative hematology ROS (+)   Anesthesia Other Findings Past Medical History: No date: Collagen vascular disease     Comment:  RA diagnosed in his 30's No date: Diabetes mellitus without complication (HCC) No date: Gastric ulcer No date: H/O Guillain-Barre syndrome treated 07/2017: Helicobacter pylori gastritis No date: Hypertension No date: Hypocalcemia No date: Panic disorder No date: Schamberg disease 04/2014: Thoracic aortic aneurysm     Comment:  4.4 cm followed at [facility] 2009: Thyroid  cancer [facility])  Past Surgical History: 06/07/2014: CARDIAC CATHETERIZATION     Comment:  [facility] No date: COLONOSCOPY 03/31/2019: COLONOSCOPY WITH PROPOFOL ; N/A     Comment:  Procedure: COLONOSCOPY WITH PROPOFOL ;  Surgeon: Therisa Bi, MD;  Location: [facility] ENDOSCOPY;  Service:               Gastroenterology;  Laterality: N/A; 03/31/2019: ESOPHAGOGASTRODUODENOSCOPY (EGD) WITH PROPOFOL ; N/A     Comment:  Procedure: ESOPHAGOGASTRODUODENOSCOPY (EGD) WITH               PROPOFOL ;  Surgeon: Therisa Bi, MD;  Location: [facility]                ENDOSCOPY;  Service: Gastroenterology;  Laterality: N/A; No date: MENISCUS REPAIR; Right 2009: THYROIDECTOMY  BMI    Body Mass Index: 28.17 kg/m      Reproductive/Obstetrics negative OB ROS                              Anesthesia Physical Anesthesia Plan  ASA: 3  Anesthesia Plan: General   Post-op Pain Management:    Induction: Intravenous  PONV Risk Score and Plan: Propofol  infusion and TIVA  Airway Management Planned: Natural Airway and Nasal Cannula  Additional Equipment:   Intra-op Plan:   Post-operative Plan:   Informed Consent: I have reviewed the patients History and Physical, chart, labs and discussed the procedure including the risks, benefits and alternatives for the proposed anesthesia with the patient or authorized representative who has indicated his/her understanding and acceptance.     Dental Advisory Given  Plan Discussed with: Anesthesiologist, CRNA and Surgeon  Anesthesia Plan Comments: (Patient consented for risks of anesthesia including but not limited to:  - adverse reactions to medications - risk of airway placement if required - damage to eyes, teeth, lips or other oral mucosa - nerve damage due to positioning  -  sore throat or hoarseness - Damage to heart, brain, nerves, lungs, other parts of body or loss of life  Patient voiced understanding and assent.)        Anesthesia Quick Evaluation  "

## 2024-09-11 NOTE — Transfer of Care (Signed)
 Immediate Anesthesia Transfer of Care Note  Patient: Keith Carpenter  Procedure(s) Performed: Procedures: COLONOSCOPY (N/A) POLYPECTOMY, INTESTINE  Patient Location: PACU and Endoscopy Unit  Anesthesia Type:General  Level of Consciousness: sedated  Airway & Oxygen Therapy: Patient Spontanous Breathing and Patient connected to nasal cannula oxygen  Post-op Assessment: Report given to RN and Post -op Vital signs reviewed and stable  Post vital signs: Reviewed and stable  Last Vitals:  Vitals:   09/11/24 0938 09/11/24 1023  BP: (!) 162/81 (!) 97/43  Pulse: 79 75  Resp: 18 11  Temp: (!) 36.3 C (!) 35.9 C  SpO2: 99% 98%    Complications: No apparent anesthesia complications

## 2024-09-12 ENCOUNTER — Encounter: Payer: Self-pay | Admitting: Gastroenterology

## 2024-09-12 LAB — SURGICAL PATHOLOGY

## 2024-09-15 ENCOUNTER — Ambulatory Visit: Payer: Self-pay | Admitting: Gastroenterology

## 2024-10-04 ENCOUNTER — Other Ambulatory Visit: Payer: Self-pay | Admitting: Family Medicine

## 2024-10-04 DIAGNOSIS — I10 Essential (primary) hypertension: Secondary | ICD-10-CM

## 2024-12-29 ENCOUNTER — Encounter: Admitting: Family Medicine

## 2025-07-03 ENCOUNTER — Ambulatory Visit
